# Patient Record
Sex: Male | Born: 1970 | Race: White | Hispanic: No | Smoking: Former smoker
Health system: Southern US, Community
[De-identification: ages and names within clinical notes are randomized; demographics above are authoritative.]

## PROBLEM LIST (undated history)

## (undated) DIAGNOSIS — Z8619 Personal history of other infectious and parasitic diseases: Secondary | ICD-10-CM

## (undated) DIAGNOSIS — B001 Herpesviral vesicular dermatitis: Secondary | ICD-10-CM

## (undated) DIAGNOSIS — E785 Hyperlipidemia, unspecified: Secondary | ICD-10-CM

## (undated) DIAGNOSIS — I719 Aortic aneurysm of unspecified site, without rupture: Secondary | ICD-10-CM

## (undated) DIAGNOSIS — K219 Gastro-esophageal reflux disease without esophagitis: Secondary | ICD-10-CM

## (undated) DIAGNOSIS — F419 Anxiety disorder, unspecified: Secondary | ICD-10-CM

## (undated) DIAGNOSIS — Z7289 Other problems related to lifestyle: Secondary | ICD-10-CM

## (undated) DIAGNOSIS — N200 Calculus of kidney: Secondary | ICD-10-CM

## (undated) DIAGNOSIS — E119 Type 2 diabetes mellitus without complications: Secondary | ICD-10-CM

## (undated) HISTORY — DX: Calculus of kidney: N20.0

## (undated) HISTORY — DX: Anxiety disorder, unspecified: F41.9

## (undated) HISTORY — DX: Aortic aneurysm of unspecified site, without rupture: I71.9

## (undated) HISTORY — DX: Herpesviral vesicular dermatitis: B00.1

## (undated) HISTORY — DX: Gastro-esophageal reflux disease without esophagitis: K21.9

## (undated) HISTORY — DX: Personal history of other infectious and parasitic diseases: Z86.19

## (undated) HISTORY — DX: Type 2 diabetes mellitus without complications: E11.9

## (undated) HISTORY — PX: TONSILLECTOMY: SUR1361

## (undated) HISTORY — DX: Other problems related to lifestyle: Z72.89

## (undated) HISTORY — PX: FINGER SURGERY: SHX640

## (undated) HISTORY — DX: Hyperlipidemia, unspecified: E78.5

## (undated) HISTORY — PX: APPENDECTOMY: SHX54

---

## 2001-07-15 ENCOUNTER — Encounter: Admission: RE | Admit: 2001-07-15 | Discharge: 2001-10-13 | Payer: Self-pay | Admitting: Family Medicine

## 2003-01-10 ENCOUNTER — Encounter: Admission: RE | Admit: 2003-01-10 | Discharge: 2003-04-10 | Payer: Self-pay | Admitting: Family Medicine

## 2003-04-11 ENCOUNTER — Encounter: Admission: RE | Admit: 2003-04-11 | Discharge: 2003-07-10 | Payer: Self-pay | Admitting: Family Medicine

## 2003-12-28 ENCOUNTER — Encounter: Admission: RE | Admit: 2003-12-28 | Discharge: 2003-12-28 | Payer: Self-pay | Admitting: Occupational Medicine

## 2004-05-05 DIAGNOSIS — E119 Type 2 diabetes mellitus without complications: Secondary | ICD-10-CM

## 2004-05-05 HISTORY — DX: Type 2 diabetes mellitus without complications: E11.9

## 2004-10-09 ENCOUNTER — Encounter: Admission: RE | Admit: 2004-10-09 | Discharge: 2004-10-09 | Payer: Self-pay | Admitting: Family Medicine

## 2004-10-17 ENCOUNTER — Encounter: Admission: RE | Admit: 2004-10-17 | Discharge: 2004-10-17 | Payer: Self-pay | Admitting: Family Medicine

## 2005-03-02 ENCOUNTER — Emergency Department (HOSPITAL_COMMUNITY): Admission: EM | Admit: 2005-03-02 | Discharge: 2005-03-02 | Payer: Self-pay | Admitting: Emergency Medicine

## 2005-03-31 ENCOUNTER — Encounter: Admission: RE | Admit: 2005-03-31 | Discharge: 2005-03-31 | Payer: Self-pay | Admitting: Family Medicine

## 2009-03-26 ENCOUNTER — Ambulatory Visit (HOSPITAL_COMMUNITY): Admission: RE | Admit: 2009-03-26 | Discharge: 2009-03-26 | Payer: Self-pay | Admitting: Orthopedic Surgery

## 2010-08-07 LAB — BASIC METABOLIC PANEL
BUN: 16 mg/dL (ref 6–23)
CO2: 31 mEq/L (ref 19–32)
Calcium: 9.1 mg/dL (ref 8.4–10.5)
Chloride: 104 mEq/L (ref 96–112)
Creatinine, Ser: 0.94 mg/dL (ref 0.4–1.5)
GFR calc Af Amer: >60 mL/min (ref >60)
GFR calc non Af Amer: >60 mL/min (ref >60)
Glucose, Bld: 162 mg/dL — ABNORMAL HIGH (ref 70–99)
Potassium: 4.2 mEq/L (ref 3.5–5.1)
Sodium: 140 mEq/L (ref 135–145)

## 2010-08-07 LAB — CBC
HCT: 48 % (ref 39.0–52.0)
Hemoglobin: 16.9 g/dL (ref 13.0–17.0)
MCHC: 35.1 g/dL (ref 30.0–36.0)
MCV: 98.7 fL (ref 78.0–100.0)
Platelets: 160 10*3/uL (ref 150–400)
RBC: 4.87 MIL/uL (ref 4.22–5.81)
RDW: 12.5 % (ref 11.5–15.5)
WBC: 4.6 10*3/uL (ref 4.0–10.5)

## 2010-08-07 LAB — GLUCOSE, CAPILLARY
Glucose-Capillary: 107 mg/dL — ABNORMAL HIGH (ref 70–99)
Glucose-Capillary: 108 mg/dL — ABNORMAL HIGH (ref 70–99)
Glucose-Capillary: 136 mg/dL — ABNORMAL HIGH (ref 70–99)

## 2012-10-09 DIAGNOSIS — F339 Major depressive disorder, recurrent, unspecified: Secondary | ICD-10-CM | POA: Insufficient documentation

## 2012-10-09 DIAGNOSIS — E7800 Pure hypercholesterolemia, unspecified: Secondary | ICD-10-CM | POA: Insufficient documentation

## 2012-10-09 DIAGNOSIS — E119 Type 2 diabetes mellitus without complications: Secondary | ICD-10-CM | POA: Insufficient documentation

## 2012-10-09 DIAGNOSIS — F411 Generalized anxiety disorder: Secondary | ICD-10-CM | POA: Insufficient documentation

## 2014-03-20 DIAGNOSIS — Z23 Encounter for immunization: Secondary | ICD-10-CM | POA: Insufficient documentation

## 2015-02-23 LAB — HM DIABETES EYE EXAM

## 2015-05-17 ENCOUNTER — Ambulatory Visit (INDEPENDENT_AMBULATORY_CARE_PROVIDER_SITE_OTHER): Payer: BLUE CROSS/BLUE SHIELD | Admitting: Endocrinology

## 2015-05-17 ENCOUNTER — Other Ambulatory Visit: Payer: Self-pay

## 2015-05-17 ENCOUNTER — Encounter: Payer: Self-pay | Admitting: Endocrinology

## 2015-05-17 VITALS — BP 110/74 | HR 53 | Temp 98.3°F | Ht 67.0 in | Wt 182.0 lb

## 2015-05-17 DIAGNOSIS — E785 Hyperlipidemia, unspecified: Secondary | ICD-10-CM | POA: Diagnosis not present

## 2015-05-17 DIAGNOSIS — E1165 Type 2 diabetes mellitus with hyperglycemia: Secondary | ICD-10-CM

## 2015-05-17 MED ORDER — ONETOUCH ULTRA 2 W/DEVICE KIT: PACK | Status: DC

## 2015-05-17 MED ORDER — GLIPIZIDE ER 5 MG PO TB24: 5.0000 mg | ORAL_TABLET | Freq: Every day | ORAL | Status: DC

## 2015-05-17 MED ORDER — GLUCOSE BLOOD VI STRP: ORAL_STRIP | Status: DC

## 2015-05-17 MED ORDER — SAXAGLIPTIN HCL 5 MG PO TABS: 5.0000 mg | ORAL_TABLET | Freq: Every day | ORAL | Status: DC

## 2015-05-17 MED ORDER — CANAGLIFLOZIN 100 MG PO TABS: 100.0000 mg | ORAL_TABLET | Freq: Every day | ORAL | Status: DC

## 2015-05-17 NOTE — Patient Instructions (Addendum)
Check blood sugars on waking up 3  times a week Also check blood sugars about 2 hours after a meal and do this after different meals by rotation  Recommended blood sugar levels on waking up is 90-130 and about 2 hours after meal is 130-160  Please bring your blood sugar monitor to each visit, thank you  When sugar < 100 drop Glipizide to 5mg   Walk daily

## 2015-05-17 NOTE — Progress Notes (Signed)
Patient ID: Andrew Hart, male   DOB: [birth year], 45 y.o.   MRN: [id number]           Reason for Appointment: Consultation for Type 2 Diabetes   History of Present Illness:          Date of diagnosis of type 2 diabetes mellitus: 2007 ?        Background history:  He was diagnosed with routine screening lab work He was initially tried on metformin but this caused diarrhea and he could not continue this Subsequently was treated with glipizide and apparently this was able to control his blood sugars fairly well for a few years Probably in early 2016 he was given Actos in addition to help his glucose control as A1c had gone up to 9.4 This helped transiently and A1c was 7.7 However his A1c has been otherwise over 8% since mid 2015  Recent history:   He is being seen here today in consultation because of persistently high blood sugars His Actos has been increased to 45 mg recently but his blood sugars are not improving and last A1c was 9.1  Current blood sugar patterns and problems identified:    he is checking his blood sugar very sporadically and mostly in the morning.  Also he thinks he has test strips from previous prescriptions and not clear if these are expired   His blood sugars are usually around 150 in the morning but as high as 226  He does not do any formal exercise  Has not been following any diet and tends to have relatively high fat meals especially in the evening  Non-insulin hypoglycemic drugs the patient is taking are: Glipizide ER 10 mg daily, Actos 45 mg daily      Side effects from medications have been: Diarrhea from metformin  Compliance with the medical regimen: Fair Hypoglycemia: None    Glucose monitoring:  done 0-1  times a day         Glucometer: Accu-Chek     Blood Glucose readings as above  Self-care: The diet that the patient has been following is: tries to limit sweets and drinks which sugar .     Typical meal intake: Breakfast is a breakfast bar  on weekdays, eggs, bacon and grits on weekends Lunch = sandwich, soup Dinner = pizza, chicken and pasta.  Snacks will be potato chips, nuts and cheese               Dietician visit, most recent: At diagnosis               Exercise:  none, somewhat active when he is working  Weight history: usual Previous weight 170   Wt Readings from Last 3 Encounters:  05/17/15 182 lb (82.555 kg)    Glycemic control:   No results found for: HGBA1C Lab Results  Component Value Date   CREATININE 0.94 03/22/2009         Medication List       This list is accurate as of: 05/17/15  4:21 PM.  Always use your most recent med list.               ACID REDUCER PO  Take by mouth.     aspirin 81 MG tablet  Take 81 mg by mouth daily.     canagliflozin 100 MG Tabs tablet  Commonly known as:  INVOKANA  Take 1 tablet (100 mg total) by mouth daily before breakfast.  glipiZIDE 5 MG 24 hr tablet  Commonly known as:  GLUCOTROL XL  Take 1 tablet (5 mg total) by mouth daily with breakfast.     glucose blood test strip  Commonly known as:  ONE TOUCH ULTRA TEST  Use to check blood sugar 1 time per day.     ONE TOUCH ULTRA 2 w/Device Kit  Use to check blood sugar 1 time per day.     pioglitazone 45 MG tablet  Commonly known as:  ACTOS  Take 45 mg by mouth daily.     saxagliptin HCl 5 MG Tabs tablet  Commonly known as:  ONGLYZA  Take 1 tablet (5 mg total) by mouth daily.     simvastatin 80 MG tablet  Commonly known as:  ZOCOR  Take 80 mg by mouth daily.     traZODone 100 MG tablet  Commonly known as:  DESYREL  Take 100 mg by mouth at bedtime.        Allergies: No Known Allergies  Past Medical History  Diagnosis Date  . Diabetes mellitus without complication ([place])   . Hyperlipidemia     No past surgical history on file.  Family History  Problem Relation Age of Onset  . Diabetes Mother   . Heart disease Father     MI at 22, CABG  . Diabetes Maternal Grandmother      Social History:  reports that he has quit smoking. He does not have any smokeless tobacco history on file. He reports that he drinks alcohol. His drug history is not on file.    Review of Systems    Lipid history: LDL 81, HDL 43.  Has been on simvastatin for treatment for some time   No results found for: CHOL, HDL, LDLCALC, LDLDIRECT, TRIG, CHOLHDL         Hypertension: Not present  Most recent eye exam was in 04/2015  Most recent foot exam: 1/17  Review of Systems  Constitutional: Positive for weight gain and malaise.  HENT: Negative for headaches.   Eyes: Negative for blurred vision.  Respiratory: Negative for shortness of breath.   Cardiovascular: Negative for leg swelling.  Gastrointestinal: Positive for constipation.       Has had some constipation in the last few weeks  Endocrine: Positive for abnormal weight gain. Negative for light-headedness.  Musculoskeletal: Negative for joint pain, muscle aches and muscle cramps.  Skin: Negative for rash.  Neurological: Negative for numbness and tingling.  Psychiatric/Behavioral: Negative for insomnia.    Has had weight gain after quitting smoking October 2016  LABS:    Physical Examination:  BP 110/74 mmHg  Pulse 53  Temp(Src) 98.3 F (36.8 C) (Oral)  Ht '5\' 7"'  (1.702 m)  Wt 182 lb (82.555 kg)  BMI 28.50 kg/m2  SpO2 97%  GENERAL:         Patient has minimal obesity.   HEENT:         Eye exam shows normal external appearance. Fundus exam shows no retinopathy.  Oral exam shows normal mucosa .  NECK:   There is no lymphadenopathy Thyroid is not enlarged and no nodules felt.  Carotids are normal to palpation and no bruit heard LUNGS:         Chest is symmetrical. Lungs are clear to auscultation.Marland Kitchen   HEART:         Heart sounds:  S1 and S2 are normal. No murmur or click heard., no S3 or S4.   ABDOMEN:   There  is no distention present. Liver and spleen are not palpable. No other mass or tenderness present.    NEUROLOGICAL:   Ankle jerks are absent bilaterally.    Diabetic Foot Exam - Simple   Simple Foot Form  Diabetic Foot exam was performed with the following findings:  Yes   Visual Inspection  No deformities, no ulcerations, no other skin breakdown bilaterally:  Yes  Sensation Testing  Intact to touch and monofilament testing bilaterally:  Yes  Pulse Check  Posterior Tibialis and Dorsalis pulse intact bilaterally:  Yes  See comments:  Yes  Comments  Absent left posterior tibialis.  Left anterior tibialis difficult to palpate             Vibration sense is moderately  reduced in distal first toes. MUSCULOSKELETAL:  There is no swelling or deformity of the peripheral joints. Spine is normal to inspection.   EXTREMITIES:     There is no edema. No skin lesions present.Marland Kitchen SKIN:       No rash or lesions of concern.        ASSESSMENT:  Diabetes type 2, uncontrolled, BMI 28   He has had persistently poor controlled blood sugars for at least 1-1/2 years with using glipizide and Actos May have to some degree of insulin deficiency However he has also gained weight more recently He has not had any consultation with  dietitian since diagnosis and needs improvement in his meal plan as well as more general diabetes education Needs to start structured exercise program with walking at least  Complications: None evident  History of hyperlipidemia, well controlled  PLAN:    Trial of Invokana 100 mg daily. Discussed action of SGLT 2 drugs on lowering glucose by decreasing kidney absorption of glucose, benefits of weight loss and lower blood pressure, possible side effects including UTI, candidiasis and taking the medication in the morning   Onglyza 5 mg daily.  Discussed mechanism of action and benefits  Consultation with dietitian  Start walking program  Start using One Touch ultra 2 monitor, prescription for test strips and meter given, he may have been using expired test strips at  home  Check blood sugars periodically after meals and every other day or so in the morning.  New One Touch 2 monitor prescribed.  Follow-up in 3 weeks  May need to reduce glipizide to 5 mg when blood sugars are below 100, discussed possibly tapering this off long-term  Patient Instructions  Check blood sugars on waking up 3  times a week Also check blood sugars about 2 hours after a meal and do this after different meals by rotation  Recommended blood sugar levels on waking up is 90-130 and about 2 hours after meal is 130-160  Please bring your blood sugar monitor to each visit, thank you  When sugar < 100 drop Glipizide to 66m  Walk daily    Jayro Mcmath 05/17/2015, 4:21 PM   Note: This office note was prepared with Dragon voice recognition system technology. Any transcriptional errors that result from this process are unintentional.

## 2015-05-18 DIAGNOSIS — E1169 Type 2 diabetes mellitus with other specified complication: Secondary | ICD-10-CM | POA: Insufficient documentation

## 2015-05-18 DIAGNOSIS — E785 Hyperlipidemia, unspecified: Secondary | ICD-10-CM | POA: Insufficient documentation

## 2015-05-18 DIAGNOSIS — E1165 Type 2 diabetes mellitus with hyperglycemia: Secondary | ICD-10-CM | POA: Insufficient documentation

## 2015-06-04 ENCOUNTER — Other Ambulatory Visit (INDEPENDENT_AMBULATORY_CARE_PROVIDER_SITE_OTHER): Payer: BLUE CROSS/BLUE SHIELD

## 2015-06-04 DIAGNOSIS — E1165 Type 2 diabetes mellitus with hyperglycemia: Secondary | ICD-10-CM

## 2015-06-04 LAB — COMPREHENSIVE METABOLIC PANEL
ALT: 22 U/L (ref 0–53)
AST: 19 U/L (ref 0–37)
Albumin: 4.3 g/dL (ref 3.5–5.2)
Alkaline Phosphatase: 78 U/L (ref 39–117)
BILIRUBIN TOTAL: 0.5 mg/dL (ref 0.2–1.2)
BUN: 17 mg/dL (ref 6–23)
CALCIUM: 9.1 mg/dL (ref 8.4–10.5)
CO2: 25 meq/L (ref 19–32)
Chloride: 105 mEq/L (ref 96–112)
Creatinine, Ser: 0.99 mg/dL (ref 0.40–1.50)
GFR: 86.76 mL/min (ref >60.00)
Glucose, Bld: 149 mg/dL — ABNORMAL HIGH (ref 70–99)
Potassium: 4.8 mEq/L (ref 3.5–5.1)
Sodium: 138 mEq/L (ref 135–145)
Total Protein: 6.8 g/dL (ref 6.0–8.3)

## 2015-06-05 LAB — FRUCTOSAMINE: Fructosamine: 310 umol/L — ABNORMAL HIGH (ref 0–285)

## 2015-06-07 ENCOUNTER — Encounter: Payer: BLUE CROSS/BLUE SHIELD | Attending: Endocrinology | Admitting: Dietician

## 2015-06-07 ENCOUNTER — Telehealth: Payer: Self-pay | Admitting: Endocrinology

## 2015-06-07 ENCOUNTER — Encounter: Payer: Self-pay | Admitting: Dietician

## 2015-06-07 ENCOUNTER — Encounter: Payer: Self-pay | Admitting: Endocrinology

## 2015-06-07 ENCOUNTER — Ambulatory Visit (INDEPENDENT_AMBULATORY_CARE_PROVIDER_SITE_OTHER): Payer: BLUE CROSS/BLUE SHIELD | Admitting: Endocrinology

## 2015-06-07 ENCOUNTER — Other Ambulatory Visit: Payer: Self-pay | Admitting: *Deleted

## 2015-06-07 VITALS — BP 110/72 | HR 64 | Temp 98.0°F | Resp 16 | Ht 67.0 in | Wt 180.6 lb

## 2015-06-07 VITALS — Ht 67.0 in | Wt 180.0 lb

## 2015-06-07 DIAGNOSIS — E1165 Type 2 diabetes mellitus with hyperglycemia: Secondary | ICD-10-CM | POA: Diagnosis not present

## 2015-06-07 DIAGNOSIS — E118 Type 2 diabetes mellitus with unspecified complications: Secondary | ICD-10-CM

## 2015-06-07 DIAGNOSIS — IMO0002 Reserved for concepts with insufficient information to code with codable children: Secondary | ICD-10-CM

## 2015-06-07 DIAGNOSIS — Z794 Long term (current) use of insulin: Secondary | ICD-10-CM

## 2015-06-07 MED ORDER — BAYER MICROLET LANCETS MISC: Status: DC

## 2015-06-07 MED ORDER — GLUCOSE BLOOD VI STRP: ORAL_STRIP | Status: DC

## 2015-06-07 MED ORDER — BAYER CONTOUR NEXT MONITOR W/DEVICE KIT: PACK | Status: DC

## 2015-06-07 NOTE — Telephone Encounter (Signed)
Pt needs a rx for the test meter and strips the contour next

## 2015-06-07 NOTE — Patient Instructions (Signed)
Plan:  Aim for 60 Carb Choices per meal (60 grams) +/- 1 either way  Aim for 0-2 Carbs per snack if hungry  Include protein in moderation with your meals and snacks Consider reading food labels for Total Carbohydrate and Fat Grams of foods Consider  increasing your activity level by walking for 30 minutes daily as tolerated Consider checking BG at alternate times per day as directed by MD  Consider taking medication as directed by MD

## 2015-06-07 NOTE — Progress Notes (Signed)
Patient ID: Andrew Hart, male   DOB: 14-Mar-1971, 45 y.o.   MRN: [id number]           Reason for Appointment: Consultation for Type 2 Diabetes   History of Present Illness:          Date of diagnosis of type 2 diabetes mellitus: 2007 ?        Background history:  He was diagnosed with routine screening lab work He was initially tried on metformin but this caused diarrhea and he could not continue this Subsequently was treated with glipizide and apparently this was able to control his blood sugars fairly well for a few years Probably in early 2016 he was given Actos in addition to help his glucose control as A1c had gone up to 9.4 This helped transiently and A1c was 7.7 However his A1c has been otherwise over 8% since mid 2015  Recent history:    Because of persistently high blood sugars on his initial consultation (A1c was 9.1) he was started on Invokana and Onglyza in addition to his Actos and glipizide in 1/16  He has not brought his monitor for download today  Current blood sugar patterns and problems identified:   He is checking his blood sugars mostly in the morning again  He thinks his morning sugars are progressively better and down to 95 today  Has not checked his nonfasting readings recently but a few days ago it was over 200 in the afternoon  Postprandial glucose after breakfast was 149  He has seen the dietitian, previously had been eating relatively high fat meals  He has not had any side effects from [place] except more frequent urination and thirst during the day  Has lost 2 pounds   He does not do any formal exercise   He was told to reduce his glipizide ER down to mg once blood sugars are significantly better and he has done that a few days ago   Non-insulin hypoglycemic drugs the patient is taking are: Glipizide ER 5 mg daily, Actos 45 mg daily      Side effects from medications have been: Diarrhea from metformin  Compliance with the medical  regimen: Fair Hypoglycemia: None but right at suppertime he may occasionally feels shaky     Glucose monitoring:  done 0-1  times a day         Glucometer:  Generic    Blood Glucose readings  by recall  Mean values apply above for all meters except median for One Touch  PRE-MEAL Fasting Lunch Dinner Bedtime Overall  Glucose range: 95, 140  206    Mean/median:        Self-care: The diet that the patient has been following is: tries to limit sweets and drinks which sugar .     Typical meal intake: Breakfast is a breakfast bar on weekdays, eggs, bacon and grits on weekends Lunch = sandwich, soup Dinner = pizza, chicken and pasta.  Snacks will be potato chips, nuts and cheese               Dietician visit, most recent: At diagnosis               Exercise:  none, somewhat active when he is working  Weight history: usual Previous weight 170   Wt Readings from Last 3 Encounters:  06/07/15 180 lb 9.6 oz (81.92 kg)  06/07/15 180 lb (81.647 kg)  05/17/15 182 lb (82.555 kg)    Glycemic control:  No results found for: HGBA1C Lab Results  Component Value Date   CREATININE 0.99 06/04/2015    Lab on 06/04/2015  Component Date Value Ref Range Status  . Sodium 06/04/2015 138  135 - 145 mEq/L Final  . Potassium 06/04/2015 4.8  3.5 - 5.1 mEq/L Final  . Chloride 06/04/2015 105  96 - 112 mEq/L Final  . CO2 06/04/2015 25  19 - 32 mEq/L Final  . Glucose, Bld 06/04/2015 149* 70 - 99 mg/dL Final  . BUN 06/04/2015 17  6 - 23 mg/dL Final  . Creatinine, Ser 06/04/2015 0.99  0.40 - 1.50 mg/dL Final  . Total Bilirubin 06/04/2015 0.5  0.2 - 1.2 mg/dL Final  . Alkaline Phosphatase 06/04/2015 78  39 - 117 U/L Final  . AST 06/04/2015 19  0 - 37 U/L Final  . ALT 06/04/2015 22  0 - 53 U/L Final  . Total Protein 06/04/2015 6.8  6.0 - 8.3 g/dL Final  . Albumin 06/04/2015 4.3  3.5 - 5.2 g/dL Final  . Calcium 06/04/2015 9.1  8.4 - 10.5 mg/dL Final  . GFR 06/04/2015 86.76  >60.00 mL/min Final  .  Fructosamine 06/04/2015 310* 0 - 285 umol/L Final   Comment: Published reference interval for apparently healthy subjects between age 89 and 31 is 10 - 285 umol/L and in a poorly controlled diabetic population is 228 - 563 umol/L with a mean of 396 umol/L.         Medication List       This list is accurate as of: 06/07/15  3:26 PM.  Always use your most recent med list.               ACID REDUCER PO  Take by mouth.     acyclovir 200 MG capsule  Commonly known as:  ZOVIRAX  Take 200 mg by mouth as needed.     aspirin 81 MG tablet  Take 81 mg by mouth daily.     BAYER CONTOUR NEXT MONITOR w/Device Kit  Use to check blood sugar once a day     BAYER MICROLET LANCETS lancets  Use as instructed to check blood sugar once a day dx E11.65     canagliflozin 100 MG Tabs tablet  Commonly known as:  INVOKANA  Take 1 tablet (100 mg total) by mouth daily before breakfast.     glipiZIDE 5 MG 24 hr tablet  Commonly known as:  GLUCOTROL XL  Take 1 tablet (5 mg total) by mouth daily with breakfast.     glucose blood test strip  Commonly known as:  BAYER CONTOUR NEXT TEST  Use as instructed to check blood sugar once a day dx code E11.65     pioglitazone 45 MG tablet  Commonly known as:  ACTOS  Take 45 mg by mouth daily. Reported on 06/07/2015     saxagliptin HCl 5 MG Tabs tablet  Commonly known as:  ONGLYZA  Take 1 tablet (5 mg total) by mouth daily.     simvastatin 80 MG tablet  Commonly known as:  ZOCOR  Take 80 mg by mouth daily.     traZODone 150 MG tablet  Commonly known as:  DESYREL  Take by mouth at bedtime. Takes 1/2 tablet at night        Allergies: No Known Allergies  Past Medical History  Diagnosis Date  . Diabetes mellitus without complication ([place])   . Hyperlipidemia     No past surgical history on file.  Family History  Problem Relation Age of Onset  . Diabetes Mother   . Heart disease Father     MI at 36, CABG  . Diabetes Maternal Grandmother      Social History:  reports that he has quit smoking. He does not have any smokeless tobacco history on file. He reports that he drinks alcohol. His drug history is not on file.    Review of Systems    Lipid history: LDL 81, HDL 43.  Has been on simvastatin for treatment for some time   No results found for: CHOL, HDL, LDLCALC, LDLDIRECT, TRIG, CHOLHDL         Hypertension: Not present  Most recent eye exam was in 04/2015  Most recent foot exam: 1/17  Review of Systems  Has had weight gain after quitting smoking October 2016     Physical Examination:  BP 110/72 mmHg  Pulse 64  Temp(Src) 98 F (36.7 C)  Resp 16  Ht _0  (1.702 m)  Wt 180 lb 9.6 oz (81.92 kg)  BMI 28.28 kg/m2  SpO2 96%  ASSESSMENT:  Diabetes type 2, uncontrolled, BMI 28   He has had persistently poor controlled blood sugars for at least 1-1/2 years with using glipizide and Actos With starting Onglyza and Invokana his blood sugars appear to be improving progressively He has now started taking 5 mg glipizide also instead of 10 Fructosamine is still mildly increased   PLAN:    continue same regimen  He will check with his insurance company which meter they prefer  Discussed timing of glucose monitoring and frequency   Check A1c on the next visit  Patient Instructions  Check blood sugars on waking up  2-3 times a week Also check blood sugars about 2 hours after a meal and do this after different meals by rotation  Recommended blood sugar levels on waking up is 90-130 and about 2 hours after meal is 130-160  Please bring your blood sugar monitor to each visit, thank you  Call if sugars get <80   Brands: Contour, Accucheck, Freestyle    Rilea Arutyunyan 06/07/2015, 3:26 PM   Note: This office note was prepared with Dragon voice recognition system technology. Any transcriptional errors that result from this process are unintentional.

## 2015-06-07 NOTE — Patient Instructions (Addendum)
Check blood sugars on waking up  2-3 times a week Also check blood sugars about 2 hours after a meal and do this after different meals by rotation  Recommended blood sugar levels on waking up is 90-130 and about 2 hours after meal is 130-160  Please bring your blood sugar monitor to each visit, thank you  Call if sugars get <80   Brands: Contour, Accucheck, Freestyle

## 2015-06-07 NOTE — Telephone Encounter (Signed)
rx sent

## 2015-06-07 NOTE — Telephone Encounter (Signed)
Please call to [facility]

## 2015-06-08 NOTE — Progress Notes (Signed)
Diabetes Self-Management Education  Visit Type: First/Initial  Appt. Start Time: 1100 Appt. End Time: 1215  06/08/2015  [name], identified by name and date of birth, is a 45 y.o. male with a diagnosis of Diabetes: Type 2 (for 10 years). He quit smoking in August 2016.  He has gained 10 lbs since that time.  He does not want to go on insulin.  His fructosamine was 310 (06/04/15).  Patient lives with his wife and 56 and 61 yo daughters.  He does the shopping and cooking.  He reports just receiving a "Slow Cooker Recipes with Diabetes" cookbook.  They eat out twice per week.  He modifies recipes for fat at times.  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  He does not get any regular exercise outside of the physical activity of work.  ASSESSMENT  Height  (1.702 m), weight 180 lb (81.647 kg). Body mass index is 28.19 kg/(m^2).       Diabetes Self-Management Education - 06/07/15 1104    Visit Information   Visit Type First/Initial   Initial Visit   Diabetes Type Type 2  for 10 years   Are you currently following a meal plan? Yes   Are you taking your medications as prescribed? Yes   Date Diagnosed 2006   Health Coping   How would you rate your overall health? Good   Psychosocial Assessment   Patient Belief/Attitude about Diabetes Motivated to manage diabetes   Self-care barriers None   Self-management support Doctor's office;Family   Other persons present Patient   Patient Concerns Nutrition/Meal planning;Glycemic Control   Special Needs None   Preferred Learning Style No preference indicated   Learning Readiness Ready   How often do you need to have someone help you when you read instructions, pamphlets, or other written materials from your doctor or pharmacy? 1 - Never   What is the last grade level you completed in school? 2 college   Complications   Last HgB A1C per patient/outside source 9.4 %  12/16   How often do  you check your blood sugar? 1-2 times/day   Fasting Blood glucose range (mg/dL) 95-621;308-657   Postprandial Blood glucose range (mg/dL) >846   Number of hypoglycemic episodes per month 0   Number of hyperglycemic episodes per week 14   Can you tell when your blood sugar is high? Yes   What do you do if your blood sugar is high? nothing   Have you had a dilated eye exam in the past 12 months? Yes   Have you had a dental exam in the past 12 months? Yes   Are you checking your feet? Yes   How many days per week are you checking your feet? 7   Dietary Intake   Breakfast Breakfast bar or biscuit or fruit OR bacon, eggs, biscuit or sausage and gravy biscuit  8:15   Snack (morning) occasional trail mix   Lunch leftovers or cold cut sandwich or chicken salad sandwich or grilled cheese on White wheat bread with chips or cheeseits OR cheeseburger and fries  11:30-3:30   Snack (afternoon) snacks while cooking (fruit, chips), occasional candy bar if feels that blood sugar is low.   Dinner Chartered certified accountant, corn or peas, grean beans, salad or Pizza once per week  6:30-7   Snack (evening) none   Beverage(s) water, diet soda, regular soda or juice to treat blood sugar only, beer (6  on occasion)   Exercise   Exercise Type Light (walking / raking leaves)  on job   Patient Education   Previous Diabetes Education Yes (please comment)  when first diagnosed   Disease state  Definition of diabetes, type 1 and 2, and the diagnosis of diabetes;Factors that contribute to the development of diabetes   Nutrition management  Role of diet in the treatment of diabetes and the relationship between the three main macronutrients and blood glucose level;Food label reading, portion sizes and measuring food.;Meal options for control of blood glucose level and chronic complications.   Physical activity and exercise  Role of exercise on diabetes management, blood pressure control and cardiac health.;Helped patient  identify appropriate exercises in relation to his/her diabetes, diabetes complications and other health issue.   Monitoring Identified appropriate SMBG and/or A1C goals.;Yearly dilated eye exam;Daily foot exams   Acute complications Taught treatment of hypoglycemia - the 15 rule.   Chronic complications Relationship between chronic complications and blood glucose control;Retinopathy and reason for yearly dilated eye exams   Psychosocial adjustment Role of stress on diabetes;Worked with patient to identify barriers to care and solutions   Individualized Goals (developed by patient)   Nutrition General guidelines for healthy choices and portions discussed;Follow meal plan discussed   Physical Activity Exercise 5-7 days per week;30 minutes per day   Medications take my medication as prescribed   Monitoring  test my blood glucose as discussed   Reducing Risk examine blood glucose patterns   Outcomes   Expected Outcomes Demonstrated interest in learning. Expect positive outcomes   Future DMSE PRN   Program Status Not Completed      Individualized Plan for Diabetes Self-Management Training:   Learning Objective:  Patient will have a greater understanding of diabetes self-management. Patient education plan is to attend individual and/or group sessions per assessed needs and concerns.   Plan:   Patient Instructions  Plan:  Aim for 60 Carb Choices per meal (60 grams) +/- 1 either way  Aim for 0-2 Carbs per snack if hungry  Include protein in moderation with your meals and snacks Consider reading food labels for Total Carbohydrate and Fat Grams of foods Consider  increasing your activity level by walking for 30 minutes daily as tolerated Consider checking BG at alternate times per day as directed by MD  Consider taking medication as directed by MD    Expected Outcomes:  Demonstrated interest in learning. Expect positive outcomes  Education material provided: Living Well with Diabetes,  Food label handouts, A1C conversion sheet, Meal plan card, My Plate and Snack sheet, breakfast  If problems or questions, patient to contact team via:  Phone and Email  Future DSME appointment: PRN

## 2015-07-17 ENCOUNTER — Other Ambulatory Visit (INDEPENDENT_AMBULATORY_CARE_PROVIDER_SITE_OTHER): Payer: BLUE CROSS/BLUE SHIELD

## 2015-07-17 DIAGNOSIS — E1165 Type 2 diabetes mellitus with hyperglycemia: Secondary | ICD-10-CM

## 2015-07-17 LAB — COMPREHENSIVE METABOLIC PANEL
ALT: 23 U/L (ref 0–53)
AST: 19 U/L (ref 0–37)
Albumin: 4.4 g/dL (ref 3.5–5.2)
Alkaline Phosphatase: 82 U/L (ref 39–117)
BUN: 24 mg/dL — ABNORMAL HIGH (ref 6–23)
CO2: 23 mEq/L (ref 19–32)
Calcium: 9.4 mg/dL (ref 8.4–10.5)
Chloride: 106 mEq/L (ref 96–112)
Creatinine, Ser: 0.86 mg/dL (ref 0.40–1.50)
GFR: 102.01 mL/min (ref >60.00)
Glucose, Bld: 162 mg/dL — ABNORMAL HIGH (ref 70–99)
Potassium: 4.3 mEq/L (ref 3.5–5.1)
Sodium: 139 mEq/L (ref 135–145)
TOTAL PROTEIN: 6.8 g/dL (ref 6.0–8.3)
Total Bilirubin: 0.7 mg/dL (ref 0.2–1.2)

## 2015-07-17 LAB — LIPID PANEL
CHOLESTEROL: 133 mg/dL (ref 0–200)
HDL: 33.4 mg/dL — ABNORMAL LOW (ref >39.00)
LDL CALC: 80 mg/dL (ref 0–99)
NonHDL: 100.07
TRIGLYCERIDES: 99 mg/dL (ref 0.0–149.0)
Total CHOL/HDL Ratio: 4
VLDL: 19.8 mg/dL (ref 0.0–40.0)

## 2015-07-17 LAB — MICROALBUMIN / CREATININE URINE RATIO
Creatinine,U: 66.5 mg/dL
MICROALB/CREAT RATIO: 1.2 mg/g (ref 0.0–30.0)
Microalb, Ur: 0.8 mg/dL (ref 0.0–1.9)

## 2015-07-17 LAB — HEMOGLOBIN A1C: Hgb A1c MFr Bld: 8.2 % — ABNORMAL HIGH (ref 4.6–6.5)

## 2015-07-20 ENCOUNTER — Encounter: Payer: Self-pay | Admitting: Endocrinology

## 2015-07-20 ENCOUNTER — Other Ambulatory Visit: Payer: Self-pay | Admitting: *Deleted

## 2015-07-20 ENCOUNTER — Ambulatory Visit (INDEPENDENT_AMBULATORY_CARE_PROVIDER_SITE_OTHER): Payer: BLUE CROSS/BLUE SHIELD | Admitting: Endocrinology

## 2015-07-20 VITALS — BP 110/78 | HR 59 | Temp 98.2°F | Resp 14 | Ht 67.0 in | Wt 176.2 lb

## 2015-07-20 DIAGNOSIS — E785 Hyperlipidemia, unspecified: Secondary | ICD-10-CM | POA: Diagnosis not present

## 2015-07-20 DIAGNOSIS — E1169 Type 2 diabetes mellitus with other specified complication: Secondary | ICD-10-CM

## 2015-07-20 DIAGNOSIS — E1165 Type 2 diabetes mellitus with hyperglycemia: Secondary | ICD-10-CM | POA: Diagnosis not present

## 2015-07-20 MED ORDER — CANAGLIFLOZIN 300 MG PO TABS: 300.0000 mg | ORAL_TABLET | Freq: Every day | ORAL | Status: DC

## 2015-07-20 MED ORDER — PIOGLITAZONE HCL 30 MG PO TABS: 30.0000 mg | ORAL_TABLET | Freq: Every day | ORAL | Status: DC

## 2015-07-20 MED ORDER — BAYER MICROLET LANCETS MISC: Status: DC

## 2015-07-20 MED ORDER — GLUCOSE BLOOD VI STRP: ORAL_STRIP | Status: DC

## 2015-07-20 NOTE — Patient Instructions (Addendum)
Check blood sugars on waking up 2-3  times a week  Also check blood sugars about 2 hours after a meal and do this after different meals by rotation  Recommended blood sugar levels on waking up is 90-130 and about 2 hours after meal is 130-160  Please bring your blood sugar monitor to each visit, thank you  Invokana 200mg  and then 300mg   Actos 30 mg daily  Exercise daily

## 2015-07-20 NOTE — Progress Notes (Signed)
Patient ID: Andrew Hart, male   DOB: 10-23-1970, 45 y.o.   MRN: [id number]           Reason for Appointment: Follow-up for Type 2 Diabetes   History of Present Illness:          Date of diagnosis of type 2 diabetes mellitus: 2007 ?        Background history:  He was diagnosed with routine screening lab work He was initially tried on metformin but this caused diarrhea and he could not continue this Subsequently was treated with glipizide and apparently this was able to control his blood sugars fairly well for a few years Probably in early 2016 he was given Actos in addition to help his glucose control as A1c had gone up to 9.4 This helped transiently and A1c was 7.7 However his A1c has been otherwise over 8% since mid 2015  Recent history:   Because of persistently high blood sugars on his initial consultation (A1c was 9.1) he was started on Invokana and Onglyza in addition to his Actos and glipizide in 1/16 However since he was reluctant to take Actos this was stopped Also glipizide ER was reduced down to 5 mg His A1c is still high at 8.2%  Current blood sugar patterns and problems identified:   He is checking his blood sugars infrequently  Fasting readings are mildly increased but not consistently high  Blood sugars are mostly high after supper mostly in the morning again  He has been seen with her dietitian and is slowly making some changes, previously had high fat meals  He does not do any formal exercise and has not been motivated to do much, he thinks he will be active when he gets back into  a busy year work schedule  He has not had any hypoglycemic symptoms with reduced glipizide  Nonfasting lab glucose was 162  Non-insulin hypoglycemic drugs the patient is taking are: Glipizide ER 5 mg daily, Actos 45 mg daily      Side effects from medications have been: Diarrhea from metformin  Compliance with the medical regimen: Fair Hypoglycemia: None   Glucose  monitoring:  done 0-1  times a day         Glucometer:  Contour    Blood Glucose readings  by download  Mean values apply above for all meters except median for One Touch  PRE-MEAL Fasting Lunch Dinner Bedtime Overall  Glucose range: 122-179  145   171-309    Mean/median: 140    219   169      Self-care: The diet that the patient has been following is: tries to limit sweets and drinks which sugar .     Typical meal intake: Breakfast is a breakfast bar on weekdays, eggs, bacon and grits on weekends Lunch = sandwich, soup Dinner = pizza, chicken and pasta.  Snacks will be potato chips, nuts and cheese               Dietician visit, most recent: At diagnosis               Exercise:  none, somewhat active when he is working  Weight history: usual Previous weight 170   Wt Readings from Last 3 Encounters:  07/20/15 176 lb 3.2 oz (79.924 kg)  06/07/15 180 lb 9.6 oz (81.92 kg)  06/07/15 180 lb (81.647 kg)    Glycemic control:    Lab Results  Component Value Date   HGBA1C 8.2* 07/17/2015  Lab Results  Component Value Date   MICROALBUR 0.8 07/17/2015   LDLCALC 80 07/17/2015   CREATININE 0.86 07/17/2015    Lab on 07/17/2015  Component Date Value Ref Range Status  . Hgb A1c MFr Bld 07/17/2015 8.2* 4.6 - 6.5 % Final   Glycemic Control Guidelines for People with Diabetes:Non Diabetic:  <6%Goal of Therapy: <7%Additional Action Suggested:  >8%   . Sodium 07/17/2015 139  135 - 145 mEq/L Final  . Potassium 07/17/2015 4.3  3.5 - 5.1 mEq/L Final  . Chloride 07/17/2015 106  96 - 112 mEq/L Final  . CO2 07/17/2015 23  19 - 32 mEq/L Final  . Glucose, Bld 07/17/2015 162* 70 - 99 mg/dL Final  . BUN 07/17/2015 24* 6 - 23 mg/dL Final  . Creatinine, Ser 07/17/2015 0.86  0.40 - 1.50 mg/dL Final  . Total Bilirubin 07/17/2015 0.7  0.2 - 1.2 mg/dL Final  . Alkaline Phosphatase 07/17/2015 82  39 - 117 U/L Final  . AST 07/17/2015 19  0 - 37 U/L Final  . ALT 07/17/2015 23  0 - 53 U/L Final  .  Total Protein 07/17/2015 6.8  6.0 - 8.3 g/dL Final  . Albumin 07/17/2015 4.4  3.5 - 5.2 g/dL Final  . Calcium 07/17/2015 9.4  8.4 - 10.5 mg/dL Final  . GFR 07/17/2015 102.01  >60.00 mL/min Final  . Microalb, Ur 07/17/2015 0.8  0.0 - 1.9 mg/dL Final  . Creatinine,U 07/17/2015 66.5   Final  . Microalb Creat Ratio 07/17/2015 1.2  0.0 - 30.0 mg/g Final  . Cholesterol 07/17/2015 133  0 - 200 mg/dL Final   ATP III Classification       Desirable:  < 200 mg/dL               Borderline High:  200 - 239 mg/dL          High:  > = 240 mg/dL  . Triglycerides 07/17/2015 99.0  0.0 - 149.0 mg/dL Final   Normal:  <150 mg/dLBorderline High:  150 - 199 mg/dL  . HDL 07/17/2015 33.40* >39.00 mg/dL Final  . VLDL 07/17/2015 19.8  0.0 - 40.0 mg/dL Final  . LDL Cholesterol 07/17/2015 80  0 - 99 mg/dL Final  . Total CHOL/HDL Ratio 07/17/2015 4   Final                  Men          Women1/2 Average Risk     3.4          3.3Average Risk          5.0          4.42X Average Risk          9.6          7.13X Average Risk          15.0          11.0                      . NonHDL 07/17/2015 100.07   Final   NOTE:  Non-HDL goal should be 30 mg/dL higher than patient's LDL goal (i.e. LDL goal of < 70 mg/dL, would have non-HDL goal of < 100 mg/dL)        Medication List       This list is accurate as of: 07/20/15 11:59 PM.  Always use your most recent med list.  ACID REDUCER PO  Take by mouth.     acyclovir 200 MG capsule  Commonly known as:  ZOVIRAX  Take 200 mg by mouth as needed.     aspirin 81 MG tablet  Take 81 mg by mouth daily.     BAYER CONTOUR NEXT MONITOR w/Device Kit  Use to check blood sugar once a day     BAYER MICROLET LANCETS lancets  Use as instructed to check blood sugar twice a day dx E11.65     canagliflozin 300 MG Tabs tablet  Commonly known as:  INVOKANA  Take 1 tablet (300 mg total) by mouth daily before breakfast.     glipiZIDE 5 MG 24 hr tablet  Commonly known as:   GLUCOTROL XL  Take 1 tablet (5 mg total) by mouth daily with breakfast.     glucose blood test strip  Commonly known as:  BAYER CONTOUR NEXT TEST  Use as instructed to check blood sugar twice a day dx code E11.65     pioglitazone 30 MG tablet  Commonly known as:  ACTOS  Take 1 tablet (30 mg total) by mouth daily.     saxagliptin HCl 5 MG Tabs tablet  Commonly known as:  ONGLYZA  Take 1 tablet (5 mg total) by mouth daily.     simvastatin 80 MG tablet  Commonly known as:  ZOCOR  Take 80 mg by mouth daily.     traZODone 150 MG tablet  Commonly known as:  DESYREL  Take by mouth at bedtime. Takes 1/2 tablet at night        Allergies: No Known Allergies  Past Medical History  Diagnosis Date  . Diabetes mellitus without complication ([place])   . Hyperlipidemia     No past surgical history on file.  Family History  Problem Relation Age of Onset  . Diabetes Mother   . Heart disease Father     MI at 60, CABG  . Diabetes Maternal Grandmother     Social History:  reports that he has quit smoking. He does not have any smokeless tobacco history on file. He reports that he drinks alcohol. His drug history is not on file.    Review of Systems    Lipid history: LDL 81, HDL 43.  Has been on simvastatin for treatment for some time Recent lipids look good except for low HDL    Lab Results  Component Value Date   CHOL 133 07/17/2015   HDL 33.40* 07/17/2015   LDLCALC 80 07/17/2015   TRIG 99.0 07/17/2015   CHOLHDL 4 07/17/2015            Most recent eye exam was in 04/2015  Most recent foot exam: 1/17  Review of Systems      Physical Examination:  BP 110/78 mmHg  Pulse 59  Temp(Src) 98.2 F (36.8 C)  Resp 14  Ht 5' 7" (1.702 m)  Wt 176 lb 3.2 oz (79.924 kg)  BMI 27.59 kg/m2  SpO2 95%  ASSESSMENT:  Diabetes type 2, uncontrolled, BMI 28   He has had Only some improvement in his blood sugar control with starting low-dose Invokana and Onglyza A1c is still  high at 8.2 He has mostly postprandial hyperglycemia He does need to continue improving diet and start an exercise program Discussed that he may be getting some insulin deficiency and will need to increase his therapeutic regimen Discussed safety of Actos and long-term benefits on cardiovascular risk and that it may help his sugar  again along with improvement in HDL  DYSLIPIDEMIA: LDL is controlled but HDL is relatively low  PLAN:    Restart Actos 30 mg daily  Increase Invokana to 200 mg with current supply and then 300 mg daily in the morning  Follow instructions consistently given by dietitian  Regular exercise at least 5 days a week  No change in glipizide as yet  He can continue Onglyza, his insurance does not cover any combination drugs; also consider Victoza but he is refusing to consider any injectable drugs which would be more effective  Discussed blood sugar targets at various times   Patient Instructions  Check blood sugars on waking up 2-3  times a week  Also check blood sugars about 2 hours after a meal and do this after different meals by rotation  Recommended blood sugar levels on waking up is 90-130 and about 2 hours after meal is 130-160  Please bring your blood sugar monitor to each visit, thank you  Invokana 279m and then 3065m Actos 30 mg daily  Exercise daily    Counseling time on subjects discussed above is over 50% of today's 25 minute visit  Lilli Dewald 07/22/2015, 3:32 PM   Note: This office note was prepared with Dragon voice recognition system technology. Any transcriptional errors that result from this process are unintentional.

## 2015-07-22 NOTE — Addendum Note (Signed)
Addended by: Reather LittlerKUMAR, Yezenia Fredrick on: 07/22/2015 03:41 PM   Modules accepted: Level of Service

## 2015-07-24 ENCOUNTER — Telehealth: Payer: Self-pay | Admitting: Endocrinology

## 2015-07-24 NOTE — Telephone Encounter (Signed)
[facility] 55 pages to Dr. Lucianne MussKumar

## 2015-08-06 ENCOUNTER — Ambulatory Visit (INDEPENDENT_AMBULATORY_CARE_PROVIDER_SITE_OTHER): Payer: BLUE CROSS/BLUE SHIELD | Admitting: Family Medicine

## 2015-08-06 ENCOUNTER — Ambulatory Visit: Payer: BLUE CROSS/BLUE SHIELD | Admitting: Family Medicine

## 2015-08-06 ENCOUNTER — Encounter: Payer: Self-pay | Admitting: Family Medicine

## 2015-08-06 VITALS — BP 142/80 | HR 64 | Temp 98.2°F | Ht 67.0 in | Wt 174.5 lb

## 2015-08-06 DIAGNOSIS — F4322 Adjustment disorder with anxiety: Secondary | ICD-10-CM | POA: Diagnosis not present

## 2015-08-06 DIAGNOSIS — E785 Hyperlipidemia, unspecified: Secondary | ICD-10-CM

## 2015-08-06 DIAGNOSIS — K219 Gastro-esophageal reflux disease without esophagitis: Secondary | ICD-10-CM

## 2015-08-06 DIAGNOSIS — E1165 Type 2 diabetes mellitus with hyperglycemia: Secondary | ICD-10-CM

## 2015-08-06 DIAGNOSIS — G4700 Insomnia, unspecified: Secondary | ICD-10-CM | POA: Diagnosis not present

## 2015-08-06 MED ORDER — BUSPIRONE HCL 5 MG PO TABS: 5.0000 mg | ORAL_TABLET | Freq: Two times a day (BID) | ORAL | Status: DC

## 2015-08-06 NOTE — Assessment & Plan Note (Signed)
Chronic, continue simvastatin.

## 2015-08-06 NOTE — Assessment & Plan Note (Signed)
Sleep maintenance insomnia well controlled on trazodone 75mg  nightly.

## 2015-08-06 NOTE — Patient Instructions (Addendum)
Start buspar 5mg  daily for anxiety, if tolerated well and needed we can increase to twice daily.  Continue trazodone for sleep and other diabetes medicines.  Return in 3 months for follow up visit.

## 2015-08-06 NOTE — Assessment & Plan Note (Addendum)
Adjustment disorder vs GAD. Start buspar 5mg  QD -> BID. Discussed side effects to monitor. Update with effect. RTC 3 mo f/u visit.  Avoid controlled substances for now given MJ use. PHQ9 = 2/27, somewhat difficult to function GAD7 = 8/21

## 2015-08-06 NOTE — Progress Notes (Signed)
Pre visit review using our clinic review tool, if applicable. No additional management support is needed unless otherwise documented below in the visit note. 

## 2015-08-06 NOTE — Progress Notes (Addendum)
BP 142/80 mmHg  Pulse 64  Temp(Src) 98.2 F (36.8 C) (Oral)  Ht '5\' 7"'  (1.702 m)  Wt 174 lb 8 oz (79.153 kg)  BMI 27.32 kg/m2   CC: new pt to establish Subjective:    Patient ID: Andrew Hart, male    DOB: 10-13-1970, 45 y.o.   MRN: [id number]  HPI: Andrew Hart is a 45 y.o. male presenting on 08/06/2015 for Establish Care   Prior PCP was Dr Scarlette Ar [facility].   No h/o HTN.   H/o MJ and alcohol use. Self medicated after mother's death 07/13/09). Denies depression. Stays anxious. Feels wife and children ground him. Main concern is anxiety. Excessive worrying, stays impatient, on edge and anxious. Trouble prioritizing tasks.   Has established with Dr Dwyane Dee for his T2DM.  Lab Results  Component Value Date   HGBA1C 8.2* 07/17/2015    Insomnia - takes trazodone 52m nightly. This helps his sleep maintenance insomnia.   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 Interim medical history since our last visit reviewed. Allergies and medications reviewed and updated. Current Outpatient Prescriptions on File Prior to Visit  Medication Sig  . acyclovir (ZOVIRAX) 200 MG capsule Take 200 mg by mouth as needed.   .Marland Kitchenaspirin 81 MG tablet Take 81 mg by mouth daily.  .Marland KitchenBAYER MICROLET LANCETS lancets Use as instructed to check blood sugar twice a day dx E11.65  . Blood Glucose Monitoring Suppl (BAYER CONTOUR NEXT MONITOR) w/Device KIT Use to check blood sugar once a day  . canagliflozin (INVOKANA) 300 MG TABS tablet Take 1 tablet (300 mg total) by mouth daily before breakfast.  . glipiZIDE (GLUCOTROL XL) 5 MG 24 hr tablet Take 1 tablet (5 mg total) by mouth daily with breakfast.  . glucose blood (BAYER CONTOUR NEXT  TEST) test strip Use as instructed to check blood sugar twice a day dx code E11.65  . pioglitazone (ACTOS) 30 MG tablet Take 1 tablet (30 mg total) by mouth daily.  . RaNITidine HCl (ACID REDUCER PO) Take by mouth.  . saxagliptin HCl (ONGLYZA) 5 MG TABS tablet Take 1 tablet (5 mg total) by mouth daily.  . simvastatin (ZOCOR) 80 MG tablet Take 80 mg by mouth daily.  . traZODone (DESYREL) 150 MG tablet Take by mouth at bedtime. Takes 1/2 tablet at night   No current facility-administered medications on file prior to visit.    Review of Systems Per HPI unless specifically indicated in ROS section     Objective:    BP 142/80 mmHg  Pulse 64  Temp(Src) 98.2 F (36.8 C) (Oral)  Ht '5\' 7"'  (1.702 m)  Wt 174 lb 8 oz (79.153 kg)  BMI 27.32 kg/m2  Wt Readings from Last 3 Encounters:  08/06/15 174 lb 8 oz (79.153 kg)  07/20/15 176 lb 3.2 oz (79.924 kg)  06/07/15 180 lb 9.6 oz (81.92 kg)    Physical Exam  Constitutional: He appears well-developed and well-nourished. No distress.  Cardiovascular: Normal rate, regular rhythm, normal heart sounds and intact distal pulses.   No murmur heard. Pulmonary/Chest: Effort normal and breath sounds normal. No respiratory distress. He has no wheezes. He has no rales.  Musculoskeletal: He exhibits no edema.  Skin: Skin is warm and dry. No rash noted.  Psychiatric: He has a normal mood and affect. His behavior is normal. Judgment and thought content normal.  Nursing note and vitals reviewed.  Results for orders placed or performed in visit on 07/17/15  Hemoglobin A1c  Result Value Ref Range   Hgb A1c MFr Bld 8.2 (H) 4.6 - 6.5 %  Comprehensive metabolic panel  Result Value Ref Range   Sodium 139 135 - 145 mEq/L   Potassium 4.3 3.5 - 5.1 mEq/L   Chloride 106 96 - 112 mEq/L   CO2 23 19 - 32 mEq/L   Glucose, Bld 162 (H) 70 - 99 mg/dL   BUN 24 (H) 6 - 23 mg/dL   Creatinine, Ser 0.86 0.40 - 1.50 mg/dL   Total Bilirubin 0.7 0.2 - 1.2 mg/dL   Alkaline  Phosphatase 82 39 - 117 U/L   AST 19 0 - 37 U/L   ALT 23 0 - 53 U/L   Total Protein 6.8 6.0 - 8.3 g/dL   Albumin 4.4 3.5 - 5.2 g/dL   Calcium 9.4 8.4 - 10.5 mg/dL   GFR 102.01 >60.00 mL/min  Microalbumin / creatinine urine ratio  Result Value Ref Range   Microalb, Ur 0.8 0.0 - 1.9 mg/dL   Creatinine,U 66.5 mg/dL   Microalb Creat Ratio 1.2 0.0 - 30.0 mg/g  Lipid panel  Result Value Ref Range   Cholesterol 133 0 - 200 mg/dL   Triglycerides 99.0 0.0 - 149.0 mg/dL   HDL 33.40 (L) >39.00 mg/dL   VLDL 19.8 0.0 - 40.0 mg/dL   LDL Cholesterol 80 0 - 99 mg/dL   Total CHOL/HDL Ratio 4    NonHDL 100.07       Assessment & Plan:   Problem List Items Addressed This Visit    Uncontrolled type 2 diabetes mellitus with hyperglycemia, without long-term current use of insulin (HCC) - Primary    Appreciate endo care of patient. Continue current regimen. Has f/u with Dr Dwyane Dee scheduled.      Hyperlipidemia    Chronic, continue simvastatin.       Insomnia    Sleep maintenance insomnia well controlled on trazodone 15m nightly.      Adjustment disorder with anxiety    Adjustment disorder vs GAD. Start buspar 588mQD -> BID. Discussed side effects to monitor. Update with effect. RTC 3 mo f/u visit.  Avoid controlled substances for now given MJ use. PHQ9 = 2/27, somewhat difficult to function GAD7 = 8/21          Follow up plan: Return in about 3 months (around 11/05/2015), or as needed, for follow up visit.  JaRia BushMD

## 2015-08-06 NOTE — Assessment & Plan Note (Signed)
Appreciate endo care of patient. Continue current regimen. Has f/u with Dr Lucianne MussKumar scheduled.

## 2015-08-13 ENCOUNTER — Encounter: Payer: Self-pay | Admitting: Family Medicine

## 2015-08-13 DIAGNOSIS — K219 Gastro-esophageal reflux disease without esophagitis: Secondary | ICD-10-CM | POA: Insufficient documentation

## 2015-08-21 ENCOUNTER — Other Ambulatory Visit: Payer: Self-pay | Admitting: Endocrinology

## 2015-08-31 ENCOUNTER — Other Ambulatory Visit: Payer: Self-pay | Admitting: *Deleted

## 2015-08-31 MED ORDER — TRAZODONE HCL 150 MG PO TABS: 75.0000 mg | ORAL_TABLET | Freq: Every day | ORAL | Status: DC

## 2015-08-31 NOTE — Telephone Encounter (Signed)
Ok to refill 

## 2015-09-02 ENCOUNTER — Encounter: Payer: Self-pay | Admitting: Family Medicine

## 2015-09-02 DIAGNOSIS — N529 Male erectile dysfunction, unspecified: Secondary | ICD-10-CM | POA: Insufficient documentation

## 2015-09-18 ENCOUNTER — Other Ambulatory Visit (INDEPENDENT_AMBULATORY_CARE_PROVIDER_SITE_OTHER): Payer: BLUE CROSS/BLUE SHIELD

## 2015-09-18 DIAGNOSIS — E1165 Type 2 diabetes mellitus with hyperglycemia: Secondary | ICD-10-CM

## 2015-09-18 LAB — BASIC METABOLIC PANEL
BUN: 19 mg/dL (ref 6–23)
CHLORIDE: 108 meq/L (ref 96–112)
CO2: 26 meq/L (ref 19–32)
CREATININE: 0.96 mg/dL (ref 0.40–1.50)
Calcium: 9.2 mg/dL (ref 8.4–10.5)
GFR: 89.78 mL/min (ref >60.00)
Glucose, Bld: 152 mg/dL — ABNORMAL HIGH (ref 70–99)
POTASSIUM: 4.1 meq/L (ref 3.5–5.1)
Sodium: 142 mEq/L (ref 135–145)

## 2015-09-18 LAB — HEMOGLOBIN A1C: HEMOGLOBIN A1C: 7.7 % — AB (ref 4.6–6.5)

## 2015-09-21 ENCOUNTER — Ambulatory Visit (INDEPENDENT_AMBULATORY_CARE_PROVIDER_SITE_OTHER): Payer: BLUE CROSS/BLUE SHIELD | Admitting: Endocrinology

## 2015-09-21 ENCOUNTER — Encounter: Payer: Self-pay | Admitting: Endocrinology

## 2015-09-21 VITALS — BP 134/82 | HR 66 | Ht 67.0 in | Wt 175.0 lb

## 2015-09-21 DIAGNOSIS — E1165 Type 2 diabetes mellitus with hyperglycemia: Secondary | ICD-10-CM

## 2015-09-21 NOTE — Progress Notes (Signed)
Patient ID: Andrew Hart, male   DOB: Nov 05, 1970, 45 y.o.   MRN: [id number]           Reason for Appointment: Follow-up for Type 2 Diabetes   History of Present Illness:          Date of diagnosis of type 2 diabetes mellitus: 2007 ?        Background history:  He was diagnosed with routine screening lab work He was initially tried on metformin but this caused diarrhea and he could not continue this Subsequently was treated with glipizide and apparently this was able to control his blood sugars fairly well for a few years Probably in early 2016 he was given Actos in addition to help his glucose control as A1c had gone up to 9.4 This helped transiently and A1c was 7.7 However his A1c has been otherwise over 8% since mid 2015  Recent history:   Because of persistently high blood sugars on his initial consultation (A1c was 9.1) he was started on Invokana and Onglyza in addition to his Actos and glipizide in 1/16 Also glipizide ER was reduced down to 5 mg Invokana was increased to 300 mg on his last visit His A1c is improved but only down to 7.7, previously 8.2  Current blood sugar patterns and problems identified:   He is checking his blood sugars infrequently and mostly in the morning, has only 5 readings in the last 2 weeks  Fasting readings are mildly increased but today down to 114, late morning lab glucose was 154  Blood sugars after meals have been checked very sporadically, has one reading of 176 after lunch  Also he has a couple of readings at bedtime last month which were not significantly high; previously was having readings as high as 309  No hypoglycemia with glipizide  He thinks he is very active at work   Despite restarting Actos his weight is stable, no edema  He has some more frequency of urination with Invokana but not at night  Non-insulin hypoglycemic drugs the patient is taking are: Glipizide ER 5 mg daily, Actos 30 mg daily, Invokana 300 mg daily        Side effects from medications have been: Diarrhea from metformin  Compliance with the medical regimen: Fair Hypoglycemia: None   Glucose monitoring:  done 0-1  times a day         Glucometer:  Contour    Blood Glucose readings  by download as above   Self-care: The diet that the patient has been following is: tries to limit sweets and drinks which sugar .     Typical meal intake: Breakfast is a breakfast bar on weekdays, eggs, bacon and grits on weekends Lunch = sandwich, soup Dinner =  chicken and pasta.  Snacks will be potato chips, nuts and cheese               Dietician visit, most recent: 06/2015               Exercise:  somewhat active when he is working  Weight history: usual Previous weight 170   Wt Readings from Last 3 Encounters:  09/21/15 175 lb (79.379 kg)  08/06/15 174 lb 8 oz (79.153 kg)  07/20/15 176 lb 3.2 oz (79.924 kg)    Glycemic control:    Lab Results  Component Value Date   HGBA1C 7.7* 09/18/2015   HGBA1C 8.2* 07/17/2015   Lab Results  Component Value Date   MICROALBUR  0.8 07/17/2015   LDLCALC 80 07/17/2015   CREATININE 0.96 09/18/2015    Lab on 09/18/2015  Component Date Value Ref Range Status  . Hgb A1c MFr Bld 09/18/2015 7.7* 4.6 - 6.5 % Final   Glycemic Control Guidelines for People with Diabetes:Non Diabetic:  <6%Goal of Therapy: <7%Additional Action Suggested:  >8%   . Sodium 09/18/2015 142  135 - 145 mEq/L Final  . Potassium 09/18/2015 4.1  3.5 - 5.1 mEq/L Final  . Chloride 09/18/2015 108  96 - 112 mEq/L Final  . CO2 09/18/2015 26  19 - 32 mEq/L Final  . Glucose, Bld 09/18/2015 152* 70 - 99 mg/dL Final  . BUN 09/18/2015 19  6 - 23 mg/dL Final  . Creatinine, Ser 09/18/2015 0.96  0.40 - 1.50 mg/dL Final  . Calcium 09/18/2015 9.2  8.4 - 10.5 mg/dL Final  . GFR 09/18/2015 89.78  >60.00 mL/min Final        Medication List       This list is accurate as of: 09/21/15 12:42 PM.  Always use your most recent med list.                ACID REDUCER PO  Take by mouth.     acyclovir 200 MG capsule  Commonly known as:  ZOVIRAX  Take 200 mg by mouth as needed.     aspirin 81 MG tablet  Take 81 mg by mouth daily.     BAYER CONTOUR NEXT MONITOR w/Device Kit  Use to check blood sugar once a day     BAYER MICROLET LANCETS lancets  Use as instructed to check blood sugar twice a day dx E11.65     busPIRone 5 MG tablet  Commonly known as:  BUSPAR  Take 1 tablet (5 mg total) by mouth 2 (two) times daily.     canagliflozin 300 MG Tabs tablet  Commonly known as:  INVOKANA  Take 1 tablet (300 mg total) by mouth daily before breakfast.     glipiZIDE 5 MG 24 hr tablet  Commonly known as:  GLUCOTROL XL  Take 1 tablet (5 mg total) by mouth daily with breakfast.     glucose blood test strip  Commonly known as:  BAYER CONTOUR NEXT TEST  Use as instructed to check blood sugar twice a day dx code E11.65     ONGLYZA 5 MG Tabs tablet  Generic drug:  saxagliptin HCl  TAKE 1 TABLET BY MOUTH DAILY.     pioglitazone 30 MG tablet  Commonly known as:  ACTOS  Take 1 tablet (30 mg total) by mouth daily.     simvastatin 80 MG tablet  Commonly known as:  ZOCOR  Take 80 mg by mouth daily.     traZODone 150 MG tablet  Commonly known as:  DESYREL  Take 0.5 tablets (75 mg total) by mouth at bedtime.        Allergies: No Known Allergies  Past Medical History  Diagnosis Date  . Diabetes mellitus without complication ([place]) 7741    Andrew Hart  . Hyperlipidemia   . Herpes labialis   . History of chicken pox   . GERD (gastroesophageal reflux disease)     Past Surgical History  Procedure Laterality Date  . Appendectomy    . Finger surgery Right     2 screws in ring finger  . Tonsillectomy      Family History  Problem Relation Age of Onset  . Diabetes Mother   . CAD Father  53    MI at 30, CABG at 79  . Diabetes Maternal Grandmother   . Parkinsonism Mother 3    progressive supranuclear palsy, deceased  . Stroke Neg Hx    . Cancer Neg Hx     Social History:  reports that he quit smoking about 9 months ago. His smoking use included Cigarettes. He has a 7.5 pack-year smoking history. He has never used smokeless tobacco. He reports that he drinks alcohol. He reports that he uses illicit drugs.    Review of Systems    Lipid history:  Has been on simvastatin for treatment for some time Recent lipids look good except for low HDL    Lab Results  Component Value Date   CHOL 133 07/17/2015   HDL 33.40* 07/17/2015   LDLCALC 80 07/17/2015   TRIG 99.0 07/17/2015   CHOLHDL 4 07/17/2015            Most recent eye exam was in 04/2015  Most recent foot exam: 1/17  Review of Systems    Physical Examination:  BP 134/82 mmHg  Pulse 66  Ht '5\' 7"'  (1.702 m)  Wt 175 lb (79.379 kg)  BMI 27.40 kg/m2  SpO2 97%  ASSESSMENT:  Diabetes type 2, BMI 28   See history of present illness for detailed discussion of current diabetes management, blood sugar patterns and problems identified  He is currently on a multidrug regimen of Invokana, Actos, Onglyza and glipizide low-dose  A1c is 7.7 but he has been on a higher dose of Invokana and starting Actos only about 8 weeks ago He is not checking his blood sugars adequately and difficult to know if he has post prandial hyperglycemia Most of his readings at home have been fairly good without any postprandial readings over 180 Weight is stable  PLAN:    No change in medications  Consistent diet  Discussed blood sugar targets at various times, need to check readings after meals more consistently done in the morning   Patient Instructions  Check blood sugars on waking up 2-3  times a week Also check blood sugars about 2 hours after a meal and do this after different meals by rotation  Recommended blood sugar levels on waking up is 90-130 and about 2 hours after meal is 130-160  Please bring your blood sugar monitor to each visit, thank you Walk  daily  Low fat meals      Andrew Hart 09/21/2015, 12:42 PM   Note: This office note was prepared with Estate agent. Any transcriptional errors that result from this process are unintentional.

## 2015-09-21 NOTE — Patient Instructions (Signed)
Check blood sugars on waking up 2-3  times a week Also check blood sugars about 2 hours after a meal and do this after different meals by rotation  Recommended blood sugar levels on waking up is 90-130 and about 2 hours after meal is 130-160  Please bring your blood sugar monitor to each visit, thank you Walk daily  Low fat meals

## 2015-10-02 ENCOUNTER — Other Ambulatory Visit: Payer: Self-pay | Admitting: Endocrinology

## 2015-11-08 ENCOUNTER — Ambulatory Visit (INDEPENDENT_AMBULATORY_CARE_PROVIDER_SITE_OTHER): Payer: BLUE CROSS/BLUE SHIELD | Admitting: Family Medicine

## 2015-11-08 ENCOUNTER — Encounter: Payer: Self-pay | Admitting: Family Medicine

## 2015-11-08 VITALS — BP 110/70 | HR 68 | Temp 98.1°F | Wt 171.5 lb

## 2015-11-08 DIAGNOSIS — Z789 Other specified health status: Secondary | ICD-10-CM | POA: Diagnosis not present

## 2015-11-08 DIAGNOSIS — G4700 Insomnia, unspecified: Secondary | ICD-10-CM

## 2015-11-08 DIAGNOSIS — Z7289 Other problems related to lifestyle: Secondary | ICD-10-CM

## 2015-11-08 DIAGNOSIS — Z87898 Personal history of other specified conditions: Secondary | ICD-10-CM | POA: Insufficient documentation

## 2015-11-08 DIAGNOSIS — F4322 Adjustment disorder with anxiety: Secondary | ICD-10-CM

## 2015-11-08 DIAGNOSIS — F1090 Alcohol use, unspecified, uncomplicated: Secondary | ICD-10-CM

## 2015-11-08 HISTORY — DX: Other specified health status: Z78.9

## 2015-11-08 HISTORY — DX: Other problems related to lifestyle: Z72.89

## 2015-11-08 HISTORY — DX: Alcohol use, unspecified, uncomplicated: F10.90

## 2015-11-08 MED ORDER — BUSPIRONE HCL 10 MG PO TABS: 10.0000 mg | ORAL_TABLET | Freq: Two times a day (BID) | ORAL | Status: DC

## 2015-11-08 NOTE — Patient Instructions (Addendum)
I'm glad you're doing well. Continue buspar, increase dose to 10mg  twice daily. Update me with effect after 1-2 months. Return in 4-5 months for physical, no labs prior.

## 2015-11-08 NOTE — Assessment & Plan Note (Signed)
Doing better on buspar 5mg  bid, will increase dose to 10mg  bid. Discussed monitoring for dizziness. Update with effect in 1-2 months. Pt agrees with plan.

## 2015-11-08 NOTE — Progress Notes (Signed)
Pre visit review using our clinic review tool, if applicable. No additional management support is needed unless otherwise documented below in the visit note. 

## 2015-11-08 NOTE — Progress Notes (Signed)
BP 110/70 mmHg  Pulse 68  Temp(Src) 98.1 F (36.7 C) (Oral)  Wt 171 lb 8 oz (77.792 kg)   CC: 25mof/u visit  Subjective:    Patient ID: [name] male    DOB: 104-07-1970 45y.o.   MRN: [id number] HPI: Andrew DOSSis a 45y.o. male presenting on 11/08/2015 for Follow-up   Established care 3 mo ago. Last visit diagnosed with adjustment disorder with anxiety - buspar 581mbid started. Trazodone continued prn insomnia. Feels this is helpful and would like to increase dose.   Lab Results  Component Value Date   HGBA1C 7.7* 09/18/2015    Alcohol - decreasing some. 5 beers per sitting x10 over last 1-2 months. No hangover.   Relevant past medical, surgical, family and social history reviewed and updated as indicated. Interim medical history since our last visit reviewed. Allergies and medications reviewed and updated. Current Outpatient Prescriptions on File Prior to Visit  Medication Sig  . acyclovir (ZOVIRAX) 200 MG capsule Take 200 mg by mouth as needed.   . Marland Kitchenspirin 81 MG tablet Take 81 mg by mouth daily.  . Marland KitchenAYER MICROLET LANCETS lancets Use as instructed to check blood sugar twice a day dx E11.65  . Blood Glucose Monitoring Suppl (BAYER CONTOUR NEXT MONITOR) w/Device KIT Use to check blood sugar once a day  . canagliflozin (INVOKANA) 300 MG TABS tablet Take 1 tablet (300 mg total) by mouth daily before breakfast.  . GLIPIZIDE XL 5 MG 24 hr tablet TAKE 1 TABLET BY MOUTH DAILY WITH BREAKFAST.  . Marland Kitchenlucose blood (BAYER CONTOUR NEXT TEST) test strip Use as instructed to check blood sugar twice a day dx code E11.65  . ONGLYZA 5 MG TABS tablet TAKE 1 TABLET BY MOUTH DAILY.  . Marland Kitchenioglitazone (ACTOS) 30 MG tablet Take 1 tablet (30 mg total) by mouth daily.  . RaNITidine HCl (ACID REDUCER PO) Take by mouth.  . simvastatin (ZOCOR) 80 MG tablet Take 80 mg by mouth daily.  . traZODone (DESYREL) 150 MG tablet Take 0.5 tablets (75 mg total) by mouth at bedtime.   No current  facility-administered medications on file prior to visit.    Review of Systems Per HPI unless specifically indicated in ROS section     Objective:    BP 110/70 mmHg  Pulse 68  Temp(Src) 98.1 F (36.7 C) (Oral)  Wt 171 lb 8 oz (77.792 kg)  Wt Readings from Last 3 Encounters:  11/08/15 171 lb 8 oz (77.792 kg)  09/21/15 175 lb (79.379 kg)  08/06/15 174 lb 8 oz (79.153 kg)    Physical Exam  Constitutional: He appears well-developed and well-nourished. No distress.  Eyes: Conjunctivae and EOM are normal. Pupils are equal, round, and reactive to light.  Neck: No thyromegaly present.  Cardiovascular: Normal rate, regular rhythm, normal heart sounds and intact distal pulses.   No murmur heard. Pulmonary/Chest: Effort normal and breath sounds normal. No respiratory distress. He has no wheezes. He has no rales.  Musculoskeletal: He exhibits no edema.  Psychiatric: He has a normal mood and affect. His behavior is normal. Thought content normal.  Nursing note and vitals reviewed.  Results for orders placed or performed in visit on 09/18/15  Hemoglobin A1c  Result Value Ref Range   Hgb A1c MFr Bld 7.7 (H) 4.6 - 6.5 %  Basic metabolic panel  Result Value Ref Range   Sodium 142 135 - 145 mEq/L   Potassium 4.1 3.5 - 5.1  mEq/L   Chloride 108 96 - 112 mEq/L   CO2 26 19 - 32 mEq/L   Glucose, Bld 152 (H) 70 - 99 mg/dL   BUN 19 6 - 23 mg/dL   Creatinine, Ser 0.96 0.40 - 1.50 mg/dL   Calcium 9.2 8.4 - 10.5 mg/dL   GFR 89.78 >60.00 mL/min      Assessment & Plan:  RTC 4-5 mo for CPE Problem List Items Addressed This Visit    Insomnia    Sleep maintenance insomnia. Continue trazodone 45m nightly.       Adjustment disorder with anxiety - Primary    Doing better on buspar 554mbid, will increase dose to 105mid. Discussed monitoring for dizziness. Update with effect in 1-2 months. Pt agrees with plan.      Alcohol use ([facility]  Reviewed alcohol use with patient. Consider audit  questionairre next visit.           Follow up plan: Return in about 4 months (around 03/10/2016), or as needed, for annual exam, prior fasting for blood work.  Andrew Hart

## 2015-11-08 NOTE — Assessment & Plan Note (Signed)
Sleep maintenance insomnia. Continue trazodone 75mg  nightly.

## 2015-11-08 NOTE — Assessment & Plan Note (Addendum)
Reviewed alcohol use with patient. Consider audit questionairre next visit.

## 2015-11-28 ENCOUNTER — Other Ambulatory Visit: Payer: Self-pay | Admitting: Endocrinology

## 2015-12-05 ENCOUNTER — Other Ambulatory Visit: Payer: Self-pay | Admitting: Endocrinology

## 2015-12-24 ENCOUNTER — Other Ambulatory Visit: Payer: BLUE CROSS/BLUE SHIELD

## 2015-12-25 ENCOUNTER — Other Ambulatory Visit (INDEPENDENT_AMBULATORY_CARE_PROVIDER_SITE_OTHER): Payer: BLUE CROSS/BLUE SHIELD

## 2015-12-25 DIAGNOSIS — E1165 Type 2 diabetes mellitus with hyperglycemia: Secondary | ICD-10-CM

## 2015-12-25 LAB — BASIC METABOLIC PANEL
BUN: 18 mg/dL (ref 6–23)
CHLORIDE: 106 meq/L (ref 96–112)
CO2: 28 mEq/L (ref 19–32)
Calcium: 8.8 mg/dL (ref 8.4–10.5)
Creatinine, Ser: 1.06 mg/dL (ref 0.40–1.50)
GFR: 79.98 mL/min (ref >60.00)
GLUCOSE: 196 mg/dL — AB (ref 70–99)
POTASSIUM: 4.2 meq/L (ref 3.5–5.1)
SODIUM: 141 meq/L (ref 135–145)

## 2015-12-25 LAB — HEMOGLOBIN A1C: Hgb A1c MFr Bld: 7.5 % — ABNORMAL HIGH (ref 4.6–6.5)

## 2015-12-27 ENCOUNTER — Encounter: Payer: Self-pay | Admitting: Endocrinology

## 2015-12-27 ENCOUNTER — Ambulatory Visit (INDEPENDENT_AMBULATORY_CARE_PROVIDER_SITE_OTHER): Payer: BLUE CROSS/BLUE SHIELD | Admitting: Endocrinology

## 2015-12-27 VITALS — BP 110/80 | HR 60 | Ht 67.0 in | Wt 173.0 lb

## 2015-12-27 DIAGNOSIS — E1165 Type 2 diabetes mellitus with hyperglycemia: Secondary | ICD-10-CM

## 2015-12-27 NOTE — Patient Instructions (Signed)
Check blood sugars on waking up    Also check blood sugars about 2 hours after a meal and do this after different meals by rotation  Recommended blood sugar levels on waking up is 90-130 and about 2 hours after meal is 130-160  Please bring your blood sugar monitor to each visit, thank you  No sugar in drinks

## 2015-12-27 NOTE — Progress Notes (Signed)
Patient ID: Andrew Hart, male   DOB: [birth year], 45 y.o.   MRN: [id number]           Reason for Appointment: Follow-up for Type 2 Diabetes   History of Present Illness:          Date of diagnosis of type 2 diabetes mellitus: 2007 ?        Background history:  He was diagnosed with routine screening lab work He was initially tried on metformin but this caused diarrhea and he could not continue this Subsequently was treated with glipizide and apparently this was able to control his blood sugars fairly well for a few years Probably in early 2016 he was given Actos in addition to help his glucose control as A1c had gone up to 9.4 This helped transiently and A1c was 7.7 However his A1c has been otherwise over 8% since mid 2015  Recent history:  Non-insulin hypoglycemic drugs the patient is taking are: Glipizide ER 5 mg daily, Actos 30 mg daily, Invokana 300 mg daily, Onglyza 5 mg daily  Because of persistently high blood sugars on his initial consultation in 1/16 (A1c was 9.1) he was started on Invokana and Onglyza in addition to his Actos and glipizide  His A1c is improved but still over 7%, now 7.5  Current blood sugar patterns and problems identified:   He is checking his blood sugars infrequently as he does not like to break his finger and has only one reading from yesterday afternoon which was 175 postprandial  However lab glucose was 196 fasting  He thinks he can do better and his diet  especially with cutting back on regular soft drinks or Gatorade which she has more regularly now       Side effects from medications have been: Diarrhea from metformin  Compliance with the medical regimen: Fair Hypoglycemia: None   Glucose monitoring:  done 0-1  times a day         Glucometer:  Contour    Blood Glucose readings  by download as above  Self-care: The diet that the patient has been following is: tries to limit sweets      Typical meal intake: Breakfast is a breakfast bar  on weekdays, eggs, bacon and grits on weekends Lunch = sandwich, soup Dinner =  chicken and pasta.  Snacks will be potato chips, nuts and cheese Now having more regular soft drinks                Dietician visit, most recent: 06/2015               Exercise:   active when he is working  Weight history: usual Previous weight 170   Wt Readings from Last 3 Encounters:  12/27/15 173 lb (78.5 kg)  11/08/15 171 lb 8 oz (77.8 kg)  09/21/15 175 lb (79.4 kg)    Glycemic control:    Lab Results  Component Value Date   HGBA1C 7.5 (H) 12/25/2015   HGBA1C 7.7 (H) 09/18/2015   HGBA1C 8.2 (H) 07/17/2015   Lab Results  Component Value Date   MICROALBUR 0.8 07/17/2015   LDLCALC 80 07/17/2015   CREATININE 1.06 12/25/2015    Lab on 12/25/2015  Component Date Value Ref Range Status  . Hgb A1c MFr Bld 12/25/2015 7.5* 4.6 - 6.5 % Final  . Sodium 12/25/2015 141  135 - 145 mEq/L Final  . Potassium 12/25/2015 4.2  3.5 - 5.1 mEq/L Final  . Chloride 12/25/2015 106  96 - 112 mEq/L Final  . CO2 12/25/2015 28  19 - 32 mEq/L Final  . Glucose, Bld 12/25/2015 196* 70 - 99 mg/dL Final  . BUN 12/25/2015 18  6 - 23 mg/dL Final  . Creatinine, Ser 12/25/2015 1.06  0.40 - 1.50 mg/dL Final  . Calcium 12/25/2015 8.8  8.4 - 10.5 mg/dL Final  . GFR 12/25/2015 79.98  >60.00 mL/min Final        Medication List       Accurate as of 12/27/15  9:55 AM. Always use your most recent med list.          ACID REDUCER PO Take by mouth.   acyclovir 200 MG capsule Commonly known as:  ZOVIRAX Take 200 mg by mouth as needed.   aspirin 81 MG tablet Take 81 mg by mouth daily.   BAYER CONTOUR NEXT MONITOR w/Device Kit Use to check blood sugar once a day   BAYER MICROLET LANCETS lancets Use as instructed to check blood sugar twice a day dx E11.65   busPIRone 10 MG tablet Commonly known as:  BUSPAR Take 1 tablet (10 mg total) by mouth 2 (two) times daily.   GLIPIZIDE XL 5 MG 24 hr tablet Generic drug:   glipiZIDE TAKE 1 TABLET BY MOUTH DAILY WITH BREAKFAST.   glucose blood test strip Commonly known as:  BAYER CONTOUR NEXT TEST Use as instructed to check blood sugar twice a day dx code E11.65   INVOKANA 300 MG Tabs tablet Generic drug:  canagliflozin TAKE 1 TABLET (300 MG TOTAL) BY MOUTH DAILY BEFORE BREAKFAST.   ONGLYZA 5 MG Tabs tablet Generic drug:  saxagliptin HCl TAKE 1 TABLET BY MOUTH DAILY.   pioglitazone 30 MG tablet Commonly known as:  ACTOS TAKE 1 TABLET (30 MG TOTAL) BY MOUTH DAILY.   simvastatin 80 MG tablet Commonly known as:  ZOCOR Take 80 mg by mouth daily.   traZODone 150 MG tablet Commonly known as:  DESYREL Take 0.5 tablets (75 mg total) by mouth at bedtime.       Allergies: No Known Allergies  Past Medical History:  Diagnosis Date  . Alcohol use ([place]) 11/08/2015  . Diabetes mellitus without complication ([place]) 6812   Naphtali Zywicki  . GERD (gastroesophageal reflux disease)   . Herpes labialis   . History of chicken pox   . Hyperlipidemia     Past Surgical History:  Procedure Laterality Date  . APPENDECTOMY    . FINGER SURGERY Right    2 screws in ring finger  . TONSILLECTOMY      Family History  Problem Relation Age of Onset  . Diabetes Mother   . CAD Father 87    MI at 16, CABG at 110  . Diabetes Maternal Grandmother   . Parkinsonism Mother 62    progressive supranuclear palsy, deceased  . Stroke Neg Hx   . Cancer Neg Hx     Social History:  reports that he quit smoking about 12 months ago. His smoking use included Cigarettes. He has a 7.50 pack-year smoking history. He has never used smokeless tobacco. He reports that he drinks alcohol. He reports that he uses drugs.    Review of Systems    Lipid history:  Has been on simvastatin for treatment for some time His lipids look good except for low HDL    Lab Results  Component Value Date   CHOL 133 07/17/2015   HDL 33.40 (L) 07/17/2015   LDLCALC 80 07/17/2015   TRIG  99.0 07/17/2015    CHOLHDL 4 07/17/2015           Most recent eye exam was in 04/2015  Most recent foot exam: 1/17  Review of Systems    Physical Examination:  BP 110/80   Pulse 60   Ht '5\' 7"'  (1.702 m)   Wt 173 lb (78.5 kg)   BMI 27.10 kg/m   ASSESSMENT:  Diabetes type 2, BMI 28   See history of present illness for detailed discussion of current diabetes management, blood sugar patterns and problems identified  He is currently on a multidrug regimen of Invokana300 mg, Actos, Onglyza and glipizide 5 mg  A1c is 7.5 and considering his young age needs to be below 7% He is refusing to consider injectable drugs Also very reluctant to monitor blood sugars more often as he does not like to break his finger He can do better with his diet especially drinking less soft drinks that are not diet   PLAN:     Discussed blood sugar targets at various times, need to check readings at least once a day at various times  Cut back on regular soft drinks  Follow instructions from dietitian  If  A1c is still high on the next visit may consider Victoza   Patient Instructions  Check blood sugars on waking up    Also check blood sugars about 2 hours after a meal and do this after different meals by rotation  Recommended blood sugar levels on waking up is 90-130 and about 2 hours after meal is 130-160  Please bring your blood sugar monitor to each visit, thank you  No sugar in drinks      Karmello Abercrombie 12/27/2015, 9:55 AM   Note: This office note was prepared with Dragon voice recognition system technology. Any transcriptional errors that result from this process are unintentional.

## 2016-01-04 ENCOUNTER — Other Ambulatory Visit: Payer: Self-pay | Admitting: Endocrinology

## 2016-03-07 ENCOUNTER — Other Ambulatory Visit: Payer: Self-pay | Admitting: Endocrinology

## 2016-03-25 ENCOUNTER — Other Ambulatory Visit (INDEPENDENT_AMBULATORY_CARE_PROVIDER_SITE_OTHER): Payer: BLUE CROSS/BLUE SHIELD

## 2016-03-25 DIAGNOSIS — E1165 Type 2 diabetes mellitus with hyperglycemia: Secondary | ICD-10-CM

## 2016-03-25 LAB — BASIC METABOLIC PANEL
BUN: 16 mg/dL (ref 6–23)
CALCIUM: 9.7 mg/dL (ref 8.4–10.5)
CO2: 29 mEq/L (ref 19–32)
Chloride: 103 mEq/L (ref 96–112)
Creatinine, Ser: 1 mg/dL (ref 0.40–1.50)
GFR: 85.45 mL/min (ref >60.00)
Glucose, Bld: 159 mg/dL — ABNORMAL HIGH (ref 70–99)
POTASSIUM: 4.4 meq/L (ref 3.5–5.1)
SODIUM: 139 meq/L (ref 135–145)

## 2016-03-25 LAB — HEMOGLOBIN A1C: Hgb A1c MFr Bld: 7.4 % — ABNORMAL HIGH (ref 4.6–6.5)

## 2016-04-01 ENCOUNTER — Ambulatory Visit (INDEPENDENT_AMBULATORY_CARE_PROVIDER_SITE_OTHER): Payer: BLUE CROSS/BLUE SHIELD | Admitting: Endocrinology

## 2016-04-01 ENCOUNTER — Encounter: Payer: Self-pay | Admitting: Endocrinology

## 2016-04-01 VITALS — BP 126/76 | HR 65 | Ht 67.0 in | Wt 168.0 lb

## 2016-04-01 DIAGNOSIS — E1165 Type 2 diabetes mellitus with hyperglycemia: Secondary | ICD-10-CM

## 2016-04-01 DIAGNOSIS — Z23 Encounter for immunization: Secondary | ICD-10-CM

## 2016-04-01 LAB — MICROALBUMIN / CREATININE URINE RATIO
CREATININE, U: 122.5 mg/dL
MICROALB/CREAT RATIO: 0.6 mg/g (ref 0.0–30.0)
Microalb, Ur: <0.7 mg/dL (ref 0.0–1.9)

## 2016-04-01 NOTE — Progress Notes (Signed)
Patient ID: Andrew Hart, male   DOB: 02-Jan-1971, 45 y.o.   MRN: [id number]           Reason for Appointment: Follow-up for Type 2 Diabetes   History of Present Illness:          Date of diagnosis of type 2 diabetes mellitus: 2007 ?        Background history:  He was diagnosed with routine screening lab work He was initially tried on metformin but this caused diarrhea and he could not continue this Subsequently was treated with glipizide and apparently this was able to control his blood sugars fairly well for a few years Probably in early 2016 he was given Actos in addition to help his glucose control as A1c had gone up to 9.4 This helped transiently and A1c was 7.7 However his A1c has been otherwise over 8% since mid 2015  Recent history:  Non-insulin hypoglycemic drugs the patient is taking are: Glipizide ER 5 mg daily, Actos 30 mg daily, Invokana 300 mg daily, Onglyza 5 mg daily  Because of persistently high blood sugars on his initial consultation in 1/16 (A1c was 9.1) he was started on Invokana and Onglyza in addition to his Actos and glipizide  His A1c is improved but still over 7%, now 7.4  Current blood sugar patterns and problems identified:   He is checking his blood sugars infrequently again  No fasting readings available, late morning reading was 104  Bedtime readings: 200, 157-108  He only rarely will feel hypoglycemic when he is more active and not able to eat on time; also occasionally will just drink a regular soft drinks if he is not able to eat her lunch meal  Overall he thinks he is doing better with his avoiding regular soft drinks and has lost weight  Probably has been more active also since his last visit        Side effects from medications have been: Diarrhea from metformin  Compliance with the medical regimen: Fair Hypoglycemia: None   Glucose monitoring:  done 0-1  times a day         Glucometer:  Contour    Blood Glucose readings  by  download as above  Self-care: The diet that the patient has been following is: tries to limit sweets      Typical meal intake: Breakfast is a breakfast bar on weekdays, eggs, bacon and grits on weekends Lunch = sandwich, soup Dinner =  chicken and pasta.  Snacks will be nuts and cheese Now having less regular soft drinks                Dietician visit, most recent: 06/2015               Exercise:   active when he is working  Weight history: usual Previous weight 170   Wt Readings from Last 3 Encounters:  04/01/16 168 lb (76.2 kg)  12/27/15 173 lb (78.5 kg)  11/08/15 171 lb 8 oz (77.8 kg)    Glycemic control:    Lab Results  Component Value Date   HGBA1C 7.4 (H) 03/25/2016   HGBA1C 7.5 (H) 12/25/2015   HGBA1C 7.7 (H) 09/18/2015   Lab Results  Component Value Date   MICROALBUR 0.8 07/17/2015   LDLCALC 80 07/17/2015   CREATININE 1.00 03/25/2016    No visits with results within 1 Week(s) from this visit.  Latest known visit with results is:  Lab on 03/25/2016  Component  Date Value Ref Range Status  . Hgb A1c MFr Bld 03/25/2016 7.4* 4.6 - 6.5 % Final  . Sodium 03/25/2016 139  135 - 145 mEq/L Final  . Potassium 03/25/2016 4.4  3.5 - 5.1 mEq/L Final  . Chloride 03/25/2016 103  96 - 112 mEq/L Final  . CO2 03/25/2016 29  19 - 32 mEq/L Final  . Glucose, Bld 03/25/2016 159* 70 - 99 mg/dL Final  . BUN 03/25/2016 16  6 - 23 mg/dL Final  . Creatinine, Ser 03/25/2016 1.00  0.40 - 1.50 mg/dL Final  . Calcium 03/25/2016 9.7  8.4 - 10.5 mg/dL Final  . GFR 03/25/2016 85.45  >60.00 mL/min Final        Medication List       Accurate as of 04/01/16 10:50 AM. Always use your most recent med list.          ACID REDUCER PO Take by mouth.   acyclovir 200 MG capsule Commonly known as:  ZOVIRAX Take 200 mg by mouth as needed.   aspirin 81 MG tablet Take 81 mg by mouth daily.   BAYER CONTOUR NEXT MONITOR w/Device Kit Use to check blood sugar once a day   BAYER MICROLET  LANCETS lancets Use as instructed to check blood sugar twice a day dx E11.65   busPIRone 10 MG tablet Commonly known as:  BUSPAR Take 1 tablet (10 mg total) by mouth 2 (two) times daily.   GLIPIZIDE XL 5 MG 24 hr tablet Generic drug:  glipiZIDE TAKE 1 TABLET BY MOUTH DAILY WITH BREAKFAST.   glucose blood test strip Commonly known as:  BAYER CONTOUR NEXT TEST Use as instructed to check blood sugar twice a day dx code E11.65   INVOKANA 300 MG Tabs tablet Generic drug:  canagliflozin TAKE 1 TABLET (300 MG TOTAL) BY MOUTH DAILY BEFORE BREAKFAST.   ONGLYZA 5 MG Tabs tablet Generic drug:  saxagliptin HCl TAKE 1 TABLET BY MOUTH DAILY.   pioglitazone 30 MG tablet Commonly known as:  ACTOS TAKE 1 TABLET BY MOUTH DAILY.   simvastatin 80 MG tablet Commonly known as:  ZOCOR Take 80 mg by mouth daily.   traZODone 150 MG tablet Commonly known as:  DESYREL Take 0.5 tablets (75 mg total) by mouth at bedtime.       Allergies: No Known Allergies  Past Medical History:  Diagnosis Date  . Alcohol use 11/08/2015  . Diabetes mellitus without complication ([place]) 7062   Rayola Everhart  . GERD (gastroesophageal reflux disease)   . Herpes labialis   . History of chicken pox   . Hyperlipidemia     Past Surgical History:  Procedure Laterality Date  . APPENDECTOMY    . FINGER SURGERY Right    2 screws in ring finger  . TONSILLECTOMY      Family History  Problem Relation Age of Onset  . Diabetes Mother   . CAD Father 70    MI at 3, CABG at 81  . Diabetes Maternal Grandmother   . Parkinsonism Mother 74    progressive supranuclear palsy, deceased  . Stroke Neg Hx   . Cancer Neg Hx     Social History:  reports that he quit smoking about 15 months ago. His smoking use included Cigarettes. He has a 7.50 pack-year smoking history. He has never used smokeless tobacco. He reports that he drinks alcohol. He reports that he uses drugs.    Review of Systems    Lipid history:  Has been on  simvastatin for treatment for some time His lipids look good except for low HDL    Lab Results  Component Value Date   CHOL 133 07/17/2015   HDL 33.40 (L) 07/17/2015   LDLCALC 80 07/17/2015   TRIG 99.0 07/17/2015   CHOLHDL 4 07/17/2015           Most recent eye exam was in 04/2015  Most recent foot exam: 1/17  Review of Systems    Physical Examination:  BP 126/76   Pulse 65   Ht '5\' 7"'  (1.702 m)   Wt 168 lb (76.2 kg)   BMI 26.31 kg/m   ASSESSMENT:  Diabetes type 2, BMI 28   See history of present illness for detailed discussion of current diabetes management, blood sugar patterns and problems identified  He is currently on a multidrug regimen of Invokana 300 mg, Actos 30 mg, Onglyza and glipizide 5 mg  A1c is 7.4 He is reluctant to check his blood sugars, has some needle phobia and difficult to know what his blood sugar patterns are Fasting glucose has not been checked at home but was 159 in the lab and discussed that we need to make sure this is not consistently high Also he is concerned about cost of medications.  Because of high deductible  Has lost weight and is more active since last visit Has only one high reading after evening meal   PLAN:     Discussed blood sugar targets at various times, need to check readings at least once every other day at various times.  No change in medication regimen, will tend to have hypoglycemia if he increases glipizide  Again he is reluctant to consider injectable medications  Follow instructions previously given by  Dietitian  Follow-up urine microalbumin today  Given co-pay card for Januvia in case this is less expensive than Onglyza next year   Patient Instructions  Check sugars at least every 2 days  Check blood sugars on waking up  2x per wek  Also check blood sugars about 2 hours after a meal and do this after different meals by rotation  Recommended blood sugar levels on waking up is 90-130 and about 2  hours after meal is 130-160  Please bring your blood sugar monitor to each visit, thank you       [facility] 04/01/2016, 10:50 AM   Note: This office note was prepared with Dragon voice recognition system technology. Any transcriptional errors that result from this process are unintentional.

## 2016-04-01 NOTE — Patient Instructions (Addendum)
Check sugars at least every 2 days  Check blood sugars on waking up  2x per wek  Also check blood sugars about 2 hours after a meal and do this after different meals by rotation  Recommended blood sugar levels on waking up is 90-130 and about 2 hours after meal is 130-160  Please bring your blood sugar monitor to each visit, thank you

## 2016-05-14 ENCOUNTER — Other Ambulatory Visit: Payer: Self-pay | Admitting: *Deleted

## 2016-05-14 ENCOUNTER — Other Ambulatory Visit: Payer: Self-pay | Admitting: Endocrinology

## 2016-05-14 MED ORDER — SIMVASTATIN 80 MG PO TABS: 80.0000 mg | ORAL_TABLET | Freq: Every day | ORAL | 3 refills | Status: DC

## 2016-05-19 ENCOUNTER — Telehealth: Payer: Self-pay | Admitting: Endocrinology

## 2016-05-19 NOTE — Telephone Encounter (Signed)
Pharmacy is on wating on PA INVOKANA 300 MG TABS tablet ONGLYZA 5 MG TABS tablet  [address] [place] [address] [phone] (Phone) [phone] (Fax)

## 2016-05-26 NOTE — Telephone Encounter (Signed)
PA is needed for the invokana and the onglyza, please call with the status

## 2016-05-27 ENCOUNTER — Other Ambulatory Visit: Payer: Self-pay

## 2016-05-27 MED ORDER — SITAGLIPTIN PHOSPHATE 100 MG PO TABS: 100.0000 mg | ORAL_TABLET | Freq: Every day | ORAL | 3 refills | Status: DC

## 2016-05-27 NOTE — Telephone Encounter (Signed)
Spoke with patient and let him know that PA for Onglyza was denied and the one for Invokana is still under review- will call him back when I have more information for him

## 2016-05-28 ENCOUNTER — Telehealth: Payer: Self-pay | Admitting: Endocrinology

## 2016-05-28 NOTE — Telephone Encounter (Signed)
Pt called in and said that the Januvia is also requiring a PA.  Also[facility] and [facility] call back regarding the same issue.

## 2016-05-29 ENCOUNTER — Other Ambulatory Visit: Payer: Self-pay

## 2016-05-29 MED ORDER — SITAGLIPTIN PHOS-METFORMIN HCL 50-1000 MG PO TABS: 1.0000 | ORAL_TABLET | Freq: Two times a day (BID) | ORAL | 2 refills | Status: DC

## 2016-05-29 MED ORDER — EMPAGLIFLOZIN 25 MG PO TABS: 25.0000 mg | ORAL_TABLET | Freq: Every day | ORAL | 3 refills | Status: DC

## 2016-05-29 NOTE — Telephone Encounter (Signed)
Changed to jardiance and prescription ordered

## 2016-06-02 ENCOUNTER — Other Ambulatory Visit: Payer: Self-pay

## 2016-06-02 MED ORDER — SITAGLIP PHOS-METFORMIN HCL ER 100-1000 MG PO TB24: ORAL_TABLET | ORAL | 3 refills | Status: DC

## 2016-06-02 NOTE — Telephone Encounter (Signed)
Spoke with patient and made him aware that janumet was covered and I sent in a prescription- let him know that we are still waiting in invokana approval

## 2016-06-02 NOTE — Telephone Encounter (Signed)
Pt has not heard what he is to do about his meds, his sugars are high

## 2016-06-17 ENCOUNTER — Other Ambulatory Visit: Payer: Self-pay

## 2016-06-17 MED ORDER — EMPAGLIFLOZIN 25 MG PO TABS: 25.0000 mg | ORAL_TABLET | Freq: Every day | ORAL | 3 refills | Status: DC

## 2016-06-17 MED ORDER — SITAGLIPTIN PHOSPHATE 100 MG PO TABS: 100.0000 mg | ORAL_TABLET | Freq: Every day | ORAL | 3 refills | Status: DC

## 2016-06-26 ENCOUNTER — Telehealth: Payer: Self-pay | Admitting: Endocrinology

## 2016-06-26 ENCOUNTER — Other Ambulatory Visit: Payer: BLUE CROSS/BLUE SHIELD

## 2016-06-26 ENCOUNTER — Encounter: Payer: Self-pay | Admitting: Family Medicine

## 2016-06-26 ENCOUNTER — Other Ambulatory Visit (INDEPENDENT_AMBULATORY_CARE_PROVIDER_SITE_OTHER): Payer: Medicaid Other

## 2016-06-26 ENCOUNTER — Ambulatory Visit (INDEPENDENT_AMBULATORY_CARE_PROVIDER_SITE_OTHER): Payer: Medicaid Other | Admitting: Family Medicine

## 2016-06-26 VITALS — BP 114/70 | HR 64 | Temp 98.4°F | Ht 67.0 in | Wt 172.2 lb

## 2016-06-26 DIAGNOSIS — Z87891 Personal history of nicotine dependence: Secondary | ICD-10-CM | POA: Insufficient documentation

## 2016-06-26 DIAGNOSIS — E785 Hyperlipidemia, unspecified: Secondary | ICD-10-CM

## 2016-06-26 DIAGNOSIS — Z789 Other specified health status: Secondary | ICD-10-CM

## 2016-06-26 DIAGNOSIS — Z0000 Encounter for general adult medical examination without abnormal findings: Secondary | ICD-10-CM | POA: Diagnosis not present

## 2016-06-26 DIAGNOSIS — Z8249 Family history of ischemic heart disease and other diseases of the circulatory system: Secondary | ICD-10-CM | POA: Diagnosis not present

## 2016-06-26 DIAGNOSIS — F17200 Nicotine dependence, unspecified, uncomplicated: Secondary | ICD-10-CM | POA: Diagnosis not present

## 2016-06-26 DIAGNOSIS — K219 Gastro-esophageal reflux disease without esophagitis: Secondary | ICD-10-CM | POA: Diagnosis not present

## 2016-06-26 DIAGNOSIS — Z0001 Encounter for general adult medical examination with abnormal findings: Secondary | ICD-10-CM | POA: Insufficient documentation

## 2016-06-26 DIAGNOSIS — E1165 Type 2 diabetes mellitus with hyperglycemia: Secondary | ICD-10-CM | POA: Diagnosis not present

## 2016-06-26 DIAGNOSIS — Z7289 Other problems related to lifestyle: Secondary | ICD-10-CM

## 2016-06-26 LAB — COMPREHENSIVE METABOLIC PANEL
ALBUMIN: 4.6 g/dL (ref 3.5–5.2)
ALT: 21 U/L (ref 0–53)
AST: 19 U/L (ref 0–37)
Alkaline Phosphatase: 84 U/L (ref 39–117)
BUN: 21 mg/dL (ref 6–23)
CHLORIDE: 105 meq/L (ref 96–112)
CO2: 28 mEq/L (ref 19–32)
Calcium: 9.3 mg/dL (ref 8.4–10.5)
Creatinine, Ser: 0.98 mg/dL (ref 0.40–1.50)
GFR: 87.37 mL/min (ref >60.00)
Glucose, Bld: 185 mg/dL — ABNORMAL HIGH (ref 70–99)
POTASSIUM: 4.5 meq/L (ref 3.5–5.1)
SODIUM: 136 meq/L (ref 135–145)
Total Bilirubin: 0.7 mg/dL (ref 0.2–1.2)
Total Protein: 6.8 g/dL (ref 6.0–8.3)

## 2016-06-26 LAB — LIPID PANEL
CHOLESTEROL: 158 mg/dL (ref 0–200)
HDL: 43.5 mg/dL (ref >39.00)
LDL CALC: 92 mg/dL (ref 0–99)
NonHDL: 114.98
Total CHOL/HDL Ratio: 4
Triglycerides: 115 mg/dL (ref 0.0–149.0)
VLDL: 23 mg/dL (ref 0.0–40.0)

## 2016-06-26 LAB — HEMOGLOBIN A1C: HEMOGLOBIN A1C: 8.3 % — AB (ref 4.6–6.5)

## 2016-06-26 MED ORDER — ACYCLOVIR 200 MG PO CAPS: 200.0000 mg | ORAL_CAPSULE | Freq: Three times a day (TID) | ORAL | 3 refills | Status: DC

## 2016-06-26 NOTE — Assessment & Plan Note (Signed)
Chronic, stable. Continue simvastatin. FLP pending.

## 2016-06-26 NOTE — Assessment & Plan Note (Addendum)
Father age 47 MI.  Continue aspirin, statin. Encouraged smoking cessation.  Consider baseline EKG next visit.

## 2016-06-26 NOTE — Assessment & Plan Note (Signed)
Updated med list with current regimen. Not on SGLT2 co-transporter due to insurance coverage. Despite compliance, pt endorses cbg's uncontrolled. Has f/u with endo next week. Due for eye exam.

## 2016-06-26 NOTE — Assessment & Plan Note (Signed)
Stable on daily nexium 20mg  and zantac

## 2016-06-26 NOTE — Patient Instructions (Addendum)
Cherry angiomas and right hydrocele seem stable. You are doing well today Work on quitting smoking.  Good to see you today, call us with questions.  Health Maintenance, Male A healthy lifestyle and preventative care can promote health and wellness.  Maintain regular health, dental, and eye exams.  Eat a healthy diet. Foods like vegetables, fruits, whole grains, low-fat dairy products, and lean protein foods contain the nutrients you need and are low in calories. Decrease your intake of foods high in solid fats, added sugars, and salt. Get information about a proper diet from your health care provider, if necessary.  Regular physical exercise is one of the most important things you can do for your health. Most adults should get at least 150 minutes of moderate-intensity exercise (any activity that increases your heart rate and causes you to sweat) each week. In addition, most adults need muscle-strengthening exercises on 2 or more days a week.   Maintain a healthy weight. The body mass index (BMI) is a screening tool to identify possible weight problems. It provides an estimate of body fat based on height and weight. Your health care provider can find your BMI and can help you achieve or maintain a healthy weight. For males 20 years and older:  A BMI below 18.5 is considered underweight.  A BMI of 18.5 to 24.9 is normal.  A BMI of 25 to 29.9 is considered overweight.  A BMI of 30 and above is considered obese.  Maintain normal blood lipids and cholesterol by exercising and minimizing your intake of saturated fat. Eat a balanced diet with plenty of fruits and vegetables. Blood tests for lipids and cholesterol should begin at age 47 and be repeated every 5 years. If your lipid or cholesterol levels are high, you are over age 47, or you are at high risk for heart disease, you may need your cholesterol levels checked more frequently.Ongoing high lipid and cholesterol levels should be treated  with medicines if diet and exercise are not working.  If you smoke, find out from your health care provider how to quit. If you do not use tobacco, do not start.  Lung cancer screening is recommended for adults aged 55-80 years who are at high risk for developing lung cancer because of a history of smoking. A yearly low-dose CT scan of the lungs is recommended for people who have at least a 30-pack-year history of smoking and are current smokers or have quit within the past 15 years. A pack year of smoking is smoking an average of 1 pack of cigarettes a day for 1 year (for example, a 30-pack-year history of smoking could mean smoking 1 pack a day for 30 years or 2 packs a day for 15 years). Yearly screening should continue until the smoker has stopped smoking for at least 15 years. Yearly screening should be stopped for people who develop a health problem that would prevent them from having lung cancer treatment.  If you choose to drink alcohol, do not have more than 2 drinks per day. One drink is considered to be 12 oz (360 mL) of beer, 5 oz (150 mL) of wine, or 1.5 oz (45 mL) of liquor.  Avoid the use of street drugs. Do not share needles with anyone. Ask for help if you need support or instructions about stopping the use of drugs.  High blood pressure causes heart disease and increases the risk of stroke. High blood pressure is more likely to develop in:  People who  have blood pressure in the end of the normal range (100-139/85-89 mm Hg).  People who are overweight or obese.  People who are African American.  If you are 68-15 years of age, have your blood pressure checked every 3-5 years. If you are 64 years of age or older, have your blood pressure checked every year. You should have your blood pressure measured twice-once when you are at a hospital or clinic, and once when you are not at a hospital or clinic. Record the average of the two measurements. To check your blood pressure when you are  not at a hospital or clinic, you can use:  An automated blood pressure machine at a pharmacy.  A home blood pressure monitor.  If you are 59-30 years old, ask your health care provider if you should take aspirin to prevent heart disease.  Diabetes screening involves taking a blood sample to check your fasting blood sugar level. This should be done once every 3 years after age 50 if you are at a normal weight and without risk factors for diabetes. Testing should be considered at a younger age or be carried out more frequently if you are overweight and have at least 1 risk factor for diabetes.  Colorectal cancer can be detected and often prevented. Most routine colorectal cancer screening begins at the age of 84 and continues through age 21. However, your health care provider may recommend screening at an earlier age if you have risk factors for colon cancer. On a yearly basis, your health care provider may provide home test kits to check for hidden blood in the stool. A small camera at the end of a tube may be used to directly examine the colon (sigmoidoscopy or colonoscopy) to detect the earliest forms of colorectal cancer. Talk to your health care provider about this at age 77 when routine screening begins. A direct exam of the colon should be repeated every 5-10 years through age 45, unless early forms of precancerous polyps or small growths are found.  People who are at an increased risk for hepatitis B should be screened for this virus. You are considered at high risk for hepatitis B if:  You were born in a country where hepatitis B occurs often. Talk with your health care provider about which countries are considered high risk.  Your parents were born in a high-risk country and you have not received a shot to protect against hepatitis B (hepatitis B vaccine).  You have HIV or AIDS.  You use needles to inject street drugs.  You live with, or have sex with, someone who has hepatitis  B.  You are a man who has sex with other men (MSM).  You get hemodialysis treatment.  You take certain medicines for conditions like cancer, organ transplantation, and autoimmune conditions.  Hepatitis C blood testing is recommended for all people born from [birth year] through 1966 and any individual with known risk factors for hepatitis C.  Healthy men should no longer receive prostate-specific antigen (PSA) blood tests as part of routine cancer screening. Talk to your health care provider about prostate cancer screening.  Testicular cancer screening is not recommended for adolescents or adult males who have no symptoms. Screening includes self-exam, a health care provider exam, and other screening tests. Consult with your health care provider about any symptoms you have or any concerns you have about testicular cancer.  Practice safe sex. Use condoms and avoid high-risk sexual practices to reduce the spread of sexually transmitted  infections (STIs).  You should be screened for STIs, including gonorrhea and chlamydia if:  You are sexually active and are younger than 24 years.  You are older than 24 years, and your health care provider tells you that you are at risk for this type of infection.  Your sexual activity has changed since you were last screened, and you are at an increased risk for chlamydia or gonorrhea. Ask your health care provider if you are at risk.  If you are at risk of being infected with HIV, it is recommended that you take a prescription medicine daily to prevent HIV infection. This is called pre-exposure prophylaxis (PrEP). You are considered at risk if:  You are a man who has sex with other men (MSM).  You are a heterosexual man who is sexually active with multiple partners.  You take drugs by injection.  You are sexually active with a partner who has HIV.  Talk with your health care provider about whether you are at high risk of being infected with HIV. If you  choose to begin PrEP, you should first be tested for HIV. You should then be tested every 3 months for as long as you are taking PrEP.  Use sunscreen. Apply sunscreen liberally and repeatedly throughout the day. You should seek shade when your shadow is shorter than you. Protect yourself by wearing long sleeves, pants, a wide-brimmed hat, and sunglasses year round whenever you are outdoors.  Tell your health care provider of new moles or changes in moles, especially if there is a change in shape or color. Also, tell your health care provider if a mole is larger than the size of a pencil eraser.  A one-time screening for abdominal aortic aneurysm (AAA) and surgical repair of large AAAs by ultrasound is recommended for men aged 34-75 years who are current or former smokers.  Stay current with your vaccines (immunizations). This information is not intended to replace advice given to you by your health care provider. Make sure you discuss any questions you have with your health care provider. Document Released: 10/18/2007 Document Revised: 05/12/2014 Document Reviewed: 01/23/2015 Elsevier Interactive Patient Education  2017 [employer].

## 2016-06-26 NOTE — Assessment & Plan Note (Signed)
Reviewed alcohol use , encouraged cessation. Discussed health risks of continued drinking.

## 2016-06-26 NOTE — Progress Notes (Signed)
BP 114/70   Pulse 64   Temp 98.4 F (36.9 C) (Oral)   Ht '5\' 7"'  (1.702 m)   Wt 172 lb 4 oz (78.1 kg)   BMI 26.98 kg/m    CC: CPE Subjective:    Patient ID: Andrew Hart, male    DOB: 06-22-1970, 46 y.o.   MRN: [id number]  HPI: SIAOSI ALTER is a 46 y.o. male presenting on 06/26/2016 for Annual Exam   Changed insurance in the 04/2016 - to [place] [employer].  DM followed by Dr Dwyane Dee endo - has f/u next week. Had labs this morning. Compliant with meds. Insurance did not cover jardiance or invokana. Sugars running high - 170-180s. Having hyperglycemia sxs.  Doing well on statin.  More active at work, increasing exercise.   Preventative: Flu shot yearly pneumovax 2016 Tdap 2014 Seat belt use discussed Sunscreen use discussed. No changing moles on skin. 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 Interim medical history since our last visit reviewed. Allergies and medications reviewed and updated. Outpatient Medications Prior to Visit  Medication Sig Dispense Refill  . aspirin 81 MG tablet Take 81 mg by mouth daily.    Marland Kitchen BAYER MICROLET LANCETS lancets Use as instructed to check blood sugar twice a day dx E11.65 100 each 3  . Blood Glucose Monitoring Suppl (BAYER CONTOUR NEXT MONITOR) w/Device KIT Use to check blood sugar once a day 1 kit 0  . busPIRone (BUSPAR) 10 MG tablet Take 1 tablet (10 mg total) by mouth 2 (two) times daily. 60 tablet 6  . GLIPIZIDE XL 5 MG 24 hr tablet TAKE 1 TABLET BY MOUTH DAILY WITH BREAKFAST. 30 tablet 4  . glucose blood (BAYER CONTOUR NEXT TEST) test strip Use as instructed to check blood sugar twice a day dx code E11.65 100 each 3  . pioglitazone  (ACTOS) 30 MG tablet TAKE 1 TABLET BY MOUTH DAILY. 30 tablet 2  . simvastatin (ZOCOR) 80 MG tablet Take 1 tablet (80 mg total) by mouth daily. 30 tablet 3  . SitaGLIPtin-MetFORMIN HCl (JANUMET XR) 863 600 1284 MG TB24 Take 1 tablet daily 30 tablet 3  . traZODone (DESYREL) 150 MG tablet Take 0.5 tablets (75 mg total) by mouth at bedtime. 45 tablet 3  . RaNITidine HCl (ACID REDUCER PO) Take by mouth.    Marland Kitchen acyclovir (ZOVIRAX) 200 MG capsule Take 200 mg by mouth as needed.     . empagliflozin (JARDIANCE) 25 MG TABS tablet Take 25 mg by mouth daily. (Patient not taking: Reported on 06/26/2016) 30 tablet 3  . INVOKANA 300 MG TABS tablet TAKE 1 TABLET BY MOUTH DAILY BEFORE BREAKFAST. (Patient not taking: Reported on 06/26/2016) 30 tablet 4  . ONGLYZA 5 MG TABS tablet TAKE 1 TABLET BY MOUTH DAILY. (Patient not taking: Reported on 06/26/2016) 30 tablet 3  . sitaGLIPtin (JANUVIA) 100 MG tablet Take 1 tablet (100 mg total) by mouth daily. (Patient not taking: Reported on 06/26/2016) 30 tablet 3  . sitaGLIPtin-metformin (JANUMET) 50-1000 MG tablet Take 1 tablet by mouth 2 (two) times daily with a meal. (Patient not taking: Reported on 06/26/2016) 60 tablet 2   No facility-administered medications prior to visit.  Per HPI unless specifically indicated in ROS section below Review of Systems  Constitutional: Negative for activity change, appetite change, chills, fatigue, fever and unexpected weight change.  HENT: Negative for hearing loss.   Eyes: Negative for visual disturbance.  Respiratory: Positive for cough (?smoker's). Negative for chest tightness, shortness of breath and wheezing.   Cardiovascular: Negative for chest pain, palpitations and leg swelling.  Gastrointestinal: Negative for abdominal distention, abdominal pain, blood in stool, constipation, diarrhea, nausea and vomiting.  Genitourinary: Negative for difficulty urinating and hematuria.  Musculoskeletal: Negative for arthralgias, myalgias and  neck pain.  Skin: Negative for rash.  Neurological: Negative for dizziness, seizures, syncope and headaches.  Hematological: Negative for adenopathy. Does not bruise/bleed easily.  Psychiatric/Behavioral: Negative for dysphoric mood. The patient is not nervous/anxious.        Objective:    BP 114/70   Pulse 64   Temp 98.4 F (36.9 C) (Oral)   Ht '5\' 7"'  (1.702 m)   Wt 172 lb 4 oz (78.1 kg)   BMI 26.98 kg/m   Wt Readings from Last 3 Encounters:  06/26/16 172 lb 4 oz (78.1 kg)  04/01/16 168 lb (76.2 kg)  12/27/15 173 lb (78.5 kg)    Physical Exam  Constitutional: He is oriented to person, place, and time. He appears well-developed and well-nourished. No distress.  HENT:  Head: Normocephalic and atraumatic.  Right Ear: Hearing, tympanic membrane, external ear and ear canal normal.  Left Ear: Hearing, tympanic membrane, external ear and ear canal normal.  Nose: Nose normal.  Mouth/Throat: Uvula is midline, oropharynx is clear and moist and mucous membranes are normal. No oropharyngeal exudate, posterior oropharyngeal edema or posterior oropharyngeal erythema.  Eyes: Conjunctivae and EOM are normal. Pupils are equal, round, and reactive to light. No scleral icterus.  Neck: Normal range of motion. Neck supple. Carotid bruit is not present. No thyromegaly present.  Cardiovascular: Normal rate, regular rhythm, normal heart sounds and intact distal pulses.   No murmur heard. Pulses:      Radial pulses are 2+ on the right side, and 2+ on the left side.  Pulmonary/Chest: Effort normal and breath sounds normal. No respiratory distress. He has no wheezes. He has no rales.  Abdominal: Soft. Bowel sounds are normal. He exhibits no distension and no mass. There is no tenderness. There is no rebound and no guarding.  Musculoskeletal: Normal range of motion. He exhibits no edema.  Lymphadenopathy:    He has no cervical adenopathy.  Neurological: He is alert and oriented to person, place, and  time.  CN grossly intact, station and gait intact  Skin: Skin is warm and dry. No rash noted.  Psychiatric: He has a normal mood and affect. His behavior is normal. Judgment and thought content normal.  Nursing note and vitals reviewed.  Results for orders placed or performed in visit on 04/01/16  Urine Microalbumin w/creat. ratio  Result Value Ref Range   Microalb, Ur <0.7 0.0 - 1.9 mg/dL   Creatinine,U 122.5 mg/dL   Microalb Creat Ratio 0.6 0.0 - 30.0 mg/g      Assessment & Plan:  Discussed insurance status with patient - we do not contract with [place] access. Pt will look into switching to plain medicaid or changing insurance if feasible as he would like to continue coming to our office. [place] access provider list provided as well.  Problem List Items Addressed This Visit    Alcohol use    Reviewed alcohol use , encouraged cessation. Discussed  health risks of continued drinking.      Family history of abdominal aortic aneurysm (AAA)    Father, unsure age. Discussed screening.       Family history of premature CAD    Father age 32 MI.  Continue aspirin, statin. Encouraged smoking cessation.  Consider baseline EKG next visit.       GERD (gastroesophageal reflux disease)    Stable on daily nexium 66m and zantac      Relevant Medications   esomeprazole (NEXIUM) 20 MG capsule   Health maintenance examination - Primary    Preventative protocols reviewed and updated unless pt declined. Discussed healthy diet and lifestyle.       Hyperlipidemia    Chronic, stable. Continue simvastatin. FLP pending.      Smoker    Encouraged cessation in setting of fmhx CAD      Uncontrolled type 2 diabetes mellitus with hyperglycemia, without long-term current use of insulin ([place]    Updated med list with current regimen. Not on SGLT2 co-transporter due to insurance coverage. Despite compliance, pt endorses cbg's uncontrolled. Has f/u with endo next week. Due for eye exam.             Follow up plan: Return if symptoms worsen or fail to improve.  JRia Bush MD

## 2016-06-26 NOTE — Progress Notes (Signed)
Pre visit review using our clinic review tool, if applicable. No additional management support is needed unless otherwise documented below in the visit note. 

## 2016-06-26 NOTE — Assessment & Plan Note (Signed)
Preventative protocols reviewed and updated unless pt declined. Discussed healthy diet and lifestyle.  

## 2016-06-26 NOTE — Assessment & Plan Note (Signed)
Encouraged cessation in setting of fmhx CAD

## 2016-06-26 NOTE — Assessment & Plan Note (Signed)
Father, unsure age. Discussed screening.

## 2016-06-26 NOTE — Telephone Encounter (Signed)
Hailey from [employer] calling, patient need a [facility]   Phone # [phone]   REF # [id number]

## 2016-06-30 NOTE — Telephone Encounter (Signed)
[employer] calling on the status of PA for medication Jardiance

## 2016-07-02 ENCOUNTER — Ambulatory Visit (INDEPENDENT_AMBULATORY_CARE_PROVIDER_SITE_OTHER): Payer: Medicaid Other | Admitting: Endocrinology

## 2016-07-02 ENCOUNTER — Encounter: Payer: Self-pay | Admitting: Endocrinology

## 2016-07-02 VITALS — BP 122/76 | HR 64 | Ht 67.0 in | Wt 175.0 lb

## 2016-07-02 DIAGNOSIS — E1165 Type 2 diabetes mellitus with hyperglycemia: Secondary | ICD-10-CM

## 2016-07-02 NOTE — Progress Notes (Signed)
Patient ID: Andrew Hart, male   DOB: 1970/09/04, 46 y.o.   MRN: [id number]           Reason for Appointment: Follow-up for Type 2 Diabetes   History of Present Illness:          Date of diagnosis of type 2 diabetes mellitus: 2007 ?        Background history:  He was diagnosed with routine screening lab work He was initially tried on metformin but this caused diarrhea and he could not continue this Subsequently was treated with glipizide and apparently this was able to control his blood sugars fairly well for a few years Probably in early 2016 he was given Actos in addition to help his glucose control as A1c had gone up to 9.4 This helped transiently and A1c was 7.7 However his A1c has been otherwise over 8% since mid 2015  Recent history:  Non-insulin hypoglycemic drugs the patient is taking are: Glipizide ER 5 mg daily, Actos 30 mg daily, Janumet XR 100/1000 daily  Because of  high blood sugars on his initial consultation in 1/16 (A1c was 9.1) he was started on Invokana and Onglyza in addition to his Actos and glipizide  His A1c is not controlled and still over 7%, now 8.3  Current blood sugar patterns and problems identified:   He is checking his blood sugars at different times but more in the morning  Highest blood sugars are at bedtime but morning sugars can be as high as 199 also  With not being able to get Invokana with Medicaid his blood sugars are higher  Also he has gained weight which he thinks is from not being active through the winter months  He thinks he is trying to watch his diet fairly well and has been instructed by dietitian about a year ago No symptoms of hypoglycemia with 5 mg of glipizide Also he is tolerating Janumet XR with no significant diarrhea using the 1000 mg metformin, previously had diarrhea with regular metformin       Side effects from medications have been: Diarrhea from regular metformin  Compliance with the medical regimen:  Fair Hypoglycemia: None   Glucose monitoring:  done 0-1  times a day         Glucometer:  Contour    Blood Glucose readings  by download  Mean values apply above for all meters except median for One Touch  PRE-MEAL Fasting Lunch Dinner Bedtime Overall  Glucose range: 161-199  174, 208  100  223, 248    Mean/median: 178     181     Self-care: The diet that the patient has been following is: tries to limit sweets      Typical meal intake: Breakfast is a breakfast bar on weekdays, eggs, bacon and grits on weekends Lunch = sandwich, soup Dinner =  chicken and pasta.  Snacks will be nuts and cheese Now having less regular soft drinks                 Dietician visit, most recent: 06/2015               Exercise: less Active   Weight history: usual Previous weight 170   Wt Readings from Last 3 Encounters:  07/02/16 175 lb (79.4 kg)  06/26/16 172 lb 4 oz (78.1 kg)  04/01/16 168 lb (76.2 kg)    Glycemic control:    Lab Results  Component Value Date   HGBA1C 8.3 (  H) 06/26/2016   HGBA1C 7.4 (H) 03/25/2016   HGBA1C 7.5 (H) 12/25/2015   Lab Results  Component Value Date   MICROALBUR <0.7 04/01/2016   LDLCALC 92 06/26/2016   CREATININE 0.98 06/26/2016    Lab on 06/26/2016  Component Date Value Ref Range Status  . Hgb A1c MFr Bld 06/26/2016 8.3* 4.6 - 6.5 % Final  . Sodium 06/26/2016 136  135 - 145 mEq/L Final  . Potassium 06/26/2016 4.5  3.5 - 5.1 mEq/L Final  . Chloride 06/26/2016 105  96 - 112 mEq/L Final  . CO2 06/26/2016 28  19 - 32 mEq/L Final  . Glucose, Bld 06/26/2016 185* 70 - 99 mg/dL Final  . BUN 06/26/2016 21  6 - 23 mg/dL Final  . Creatinine, Ser 06/26/2016 0.98  0.40 - 1.50 mg/dL Final  . Total Bilirubin 06/26/2016 0.7  0.2 - 1.2 mg/dL Final  . Alkaline Phosphatase 06/26/2016 84  39 - 117 U/L Final  . AST 06/26/2016 19  0 - 37 U/L Final  . ALT 06/26/2016 21  0 - 53 U/L Final  . Total Protein 06/26/2016 6.8  6.0 - 8.3 g/dL Final  . Albumin 06/26/2016 4.6   3.5 - 5.2 g/dL Final  . Calcium 06/26/2016 9.3  8.4 - 10.5 mg/dL Final  . GFR 06/26/2016 87.37  >60.00 mL/min Final  . Cholesterol 06/26/2016 158  0 - 200 mg/dL Final  . Triglycerides 06/26/2016 115.0  0.0 - 149.0 mg/dL Final  . HDL 06/26/2016 43.50  >39.00 mg/dL Final  . VLDL 06/26/2016 23.0  0.0 - 40.0 mg/dL Final  . LDL Cholesterol 06/26/2016 92  0 - 99 mg/dL Final  . Total CHOL/HDL Ratio 06/26/2016 4   Final  . NonHDL 06/26/2016 114.98   Final      Allergies as of 07/02/2016   No Known Allergies     Medication List       Accurate as of 07/02/16 10:16 AM. Always use your most recent med list.          ACID REDUCER PO Take by mouth.   acyclovir 200 MG capsule Commonly known as:  ZOVIRAX Take 1 capsule (200 mg total) by mouth 3 (three) times daily. For 3-5 days as needed   aspirin 81 MG tablet Take 81 mg by mouth daily.   BAYER CONTOUR NEXT MONITOR w/Device Kit Use to check blood sugar once a day   BAYER MICROLET LANCETS lancets Use as instructed to check blood sugar twice a day dx E11.65   busPIRone 10 MG tablet Commonly known as:  BUSPAR Take 1 tablet (10 mg total) by mouth 2 (two) times daily.   esomeprazole 20 MG capsule Commonly known as:  NEXIUM Take 20 mg by mouth daily at 12 noon.   GLIPIZIDE XL 5 MG 24 hr tablet Generic drug:  glipiZIDE TAKE 1 TABLET BY MOUTH DAILY WITH BREAKFAST.   glucose blood test strip Commonly known as:  BAYER CONTOUR NEXT TEST Use as instructed to check blood sugar twice a day dx code E11.65   pioglitazone 30 MG tablet Commonly known as:  ACTOS TAKE 1 TABLET BY MOUTH DAILY.   simvastatin 80 MG tablet Commonly known as:  ZOCOR Take 1 tablet (80 mg total) by mouth daily.   SitaGLIPtin-MetFORMIN HCl [phone] MG Tb24 Commonly known as:  JANUMET XR Take 1 tablet daily   traZODone 150 MG tablet Commonly known as:  DESYREL Take 0.5 tablets (75 mg total) by mouth at bedtime.  Allergies: No Known  Allergies  Past Medical History:  Diagnosis Date  . Alcohol use 11/08/2015  . Diabetes mellitus without complication ([place]) 2694   Kumar  . GERD (gastroesophageal reflux disease)   . Herpes labialis   . History of chicken pox   . Hyperlipidemia     Past Surgical History:  Procedure Laterality Date  . APPENDECTOMY    . FINGER SURGERY Right    2 screws in ring finger  . TONSILLECTOMY      Family History  Problem Relation Age of Onset  . Diabetes Mother   . CAD Father 68    MI at 47, CABG at 29  . Diabetes Maternal Grandmother   . Parkinsonism Mother 61    progressive supranuclear palsy, deceased  . Stroke Neg Hx   . Cancer Neg Hx     Social History:  reports that he has been smoking Cigarettes.  He has a 7.50 pack-year smoking history. He has never used smokeless tobacco. He reports that he drinks alcohol. He reports that he uses drugs.    Review of Systems    Lipid history:  Has been on simvastatin for treatment for some time His lipids look good except for low HDL    Lab Results  Component Value Date   CHOL 158 06/26/2016   HDL 43.50 06/26/2016   LDLCALC 92 06/26/2016   TRIG 115.0 06/26/2016   CHOLHDL 4 06/26/2016           Most recent eye exam was in 04/2015  Most recent foot exam: 1/17  Review of Systems    Physical Examination:  BP 122/76   Pulse 64   Ht 5' 7" (1.702 m)   Wt 175 lb (79.4 kg)   SpO2 96%   BMI 27.41 kg/m   ASSESSMENT:  Diabetes type 2, BMI 28   See history of present illness for detailed discussion of current diabetes management, blood sugar patterns and problems identified  He is currently on a multidrug regimen of Janumet XR, glipizide and Actos His sugars were much better with adding Invokana to his regimen and he cannot get this on his insurance now  A1c is 8.3, previously 7.4  He has however started checking his blood sugars more recently and these are mostly high except relatively better in the afternoon Has gained  some weight partly from decreased activity Blood sugars are high fasting and bedtime also  Hyperlipidemia: Well controlled  PLAN:     Will get prior authorization for Jardiance 25 mg  Continue current dose of Janumet XR, he likely will not tolerate a higher dose of metformin  Will not increase his glipizide because of potential for hypoglycemia especially when he starts getting more active  Follow-up in 3 months unless he has problems with inadequate control   Patient Instructions  Check blood sugars on waking up    Also check blood sugars about 2 hours after a meal and do this after different meals by rotation  Recommended blood sugar levels on waking up is 90-130 and about 2 hours after meal is 130-160  Please bring your blood sugar monitor to each visit, thank you     [facility] 07/02/2016, 10:16 AM   Note: This office note was prepared with Dragon voice recognition system technology. Any transcriptional errors that result from this process are unintentional.

## 2016-07-02 NOTE — Patient Instructions (Signed)
Check blood sugars on waking up    Also check blood sugars about 2 hours after a meal and do this after different meals by rotation  Recommended blood sugar levels on waking up is 90-130 and about 2 hours after meal is 130-160  Please bring your blood sugar monitor to each visit, thank you  

## 2016-07-03 NOTE — Telephone Encounter (Signed)
Patient is calling on the status of PA  Please advise

## 2016-07-04 NOTE — Telephone Encounter (Signed)
Approved.  

## 2016-07-17 ENCOUNTER — Other Ambulatory Visit: Payer: Self-pay | Admitting: Endocrinology

## 2016-09-02 ENCOUNTER — Other Ambulatory Visit: Payer: Self-pay | Admitting: Family Medicine

## 2016-09-02 NOTE — Telephone Encounter (Signed)
Received refill electronically Last refill 08/31/15 #45/3 Last office visit 07/02/16

## 2016-09-11 ENCOUNTER — Other Ambulatory Visit: Payer: Self-pay | Admitting: Family Medicine

## 2016-09-11 ENCOUNTER — Other Ambulatory Visit: Payer: Self-pay | Admitting: Endocrinology

## 2016-10-01 ENCOUNTER — Ambulatory Visit: Payer: Medicaid Other | Admitting: Endocrinology

## 2016-10-01 ENCOUNTER — Other Ambulatory Visit (INDEPENDENT_AMBULATORY_CARE_PROVIDER_SITE_OTHER): Payer: Medicaid Other

## 2016-10-01 DIAGNOSIS — E1165 Type 2 diabetes mellitus with hyperglycemia: Secondary | ICD-10-CM | POA: Diagnosis not present

## 2016-10-01 LAB — COMPREHENSIVE METABOLIC PANEL
ALBUMIN: 4.3 g/dL (ref 3.5–5.2)
ALT: 17 U/L (ref 0–53)
AST: 15 U/L (ref 0–37)
Alkaline Phosphatase: 75 U/L (ref 39–117)
BILIRUBIN TOTAL: 0.5 mg/dL (ref 0.2–1.2)
BUN: 15 mg/dL (ref 6–23)
CALCIUM: 9.4 mg/dL (ref 8.4–10.5)
CHLORIDE: 107 meq/L (ref 96–112)
CO2: 30 meq/L (ref 19–32)
CREATININE: 1 mg/dL (ref 0.40–1.50)
GFR: 85.25 mL/min (ref >60.00)
Glucose, Bld: 146 mg/dL — ABNORMAL HIGH (ref 70–99)
Potassium: 4.1 mEq/L (ref 3.5–5.1)
Sodium: 140 mEq/L (ref 135–145)
Total Protein: 6.6 g/dL (ref 6.0–8.3)

## 2016-10-01 LAB — HEMOGLOBIN A1C: Hgb A1c MFr Bld: 7 % — ABNORMAL HIGH (ref 4.6–6.5)

## 2016-10-03 ENCOUNTER — Ambulatory Visit (INDEPENDENT_AMBULATORY_CARE_PROVIDER_SITE_OTHER): Payer: Medicaid Other | Admitting: Endocrinology

## 2016-10-03 ENCOUNTER — Other Ambulatory Visit: Payer: Self-pay

## 2016-10-03 ENCOUNTER — Encounter: Payer: Self-pay | Admitting: Endocrinology

## 2016-10-03 VITALS — BP 122/82 | HR 65 | Ht 67.0 in | Wt 163.6 lb

## 2016-10-03 DIAGNOSIS — E1165 Type 2 diabetes mellitus with hyperglycemia: Secondary | ICD-10-CM | POA: Diagnosis not present

## 2016-10-03 MED ORDER — GLIPIZIDE ER 2.5 MG PO TB24: 2.5000 mg | ORAL_TABLET | Freq: Every day | ORAL | 3 refills | Status: DC

## 2016-10-03 MED ORDER — PIOGLITAZONE HCL 30 MG PO TABS: 30.0000 mg | ORAL_TABLET | Freq: Every day | ORAL | 3 refills | Status: DC

## 2016-10-03 NOTE — Progress Notes (Signed)
Patient ID: Andrew Hart, male   DOB: 03/18/1971, 46 y.o.   MRN: [id number]           Reason for Appointment: Follow-up for Type 2 Diabetes   History of Present Illness:          Date of diagnosis of type 2 diabetes mellitus: 2007 ?        Background history:  He was diagnosed with routine screening lab work He was initially tried on metformin but this caused diarrhea and he could not continue this Subsequently was treated with glipizide and apparently this was able to control his blood sugars fairly well for a few years Probably in early 2016 he was given Actos in addition to help his glucose control as A1c had gone up to 9.4 This helped transiently and A1c was 7.7 However his A1c has been otherwise over 8% since mid 2015  Recent history:  Non-insulin hypoglycemic drugs the patient is taking are: Glipizide ER 5 mg daily, Actos 30 mg daily, Janumet XR 100/1000 daily  Because of  high blood sugars on his initial consultation in 05/2014 (A1c was 9.1) he was started on Invokana and Onglyza in addition to his Actos and glipizide.   Subsequently he could not get Invokana and is now able to get Jardiance since his visit in 06/2016 Also he is tolerating Janumet XR with no significant diarrhea using the 1000 mg metformin, previously had diarrhea with regular metformin  His A1c is now controlled and back to 7, previously 8.3  Current blood sugar patterns and problems identified:   He is checking his blood sugars at different times but did not bring his monitor for download  He thinks his blood sugars are fairly good both fasting and after meals and his lab glucose was 146 after breakfast  He now says that since he has been more active at work he will feel hypoglycemic about twice a week needing a snack either before lunch or supper  Does not monitor his blood sugar when he is symptomatic and not clear how often he is checking after meals also  He has lost a significant amount of weight,  about 12 pounds since his last visit with starting Jardiance and being more active  He thinks he is usually trying to watch his diet with portions, trying to reduce regular drinks but not always beer         Side effects from medications have been: Diarrhea from regular metformin  Compliance with the medical regimen: Fair Hypoglycemia: None   Glucose monitoring:  done 0-1  times a day         Glucometer:  Contour    Blood Glucose readings  by recall have been in the 99-150 range at different times   Self-care: The diet that the patient has been following is: tries to limit sweets      Typical meal intake: Breakfast is a breakfast bar on weekdays, eggs, bacon and grits on weekends Lunch = sandwich, soup Dinner =  chicken and pasta.  Snacks will be nuts and cheese                Dietician visit, most recent: 06/2015               Exercise:   Weight history:    Wt Readings from Last 3 Encounters:  10/03/16 163 lb 9.6 oz (74.2 kg)  07/02/16 175 lb (79.4 kg)  06/26/16 172 lb 4 oz (78.1 kg)  Glycemic control:    Lab Results  Component Value Date   HGBA1C 7.0 (H) 10/01/2016   HGBA1C 8.3 (H) 06/26/2016   HGBA1C 7.4 (H) 03/25/2016   Lab Results  Component Value Date   MICROALBUR <0.7 04/01/2016   LDLCALC 92 06/26/2016   CREATININE 1.00 10/01/2016    Appointment on 10/01/2016  Component Date Value Ref Range Status  . Hgb A1c MFr Bld 10/01/2016 7.0* 4.6 - 6.5 % Final   Glycemic Control Guidelines for People with Diabetes:Non Diabetic:  <6%Goal of Therapy: <7%Additional Action Suggested:  >8%   . Sodium 10/01/2016 140  135 - 145 mEq/L Final  . Potassium 10/01/2016 4.1  3.5 - 5.1 mEq/L Final  . Chloride 10/01/2016 107  96 - 112 mEq/L Final  . CO2 10/01/2016 30  19 - 32 mEq/L Final  . Glucose, Bld 10/01/2016 146* 70 - 99 mg/dL Final  . BUN 10/01/2016 15  6 - 23 mg/dL Final  . Creatinine, Ser 10/01/2016 1.00  0.40 - 1.50 mg/dL Final  . Total Bilirubin 10/01/2016 0.5  0.2  - 1.2 mg/dL Final  . Alkaline Phosphatase 10/01/2016 75  39 - 117 U/L Final  . AST 10/01/2016 15  0 - 37 U/L Final  . ALT 10/01/2016 17  0 - 53 U/L Final  . Total Protein 10/01/2016 6.6  6.0 - 8.3 g/dL Final  . Albumin 10/01/2016 4.3  3.5 - 5.2 g/dL Final  . Calcium 10/01/2016 9.4  8.4 - 10.5 mg/dL Final  . GFR 10/01/2016 85.25  >60.00 mL/min Final      Allergies as of 10/03/2016   No Known Allergies     Medication List       Accurate as of 10/03/16 11:59 PM. Always use your most recent med list.          ACID REDUCER PO Take by mouth.   acyclovir 200 MG capsule Commonly known as:  ZOVIRAX Take 1 capsule (200 mg total) by mouth 3 (three) times daily. For 3-5 days as needed   aspirin 81 MG tablet Take 81 mg by mouth daily.   BAYER CONTOUR NEXT MONITOR w/Device Kit Use to check blood sugar once a day   BAYER MICROLET LANCETS lancets Use as instructed to check blood sugar twice a day dx E11.65   busPIRone 10 MG tablet Commonly known as:  BUSPAR Take 1 tablet (10 mg total) by mouth 2 (two) times daily.   esomeprazole 20 MG capsule Commonly known as:  NEXIUM Take 20 mg by mouth daily at 12 noon.   glipiZIDE 2.5 MG 24 hr tablet Commonly known as:  GLUCOTROL XL Take 1 tablet (2.5 mg total) by mouth daily with breakfast.   glucose blood test strip Commonly known as:  BAYER CONTOUR NEXT TEST Use as instructed to check blood sugar twice a day dx code E11.65   JARDIANCE 25 MG Tabs tablet Generic drug:  empagliflozin Take 25 mg by mouth daily.   pioglitazone 30 MG tablet Commonly known as:  ACTOS Take 1 tablet (30 mg total) by mouth daily.   simvastatin 80 MG tablet Commonly known as:  ZOCOR TAKE 1 TABLET BY MOUTH DAILY.   SitaGLIPtin-MetFORMIN HCl [phone] MG Tb24 Commonly known as:  JANUMET XR Take 1 tablet daily   traZODone 150 MG tablet Commonly known as:  DESYREL TAKE 1/2 TABLET (75 MG TOTAL) BY MOUTH AT BEDTIME.       Allergies: No Known  Allergies  Past Medical History:  Diagnosis Date  .  Alcohol use 11/08/2015  . Diabetes mellitus without complication ([place]) 2334   Gloris Shiroma  . GERD (gastroesophageal reflux disease)   . Herpes labialis   . History of chicken pox   . Hyperlipidemia     Past Surgical History:  Procedure Laterality Date  . APPENDECTOMY    . FINGER SURGERY Right    2 screws in ring finger  . TONSILLECTOMY      Family History  Problem Relation Age of Onset  . Diabetes Mother   . CAD Father 58       MI at 25, CABG at 76  . Diabetes Maternal Grandmother   . Parkinsonism Mother 74       progressive supranuclear palsy, deceased  . Stroke Neg Hx   . Cancer Neg Hx     Social History:  reports that he has been smoking Cigarettes.  He has a 7.50 pack-year smoking history. He has never used smokeless tobacco. He reports that he drinks alcohol. He reports that he uses drugs.    Review of Systems    Lipid history:  Has been on 80 mg simvastatin for treatment, prescribed by PCP  His lipids are well controlled    Lab Results  Component Value Date   CHOL 158 06/26/2016   HDL 43.50 06/26/2016   LDLCALC 92 06/26/2016   TRIG 115.0 06/26/2016   CHOLHDL 4 06/26/2016           Most recent eye exam was in 04/2015  Most recent foot exam: 5/18  Review of Systems    Physical Examination:  BP 122/82   Pulse 65   Ht _0  (1.702 m)   Wt 163 lb 9.6 oz (74.2 kg)   SpO2 97%   BMI 25.62 kg/m   Diabetic Foot Exam - Simple   Simple Foot Form Diabetic Foot exam was performed with the following findings:  Yes   Visual Inspection No deformities, no ulcerations, no other skin breakdown bilaterally:  Yes Sensation Testing Intact to touch and monofilament testing bilaterally:  Yes Pulse Check Posterior Tibialis and Dorsalis pulse intact bilaterally:  Yes Comments     ASSESSMENT:  Diabetes type 2, BMI 28   See history of present illness for detailed discussion of current diabetes management, blood  sugar patterns and problems identified  He is currently on a multidrug regimen of Jardiance in addition to Janumet XR, glipizide ER 5 mg and Actos His sugars are much better with adding Jardiance on his last visit and he is able to get this without insurance obstacles He has lost a significant amount of weight with this and being more active He reports fairly good blood sugars at home although may have some tendency to hypoglycemia now with losing weight and better blood sugars  A1c has come down to 7%, was 8.3   PLAN:     Trial of glipizide ER 2.5 mg instead of 5  Continue current dose of other medications  Monitor blood sugars at various times including after meals  He will call if there is any significant change in glucose  Consistent diet and moderation of high fat intake and alcoholic intake He will get his eye exam and have report forwarded   There are no Patient Instructions on file for this visit.   Harvey Lingo 10/05/2016, 6:05 PM   Note: This office note was prepared with Dragon voice recognition system technology. Any transcriptional errors that result from this process are unintentional.

## 2016-10-05 ENCOUNTER — Encounter: Payer: Self-pay | Admitting: Endocrinology

## 2016-10-06 ENCOUNTER — Other Ambulatory Visit: Payer: Self-pay | Admitting: Endocrinology

## 2016-11-07 ENCOUNTER — Telehealth: Payer: Self-pay | Admitting: Endocrinology

## 2016-11-07 NOTE — Telephone Encounter (Signed)
Called patient and asked him to call his insurance to see which of the medications provided is covered by his insurance and to call us back to let us know. He stated that about 2 weeks after he started the Jardiance that the rash started and has not stopped. He stated that he is no longer taking this medication.

## 2016-11-07 NOTE — Telephone Encounter (Signed)
Patient believes that jardiance is causing him to have a rash. Pt has a rash on his neck, check, back and collarbone area. He said that it started about 2 1/2 months ago right after starting medication. He also states that he has heartburn and wonders if that is also an effect of the medicine.

## 2016-11-07 NOTE — Telephone Encounter (Signed)
Please advise 

## 2016-11-07 NOTE — Telephone Encounter (Signed)
He will need to find out if Invokana or Andrew DeistFarxiga will be covered by his insurance and we can change

## 2016-11-10 ENCOUNTER — Other Ambulatory Visit: Payer: Self-pay

## 2016-11-10 MED ORDER — CANAGLIFLOZIN 300 MG PO TABS: 300.0000 mg | ORAL_TABLET | Freq: Every day | ORAL | 3 refills | Status: DC

## 2016-11-10 NOTE — Telephone Encounter (Signed)
Called patient and let him know that I have sent in his Invokana. Patient asked if he is supposed to keep taking the Janumet XR? He stated that when he was on Invokana before he was not taking the Janumet with it. Please advise.

## 2016-11-10 NOTE — Telephone Encounter (Signed)
 300mg  qd

## 2016-11-10 NOTE — Telephone Encounter (Signed)
Continue Janumet XR, this is not related

## 2016-11-10 NOTE — Telephone Encounter (Signed)
Patient called and stated that he called his insurance company and they will cover Invokana and [place]. Patient stated that he would like to go with the Invokana and he stated that his insurance will cover the 100mg  and the 300mg . Please advise dosage. He would like to be sent to [facility].

## 2016-11-10 NOTE — Telephone Encounter (Signed)
The patient has been informed of this

## 2016-11-11 ENCOUNTER — Telehealth: Payer: Self-pay

## 2016-11-11 NOTE — Telephone Encounter (Signed)
For clarification- per the patient he can not take jardiance because it causes a rash as explained in previous notes please advise on how to proceed

## 2016-11-11 NOTE — Telephone Encounter (Signed)
PA for Invokana

## 2016-11-11 NOTE — Telephone Encounter (Signed)
I don't think Invokana is covered since PA is required, will need to do this, I believe [place] PepperJardiance is preferred

## 2016-11-11 NOTE — Telephone Encounter (Signed)
Spoke with the patient this morning and he stated he has contacted the insurance company and was told that Andrew Hart was covered he does not want to take Jardiance due to the rash he got that was explained in the note on 11/07/16 please advise on how to proceed with this

## 2016-11-12 ENCOUNTER — Other Ambulatory Visit: Payer: Self-pay

## 2016-11-12 ENCOUNTER — Telehealth: Payer: Self-pay

## 2016-11-12 MED ORDER — DAPAGLIFLOZIN PROPANEDIOL 10 MG PO TABS: 10.0000 mg | ORAL_TABLET | Freq: Every day | ORAL | 3 refills | Status: DC

## 2016-11-12 MED ORDER — DAPAGLIFLOZIN PROPANEDIOL 10 MG PO TABS: 10.0000 mg | ORAL_TABLET | Freq: Every day | ORAL | 4 refills | Status: DC

## 2016-11-12 NOTE — Telephone Encounter (Signed)
erroe

## 2016-11-12 NOTE — Telephone Encounter (Signed)
farxiga has been ordered and patient informed

## 2016-11-12 NOTE — Telephone Encounter (Signed)
Dr. Lucianne MussKumar prescribed Marcelline DeistFarxiga for this patient, I sent in the prescription, and informed the patient- I requested the patient call us back if he has any adverse reactions or has any questions

## 2016-11-12 NOTE — Telephone Encounter (Signed)
I initiated a PA for Invokana through [employer]- Samantha stated that patient had to try and fail 2 meds and try and fail 1 metformin containing med- I made the patient aware of this and relayed that information on to the doctor as well- paper on Dr. Remus BlakeKumar's desk to advise me on what needs to be done from here. I should get response from [facility] in 24 hours

## 2016-11-12 NOTE — Telephone Encounter (Signed)
error 

## 2016-11-17 ENCOUNTER — Telehealth: Payer: Self-pay

## 2016-11-17 NOTE — Telephone Encounter (Signed)
Spoke with erica from [employer] to complete new PA since patient has tried and failed jardiance and invokana- patient was approved from 11/17/16 to 11/12/17 the PA number is [id number] I have informed the patient of this. Patient stated he will pick this medication up today and will call and inform us if he has any adverse reactions to farxiga

## 2016-12-02 ENCOUNTER — Other Ambulatory Visit: Payer: Self-pay | Admitting: Family Medicine

## 2016-12-18 ENCOUNTER — Encounter: Payer: Self-pay | Admitting: Family Medicine

## 2016-12-18 ENCOUNTER — Ambulatory Visit (INDEPENDENT_AMBULATORY_CARE_PROVIDER_SITE_OTHER): Payer: Self-pay | Admitting: Family Medicine

## 2016-12-18 VITALS — BP 114/74 | HR 61 | Temp 98.3°F | Wt 164.2 lb

## 2016-12-18 DIAGNOSIS — R21 Rash and other nonspecific skin eruption: Secondary | ICD-10-CM

## 2016-12-18 DIAGNOSIS — L281 Prurigo nodularis: Secondary | ICD-10-CM | POA: Insufficient documentation

## 2016-12-18 MED ORDER — DEXAMETHASONE SODIUM PHOSPHATE 10 MG/ML IJ SOLN
10.0000 mg | Freq: Once | INTRAMUSCULAR | Status: AC
  Administered 2016-12-18: 10 mg via INTRAMUSCULAR

## 2016-12-18 MED ORDER — CLOBETASOL PROPIONATE 0.05 % EX CREA: 1.0000 "application " | TOPICAL_CREAM | Freq: Two times a day (BID) | CUTANEOUS | 0 refills | Status: DC

## 2016-12-18 NOTE — Progress Notes (Signed)
BP 114/74   Pulse 61   Temp 98.3 F (36.8 C) (Oral)   Wt 164 lb 4 oz (74.5 kg)   SpO2 95%   BMI 25.73 kg/m    CC: f/u rash Subjective:    Patient ID: Andrew Hart, male    DOB: 02-20-1971, 46 y.o.   MRN: [id number]  HPI: Andrew Hart is a 46 y.o. male presenting on 12/18/2016 for Follow-up (urgent care for rash)   3 months ago got into poison ivy - developed rash on chest and back that has never fully resolved. Seen at [facility] 3 wks ago - treated with steroid cream with some improvement, but ongoing rash. Describes poorly healing pruritic papules.   Skin rash R lower leg that won't heal. Currently using new skin to area.  No other rashes at home. No new lotions, detergents, soaps or shampoos. No new foods.  Did recently start [place], [place].   Continues smoking 1/2 ppd.   DM - followed by endo. Fasting sugars 130-160, postprandial 150-175. Invokana was helping but no longer covered by insurance. Now on farxiga, actos, janumet, and glimpizide.   Relevant past medical, surgical, family and social history reviewed and updated as indicated. Interim medical history since our last visit reviewed. Allergies and medications reviewed and updated. Outpatient Medications Prior to Visit  Medication Sig Dispense Refill  . acyclovir (ZOVIRAX) 200 MG capsule Take 1 capsule (200 mg total) by mouth 3 (three) times daily. For 3-5 days as needed 30 capsule 3  . aspirin 81 MG tablet Take 81 mg by mouth daily.    Marland Kitchen BAYER MICROLET LANCETS lancets Use as instructed to check blood sugar twice a day dx E11.65 100 each 3  . Blood Glucose Monitoring Suppl (BAYER CONTOUR NEXT MONITOR) w/Device KIT Use to check blood sugar once a day 1 kit 0  . busPIRone (BUSPAR) 10 MG tablet TAKE 1 TABLET (10 MG TOTAL) BY MOUTH 2 TIMES DAILY. 180 tablet 1  . dapagliflozin propanediol (FARXIGA) 10 MG TABS tablet Take 10 mg by mouth daily. 30 tablet 3  . glipiZIDE (GLUCOTROL XL) 2.5 MG 24 hr tablet Take 1 tablet (2.5  mg total) by mouth daily with breakfast. 30 tablet 3  . glucose blood (BAYER CONTOUR NEXT TEST) test strip Use as instructed to check blood sugar twice a day dx code E11.65 100 each 3  . JANUMET XR [phone] MG TB24 TAKE 1 TABLET BY MOUTH DAILY 30 tablet 3  . pioglitazone (ACTOS) 30 MG tablet Take 1 tablet (30 mg total) by mouth daily. 30 tablet 3  . simvastatin (ZOCOR) 80 MG tablet TAKE 1 TABLET BY MOUTH DAILY. 30 tablet 3  . traZODone (DESYREL) 150 MG tablet TAKE 1/2 TABLET (75 MG TOTAL) BY MOUTH AT BEDTIME. 45 tablet 3  . canagliflozin (INVOKANA) 300 MG TABS tablet Take 1 tablet (300 mg total) by mouth daily before breakfast. 30 tablet 3  . esomeprazole (NEXIUM) 20 MG capsule Take 20 mg by mouth daily at 12 noon.    . RaNITidine HCl (ACID REDUCER PO) Take by mouth.    . dapagliflozin propanediol (FARXIGA) 10 MG TABS tablet Take 10 mg by mouth daily. 30 tablet 4   No facility-administered medications prior to visit.      Per HPI unless specifically indicated in ROS section below Review of Systems     Objective:    BP 114/74   Pulse 61   Temp 98.3 F (36.8 C) (Oral)   Wt 164  lb 4 oz (74.5 kg)   SpO2 95%   BMI 25.73 kg/m   Wt Readings from Last 3 Encounters:  12/18/16 164 lb 4 oz (74.5 kg)  10/03/16 163 lb 9.6 oz (74.2 kg)  07/02/16 175 lb (79.4 kg)    Physical Exam  Constitutional: He appears well-developed and well-nourished. No distress.  Skin: Skin is warm and dry. Rash noted. There is erythema.  Pruritic papular erythematous rash throughout trunk (chest, back) and extremities with excoriated erosions at different stages of healing  Nursing note and vitals reviewed.  Lab Results  Component Value Date   HGBA1C 7.0 (H) 10/01/2016       Assessment & Plan:   Problem List Items Addressed This Visit    Skin rash - Primary    Not consistent with poison ivy. Possible nummular eczema vs possible drug reaction - treat today with dexamethasone 74m IM (discussed hyperglycemia  precautions, advised minimizing carbs over next few days), topical clobetasol cream to affected areas, further precautions as per instructions. Update if no better, consider derm referral.           Follow up plan: Return if symptoms worsen or fail to improve.  JRia Bush MD

## 2016-12-18 NOTE — Patient Instructions (Signed)
Possible eczema, possible drug rash. Treat with steroid shot today, continue steroid cream as needed - refilled to pharmacy. Take benadryl 1/2 tablet as needed for itching. Look into oatmeal bath, use regular fragrance free moisturizer onto skin.  Let us know if not improving with treatment.   Eczema Eczema, also called atopic dermatitis, is a skin disorder that causes inflammation of the skin. It causes a red rash and dry, scaly skin. The skin becomes very itchy. Eczema is generally worse during the cooler winter months and often improves with the warmth of summer. Eczema usually starts showing signs in infancy. Some children outgrow eczema, but it may last through adulthood. What are the causes? The exact cause of eczema is not known, but it appears to run in families. People with eczema often have a family history of eczema, allergies, asthma, or hay fever. Eczema is not contagious. Flare-ups of the condition may be caused by:  Contact with something you are sensitive or allergic to.  Stress.  What are the signs or symptoms?  Dry, scaly skin.  Red, itchy rash.  Itchiness. This may occur before the skin rash and may be very intense. How is this diagnosed? The diagnosis of eczema is usually made based on symptoms and medical history. How is this treated? Eczema cannot be cured, but symptoms usually can be controlled with treatment and other strategies. A treatment plan might include:  Controlling the itching and scratching. ? Use over-the-counter antihistamines as directed for itching. This is especially useful at night when the itching tends to be worse. ? Use over-the-counter steroid creams as directed for itching. ? Avoid scratching. Scratching makes the rash and itching worse. It may also result in a skin infection (impetigo) due to a break in the skin caused by scratching.  Keeping the skin well moisturized with creams every day. This will seal in moisture and help prevent  dryness. Lotions that contain alcohol and water should be avoided because they can dry the skin.  Limiting exposure to things that you are sensitive or allergic to (allergens).  Recognizing situations that cause stress.  Developing a plan to manage stress.  Follow these instructions at home:  Only take over-the-counter or prescription medicines as directed by your health care provider.  Do not use anything on the skin without checking with your health care provider.  Keep baths or showers short (5 minutes) in warm (not hot) water. Use mild cleansers for bathing. These should be unscented. You may add nonperfumed bath oil to the bath water. It is best to avoid soap and bubble bath.  Immediately after a bath or shower, when the skin is still damp, apply a moisturizing ointment to the entire body. This ointment should be a petroleum ointment. This will seal in moisture and help prevent dryness. The thicker the ointment, the better. These should be unscented.  Keep fingernails cut short. Children with eczema may need to wear soft gloves or mittens at night after applying an ointment.  Dress in clothes made of cotton or cotton blends. Dress lightly, because heat increases itching.  A child with eczema should stay away from anyone with fever blisters or cold sores. The virus that causes fever blisters (herpes simplex) can cause a serious skin infection in children with eczema. Contact a health care provider if:  Your itching interferes with sleep.  Your rash gets worse or is not better within 1 week after starting treatment.  You see pus or soft yellow scabs in the rash  area.  You have a fever.  You have a rash flare-up after contact with someone who has fever blisters. This information is not intended to replace advice given to you by your health care provider. Make sure you discuss any questions you have with your health care provider. Document Released: 04/18/2000 Document Revised:  09/27/2015 Document Reviewed: 11/22/2012 Elsevier Interactive Patient Education  2017 [employer].

## 2016-12-18 NOTE — Assessment & Plan Note (Signed)
Not consistent with poison ivy. Possible nummular eczema vs possible drug reaction - treat today with dexamethasone 10mg  IM (discussed hyperglycemia precautions, advised minimizing carbs over next few days), topical clobetasol cream to affected areas, further precautions as per instructions. Update if no better, consider derm referral.

## 2016-12-18 NOTE — Addendum Note (Signed)
Addended by: Desmond DikeKNIGHT, Samya Siciliano H on: 12/18/2016 09:42 AM   Modules accepted: Orders

## 2016-12-23 ENCOUNTER — Other Ambulatory Visit: Payer: Self-pay | Admitting: Endocrinology

## 2016-12-24 ENCOUNTER — Telehealth: Payer: Self-pay | Admitting: Endocrinology

## 2016-12-24 ENCOUNTER — Other Ambulatory Visit: Payer: Self-pay

## 2016-12-24 MED ORDER — ACCU-CHEK GUIDE W/DEVICE KIT: 1.0000 | PACK | Freq: Two times a day (BID) | 2 refills | Status: DC

## 2016-12-24 MED ORDER — GLUCOSE BLOOD VI STRP: ORAL_STRIP | 3 refills | Status: DC

## 2016-12-24 NOTE — Telephone Encounter (Signed)
That is okay, to check blood sugars twice a day

## 2016-12-24 NOTE — Telephone Encounter (Signed)
Please advise if okay to send Accu-Chek Guide instead due to insurance.

## 2016-12-24 NOTE — Telephone Encounter (Signed)
Blood Glucose Monitoring Suppl (BAYER CONTOUR NEXT MONITOR) w/Device KIT is not covered by the insurance any more due to changing insurance. Patient's insurance only covers the [place]. Call pharmacy to advise.

## 2016-12-24 NOTE — Telephone Encounter (Signed)
[facility] calling in reference to get update on Accu-check guide. I informed them of note below.

## 2016-12-24 NOTE — Telephone Encounter (Signed)
Called pharmacy and talked to Rosey Bath and also a male pharmacist and let them know that I have sent in a new prescription for the Accu-Chek Guide blood glucose meter and test strips.

## 2016-12-29 ENCOUNTER — Other Ambulatory Visit: Payer: Self-pay

## 2016-12-29 ENCOUNTER — Telehealth: Payer: Self-pay | Admitting: Endocrinology

## 2016-12-29 MED ORDER — CANAGLIFLOZIN 300 MG PO TABS: 300.0000 mg | ORAL_TABLET | Freq: Every day | ORAL | 3 refills | Status: DC

## 2016-12-29 NOTE — Telephone Encounter (Signed)
Please send prescription for 300 mg Invokana daily and he will continue Janumet

## 2016-12-29 NOTE — Telephone Encounter (Signed)
We need to have him try get Jardiance authorized since Invokamet is not covered.  He can stop the [place]

## 2016-12-29 NOTE — Telephone Encounter (Signed)
Called patient and he stated that he has had the rash since he has been on the [place]. He has been to urgent care and his PCP. They say it is from medication. Patient stated the rash is all over his back, his chest, his legs and extremely itchy. He has been taking Benadryl at night to help him sleep. He stated that he did not have this when he was on the Invokamet. Patient states he uses [facility]. His labs are this week and his appt with [place] is on Wed. 01/07/2017. Please advise.

## 2016-12-29 NOTE — Telephone Encounter (Signed)
Patient would like to speak to nurse about rash that has not gone away since starting janumet. Patient would like to know what to do. Please advise.

## 2016-12-29 NOTE — Telephone Encounter (Signed)
Called patient and let him know that I am sending in the [place] Rx. He stated that he also had the severe rash when taking the Jardiance and the Janumet. Patient stated that he is currently taking the 2.5 glipizide and the Actos 30 mg. He asked if he is supposed to be taking the 5 mg glipizide? Please advise.

## 2016-12-29 NOTE — Telephone Encounter (Signed)
Patient stated the Andrew Hart is covered. Please advise.

## 2016-12-29 NOTE — Telephone Encounter (Signed)
Routing to you °

## 2016-12-29 NOTE — Telephone Encounter (Signed)
His glipizide was reduced to 2.5 mg but he was also taking Janumet on his last visit without any rash, if he is going done Stop Janumet he will need to take Januvia separately and changing the Invokana to Invokamet XR.  I prefer that he stay on Janumet XR

## 2016-12-30 NOTE — Telephone Encounter (Signed)
Please remind him that he has to take his blood sugars regularly and bring his monitor on his visit.  Since he is not taking Janumet he will have to take 2 tablets of the 2.5 mg glipizide ER or 5 mg

## 2016-12-30 NOTE — Telephone Encounter (Signed)
Called patient and let him know to take the 2.5 mg glipizide. He stated that he has had a rash since he started the Janumet for the last 3 months or so and he is not able to take this. The pharmacy called to let him know they are going to send in a PA for the Invokana.

## 2016-12-31 NOTE — Telephone Encounter (Signed)
Called patient and he stated that he has some of the 5 mg glipizide and will take this for now. He also stated that his insurance required him to get a new meter and he recently received a new meter and I let him know to bring both of his meters to his appointment so we can check his blood sugars for the past month.

## 2017-01-01 ENCOUNTER — Other Ambulatory Visit (INDEPENDENT_AMBULATORY_CARE_PROVIDER_SITE_OTHER): Payer: Medicaid Other

## 2017-01-01 DIAGNOSIS — E1165 Type 2 diabetes mellitus with hyperglycemia: Secondary | ICD-10-CM

## 2017-01-01 LAB — COMPREHENSIVE METABOLIC PANEL
ALT: 27 U/L (ref 0–53)
AST: 17 U/L (ref 0–37)
Albumin: 4 g/dL (ref 3.5–5.2)
Alkaline Phosphatase: 84 U/L (ref 39–117)
BILIRUBIN TOTAL: 0.5 mg/dL (ref 0.2–1.2)
BUN: 17 mg/dL (ref 6–23)
CO2: 26 mEq/L (ref 19–32)
Calcium: 8.8 mg/dL (ref 8.4–10.5)
Chloride: 107 mEq/L (ref 96–112)
Creatinine, Ser: 0.82 mg/dL (ref 0.40–1.50)
GFR: 107.08 mL/min (ref >60.00)
Glucose, Bld: 200 mg/dL — ABNORMAL HIGH (ref 70–99)
POTASSIUM: 3.9 meq/L (ref 3.5–5.1)
Sodium: 139 mEq/L (ref 135–145)
TOTAL PROTEIN: 6.3 g/dL (ref 6.0–8.3)

## 2017-01-01 LAB — HEMOGLOBIN A1C: Hgb A1c MFr Bld: 7.8 % — ABNORMAL HIGH (ref 4.6–6.5)

## 2017-01-07 ENCOUNTER — Ambulatory Visit (INDEPENDENT_AMBULATORY_CARE_PROVIDER_SITE_OTHER): Payer: Medicaid Other | Admitting: Endocrinology

## 2017-01-07 ENCOUNTER — Encounter: Payer: Self-pay | Admitting: Endocrinology

## 2017-01-07 VITALS — BP 124/84 | HR 62 | Ht 67.0 in | Wt 164.6 lb

## 2017-01-07 DIAGNOSIS — E1165 Type 2 diabetes mellitus with hyperglycemia: Secondary | ICD-10-CM

## 2017-01-07 MED ORDER — SITAGLIPTIN PHOSPHATE 100 MG PO TABS: 100.0000 mg | ORAL_TABLET | Freq: Every day | ORAL | 3 refills | Status: DC

## 2017-01-07 NOTE — Progress Notes (Signed)
Patient ID: Andrew Hart, male   DOB: August 21, 1970, 46 y.o.   MRN: [id number]           Reason for Appointment: Follow-up for Type 2 Diabetes   History of Present Illness:          Date of diagnosis of type 2 diabetes mellitus: 2007 ?        Background history:  He was diagnosed with routine screening lab work He was initially tried on metformin but this caused diarrhea and he could not continue this Subsequently was treated with glipizide and apparently this was able to control his blood sugars fairly well for a few years Probably in early 2016 he was given Actos in addition to help his glucose control as A1c had gone up to 9.4 This helped transiently and A1c was 7.7 However his A1c had been otherwise over 8% since mid 2015 Because of  high blood sugars on his initial consultation in 05/2014 (A1c was 9.1) he was started on Invokana and Onglyza in addition to his Actos and glipizide.   Recent history:  Non-insulin hypoglycemic drugs the patient is taking are: Glipizide ER 5 mg daily, Actos 30 mg daily  Previously on Janumet XR 100/1000 daily  Previously was able to get Jardiance since his visit in 06/2016 but with his insurance change he was not able to get this this summer  His A1c is now significantly higher at 7.8, previously was well controlled and back to 7  Current blood sugar patterns and problems identified:  Because of having a chronic rash he was recommended stopping his Janumet XR by PCP  This he did a couple of months ago  Also since he has not been able to take Jardiance and also recently not getting Invokana because of insurance difficulties his blood sugars have been progressively higher  Glucose readings in July ranged from 123-169 but more recently they're mostly around 200  Has not checked blood sugars very much, using Accu-Chek now  He is active at work but not in formal exercise  He thinks he is avoiding high-fat foods but does eat pizza regularly    Side effects from medications have been: Diarrhea from regular metformin  Compliance with the medical regimen: Fair Hypoglycemia: None   Glucose monitoring:  done 0-1  times a d ay         Glucometer:  Accu-Chek    Blood Glucose readings recent average 209 Recent Range 69-269 with only 6 readings  Self-care: The diet that the patient has been following is: tries to limit sweets      Typical meal intake: Breakfast is a breakfast bar on weekdays, eggs, bacon and grits on weekends Lunch = sandwich, soup Dinner =  chicken and pasta.  Snacks will be nuts and cheese                Dietician visit, most recent: 06/2015               Exercise: at work, lawnmowing   Weight history:    Wt Readings from Last 3 Encounters:  01/07/17 164 lb 9.6 oz (74.7 kg)  12/18/16 164 lb 4 oz (74.5 kg)  10/03/16 163 lb 9.6 oz (74.2 kg)    Glycemic control:    Lab Results  Component Value Date   HGBA1C 7.8 (H) 01/01/2017   HGBA1C 7.0 (H) 10/01/2016   HGBA1C 8.3 (H) 06/26/2016   Lab Results  Component Value Date   MICROALBUR <0.7 04/01/2016  [place] 92 06/26/2016   CREATININE 0.82 01/01/2017    Lab on 01/01/2017  Component Date Value Ref Range Status  . Hgb A1c MFr Bld 01/01/2017 7.8* 4.6 - 6.5 % Final   Glycemic Control Guidelines for People with Diabetes:Non Diabetic:  <6%Goal of Therapy: <7%Additional Action Suggested:  >8%   . Sodium 01/01/2017 139  135 - 145 mEq/L Final  . Potassium 01/01/2017 3.9  3.5 - 5.1 mEq/L Final  . Chloride 01/01/2017 107  96 - 112 mEq/L Final  . CO2 01/01/2017 26  19 - 32 mEq/L Final  . Glucose, Bld 01/01/2017 200* 70 - 99 mg/dL Final  . BUN 01/01/2017 17  6 - 23 mg/dL Final  . Creatinine, Ser 01/01/2017 0.82  0.40 - 1.50 mg/dL Final  . Total Bilirubin 01/01/2017 0.5  0.2 - 1.2 mg/dL Final  . Alkaline Phosphatase 01/01/2017 84  39 - 117 U/L Final  . AST 01/01/2017 17  0 - 37 U/L Final  . ALT 01/01/2017 27  0 - 53 U/L Final  . Total Protein 01/01/2017 6.3   6.0 - 8.3 g/dL Final  . Albumin 01/01/2017 4.0  3.5 - 5.2 g/dL Final  . Calcium 01/01/2017 8.8  8.4 - 10.5 mg/dL Final  . GFR 01/01/2017 107.08  >60.00 mL/min Final      Allergies as of 01/07/2017      Reactions   Janumet Xr [sitagliptin-metformin Hcl Er] Rash   Jardiance [empagliflozin] Rash      Medication List       Accurate as of 01/07/17 10:29 AM. Always use your most recent med list.          ACCU-CHEK GUIDE w/Device Kit 1 kit by Does not apply route 2 (two) times daily. Use to check blood sugar twice a day   acyclovir 200 MG capsule Commonly known as:  ZOVIRAX Take 1 capsule (200 mg total) by mouth 3 (three) times daily. For 3-5 days as needed   aspirin 81 MG tablet Take 81 mg by mouth daily.   BAYER MICROLET LANCETS lancets Use as instructed to check blood sugar twice a day dx E11.65   busPIRone 10 MG tablet Commonly known as:  BUSPAR TAKE 1 TABLET (10 MG TOTAL) BY MOUTH 2 TIMES DAILY.   canagliflozin 300 MG Tabs tablet Commonly known as:  INVOKANA Take 1 tablet (300 mg total) by mouth daily before breakfast.   clobetasol cream 0.05 % Commonly known as:  TEMOVATE Apply 1 application topically 2 (two) times daily. Apply to AA. No more than 2 weeks at a time   dapagliflozin propanediol 10 MG Tabs tablet Commonly known as:  FARXIGA Take 10 mg by mouth daily.   glipiZIDE 2.5 MG 24 hr tablet Commonly known as:  GLUCOTROL XL Take 1 tablet (2.5 mg total) by mouth daily with breakfast.   glucose blood test strip Commonly known as:  ACCU-CHEK GUIDE Use to check blood sugars twice daily   JANUMET XR [phone] MG Tb24 Generic drug:  SitaGLIPtin-MetFORMIN HCl TAKE 1 TABLET BY MOUTH DAILY   pioglitazone 30 MG tablet Commonly known as:  ACTOS Take 1 tablet (30 mg total) by mouth daily.   simvastatin 80 MG tablet Commonly known as:  ZOCOR TAKE 1 TABLET BY MOUTH DAILY.   sitaGLIPtin 100 MG tablet Commonly known as:  JANUVIA Take 1 tablet (100 mg total) by  mouth daily.   traZODone 150 MG tablet Commonly known as:  DESYREL TAKE 1/2 TABLET (75 MG TOTAL) BY MOUTH AT  BEDTIME.            Discharge Care Instructions        Start     Ordered   01/07/17 0000  sitaGLIPtin (JANUVIA) 100 MG tablet  Daily     01/07/17 1029      Allergies:  Allergies  Allergen Reactions  . Janumet Xr [Sitagliptin-Metformin Hcl Er] Rash  . Jardiance [Empagliflozin] Rash    Past Medical History:  Diagnosis Date  . Alcohol use 11/08/2015  . Diabetes mellitus without complication ([place]) 6144   Chioma Mukherjee  . GERD (gastroesophageal reflux disease)   . Herpes labialis   . History of chicken pox   . Hyperlipidemia     Past Surgical History:  Procedure Laterality Date  . APPENDECTOMY    . FINGER SURGERY Right    2 screws in ring finger  . TONSILLECTOMY      Family History  Problem Relation Age of Onset  . Diabetes Mother   . CAD Father 46       MI at 84, CABG at 69  . Diabetes Maternal Grandmother   . Parkinsonism Mother 74       progressive supranuclear palsy, deceased  . Stroke Neg Hx   . Cancer Neg Hx     Social History:  reports that he has been smoking Cigarettes.  He has a 7.50 pack-year smoking history. He has never used smokeless tobacco. He reports that he drinks alcohol. He reports that he uses drugs.    Review of Systems    Lipid history:  Has been on 80 mg simvastatin for treatment, prescribed by PCP  His lipids are well controlled    Lab Results  Component Value Date   CHOL 158 06/26/2016   HDL 43.50 06/26/2016   LDLCALC 92 06/26/2016   TRIG 115.0 06/26/2016   CHOLHDL 4 06/26/2016           Most recent eye exam was in 04/2015  Most recent foot exam: 5/18  Review of Systems  No history of hypertension  Physical Examination:  BP 124/84   Pulse 62   Ht '5\' 7"'$  (1.702 m)   Wt 164 lb 9.6 oz (74.7 kg)   SpO2 95%   BMI 25.78 kg/m    ASSESSMENT:  Diabetes type 2, BMI 28   See history of present illness for  detailed discussion of current diabetes management, blood sugar patterns and problems identified  He was previously well controlled on a multidrug regimen of Jardiance in addition to Janumet XR, glipizide ER 5 mg and Actos He has not been able to get Jardiance or Invokana because of insurance issues Currently off Janumet XR because of possible relationship to rash although he had been taking it since about February of this year without previous problems  A1c is higher at about 7.8 and his glucose readings are around 200 both at home and in the lab Although he has maintained his weight he can do a little better with diet   PLAN:     Start checking blood sugars more regularly  Cut back on high-fat foods  Start taking Januvia 100 mg daily  If his rash has resolved with possibly try him on Janumet again  Hopefully can start Invokana if approved  Once blood sugars are improving he can go back to 2.5 mg glipizide ER   There are no Patient Instructions on file for this visit.   Marques Ericson 01/07/2017, 10:29 AM   Note: This office note was  prepared with Estate agent. Any transcriptional errors that result from this process are unintentional.

## 2017-01-09 ENCOUNTER — Other Ambulatory Visit: Payer: Self-pay

## 2017-01-09 MED ORDER — SITAGLIPTIN PHOSPHATE 100 MG PO TABS: 100.0000 mg | ORAL_TABLET | Freq: Every day | ORAL | 3 refills | Status: DC

## 2017-01-13 ENCOUNTER — Telehealth: Payer: Self-pay | Admitting: Endocrinology

## 2017-01-13 NOTE — Telephone Encounter (Signed)
Spoke with patient about Januvia being approved through insurance. [facility]. Up to 72 hours for review.

## 2017-01-13 NOTE — Telephone Encounter (Signed)
PA for Invokana 300 mg and initiated. PA # for Theodis Satonvokana [id number] Waiting for Approval  PA for Januvia 100 mg was initiated and approved through [place] tracks

## 2017-01-13 NOTE — Telephone Encounter (Signed)
Calling to check on status of PA for Invokana and Januvia.  Can go to web or call for PA.  [email or web address]  [phone]  Please advise,  Ty,  -LL

## 2017-01-14 NOTE — Telephone Encounter (Signed)
Called  Tracks regarding PA for Invokanna and spoke to Annabell, she said it was denied due to pt has to try preferred medications first. Told her pt has tried other medications but  is highly allergic can only take Invokanna. Annabell verbalized understanding and said I can submit another request online or do now. Told her I would like to do it now. [facility], information given. Annabell verbalized understanding and said it will need to go through clinical pharmacy again. Told her okay. Prior Auth # [id number]

## 2017-01-15 ENCOUNTER — Other Ambulatory Visit: Payer: Self-pay | Admitting: Family Medicine

## 2017-01-15 ENCOUNTER — Other Ambulatory Visit: Payer: Self-pay | Admitting: Endocrinology

## 2017-01-20 ENCOUNTER — Telehealth: Payer: Self-pay

## 2017-01-20 DIAGNOSIS — R21 Rash and other nonspecific skin eruption: Secondary | ICD-10-CM

## 2017-01-20 NOTE — Telephone Encounter (Signed)
I'd like to refer him to dermatology for further evaluation of abnormal rash - referral placed.

## 2017-01-20 NOTE — Telephone Encounter (Signed)
Pt left v/m; pt seen 12/18/16 and rash is no better and probably a little worse. Pt hoped after shot rash would go away but not the case. [facility]. Per 12/18/16 office note would consider dermatology referral. Pt request cb.

## 2017-01-26 NOTE — Telephone Encounter (Signed)
Andrew Hart handled this ass the patient has [place] and must go to his PCP on the card to be referred to a Derm.

## 2017-02-09 NOTE — Telephone Encounter (Signed)
Spoke with Andrew Hart at [facility] tracks and she stated the patient has to try and fail varxiga, jardiance, and a metformin containing medication- please advise

## 2017-02-09 NOTE — Telephone Encounter (Signed)
Please send prescription for Jardiance 25 mg daily

## 2017-02-10 ENCOUNTER — Other Ambulatory Visit: Payer: Self-pay

## 2017-02-10 MED ORDER — EMPAGLIFLOZIN 25 MG PO TABS: 25.0000 mg | ORAL_TABLET | Freq: Every day | ORAL | 3 refills | Status: DC

## 2017-02-10 NOTE — Telephone Encounter (Signed)
He can get Invokana from [employer]

## 2017-02-10 NOTE — Telephone Encounter (Signed)
Sent the prescription and informed the patient- the patient stated that he has lost his insurance and can not afford his medication he would like to know what patient assistance he can apply for that will help him with this please advise

## 2017-02-10 NOTE — Telephone Encounter (Signed)
Left patient a vm making him aware of the patient assistance through [place] and requested he apply through their on-line application process and let us know if he gets approved

## 2017-02-19 ENCOUNTER — Telehealth: Payer: Self-pay | Admitting: Endocrinology

## 2017-02-19 NOTE — Telephone Encounter (Signed)
Couldn't get to web site fill out form to get the invokana, please call

## 2017-02-20 NOTE — Telephone Encounter (Signed)
Called patient and left a voice message to see what site patient is going to for his Invokana. Hopefully patient will call back with more details so I can assist more.

## 2017-02-20 NOTE — Telephone Encounter (Signed)
He did go to the [employer] website but could not find the form could we fax it to him [phone]

## 2017-02-20 NOTE — Telephone Encounter (Signed)
Called patient and let him know that I have faxed over the assistance form to the fax to the number he provided.

## 2017-02-24 ENCOUNTER — Telehealth: Payer: Self-pay | Admitting: Endocrinology

## 2017-02-24 NOTE — Telephone Encounter (Signed)
Please call patient in re: status of paperwork patient dropped off. Are you faxing paperwork to [facility] or is he supposed to pick them up? Please advise ph# [phone]

## 2017-02-24 NOTE — Telephone Encounter (Signed)
Called patient and he stated that he dropped of the [employer] Assistance form for Dr. Lucianne MussKumar to fill out. I will place on Dr. Emelda BrothersKumars desk for him to complete and fax this paperwork and call Arlys JohnBrian to let him know that this has been faxed.

## 2017-02-25 LAB — HM DIABETES EYE EXAM

## 2017-03-06 ENCOUNTER — Other Ambulatory Visit (INDEPENDENT_AMBULATORY_CARE_PROVIDER_SITE_OTHER): Payer: BLUE CROSS/BLUE SHIELD

## 2017-03-06 DIAGNOSIS — E1165 Type 2 diabetes mellitus with hyperglycemia: Secondary | ICD-10-CM

## 2017-03-06 LAB — HEMOGLOBIN A1C: HEMOGLOBIN A1C: 10.1 % — AB (ref 4.6–6.5)

## 2017-03-06 LAB — MICROALBUMIN / CREATININE URINE RATIO
CREATININE, U: 139.8 mg/dL
MICROALB/CREAT RATIO: 0.8 mg/g (ref 0.0–30.0)
Microalb, Ur: 1.1 mg/dL (ref 0.0–1.9)

## 2017-03-06 LAB — BASIC METABOLIC PANEL
BUN: 12 mg/dL (ref 6–23)
CALCIUM: 9 mg/dL (ref 8.4–10.5)
CO2: 28 meq/L (ref 19–32)
Chloride: 102 mEq/L (ref 96–112)
Creatinine, Ser: 0.86 mg/dL (ref 0.40–1.50)
GFR: 101.27 mL/min (ref >60.00)
Glucose, Bld: 318 mg/dL — ABNORMAL HIGH (ref 70–99)
POTASSIUM: 4.2 meq/L (ref 3.5–5.1)
SODIUM: 135 meq/L (ref 135–145)

## 2017-03-09 ENCOUNTER — Telehealth: Payer: Self-pay

## 2017-03-09 ENCOUNTER — Encounter: Payer: Self-pay | Admitting: Endocrinology

## 2017-03-09 ENCOUNTER — Ambulatory Visit (INDEPENDENT_AMBULATORY_CARE_PROVIDER_SITE_OTHER): Payer: BLUE CROSS/BLUE SHIELD | Admitting: Endocrinology

## 2017-03-09 VITALS — BP 126/80 | HR 73 | Ht 67.0 in | Wt 162.2 lb

## 2017-03-09 DIAGNOSIS — E1165 Type 2 diabetes mellitus with hyperglycemia: Secondary | ICD-10-CM

## 2017-03-09 MED ORDER — GLUCOSE BLOOD VI STRP: ORAL_STRIP | 3 refills | Status: DC

## 2017-03-09 MED ORDER — CANAGLIFLOZIN 300 MG PO TABS: 300.0000 mg | ORAL_TABLET | Freq: Every day | ORAL | 3 refills | Status: DC

## 2017-03-09 MED ORDER — GLIPIZIDE ER 10 MG PO TB24: 10.0000 mg | ORAL_TABLET | Freq: Every day | ORAL | 3 refills | Status: DC

## 2017-03-09 NOTE — Telephone Encounter (Signed)
Called patient and left a voice message to let him know the message and that he can check with his insurance to see what is covered and to call us back to let us know. We can also get him copay cards for [place] and Jardiance.

## 2017-03-09 NOTE — Patient Instructions (Addendum)
Reduce milk  Check blood sugars on waking up  3/7  Also check blood sugars about 2 hours after a meal and do this after different meals by rotation  Recommended blood sugar levels on waking up is 90-130 and about 2 hours after meal is 130-160  Please bring your blood sugar monitor to each visit, thank you

## 2017-03-09 NOTE — Telephone Encounter (Signed)
Patient stated that he need a prescription for farxiga and jardiance to see if insurance will cover it. Please advise

## 2017-03-09 NOTE — Telephone Encounter (Signed)
Please advise for [place] and/or Jardiance. Please see message.

## 2017-03-09 NOTE — Telephone Encounter (Signed)
Spoke with the patient today and he stated that he no longer needs the PA for Januvia and he no longer has medicade

## 2017-03-09 NOTE — Telephone Encounter (Signed)
Patient asked you to fax the invokana information to [name] to the assistance program, he stated you had all the all the fax information

## 2017-03-09 NOTE — Telephone Encounter (Signed)
He will need to check on Farxiga and Jardiance, the co-pay card for Andrew DeistFarxiga will be able to save him at least $300

## 2017-03-09 NOTE — Telephone Encounter (Signed)
Most likely he has a high deductible and not an issue with insurance coverage.  He will need to take a printed prescription for Farxiga 10 mg daily along with the co-pay card.  However he needs to talk to his insurance about what the problem is first (epic indicates Theodis Satonvokana is covered)

## 2017-03-09 NOTE — Telephone Encounter (Signed)
Called patient because he called us and was not able to be transferred to me and he stated that they are charging him $250 for the Invokana and this is with the coverage of the copay card. He is wanting to know if there is something else that would be more affordable. I advised for him to contact his insurance to see what is covered. Please advise.

## 2017-03-09 NOTE — Progress Notes (Signed)
Patient ID: Andrew Hart, male   DOB: 1970/12/07, 46 y.o.   MRN: [id number]           Reason for Appointment: Follow-up for Type 2 Diabetes   History of Present Illness:          Date of diagnosis of type 2 diabetes mellitus: 2007 ?        Background history:  He was diagnosed with routine screening lab work He was initially tried on metformin but this caused diarrhea and he could not continue this Subsequently was treated with glipizide and apparently this was able to control his blood sugars fairly well for a few years Probably in early 2016 he was given Actos in addition to help his glucose control as A1c had gone up to 9.4 This helped transiently and A1c was 7.7 However his A1c had been otherwise over 8% since mid 2015 Because of  high blood sugars on his initial consultation in 05/2014 (A1c was 9.1) he was started on Invokana and Onglyza in addition to his Actos and glipizide.   Recent history:  Non-insulin hypoglycemic drugs the patient is taking are: None, was on Glipizide ER 5 mg daily, Actos 30 mg daily  Previously on Januvia  His A1c is now significantly higher at 10.1, previously was well controlled and back to 7  Current blood sugar patterns and problems identified:  Because of having a chronic rash he has not been able to take his full medication regimen as she was uncertain about possible side effects from the medication  However several of his medications previously had not caused any rash and now with no medications except glipizide and Actos over the last 3-4 weeks his rashes are still present and probably worse  Blood sugars are markedly increased from used medication regimen and he has not taken any medications.  Last week  Glucose readings at home recent range 242-313 but checking mostly in the morning  Nonfasting 359   He has been trying to avoid drinks which sugar with his being more thirsty with high readings but drinking more milk  He has not been able  to be as active because of fatigue recently also        Side effects from medications have been: Diarrhea from regular metformin  Compliance with the medical regimen: Fair Hypoglycemia: None   Glucose monitoring:  done 0-1  times a d ay         Glucometer:  Accu-Chek    Blood Glucose readings recent average 271   Recent Range 69-269 with only 6 readings  Self-care: The diet that the patient has been following is: tries to limit sweets      Typical meal intake: Breakfast is a breakfast bar on weekdays, eggs, bacon and grits on weekends Lunch = sandwich, soup Dinner =  chicken and pasta.  Snacks will be nuts and cheese                Dietician visit, most recent: 06/2015               Exercise: at work  Weight history:    Wt Readings from Last 3 Encounters:  03/09/17 162 lb 3.2 oz (73.6 kg)  01/07/17 164 lb 9.6 oz (74.7 kg)  12/18/16 164 lb 4 oz (74.5 kg)    Glycemic control:    Lab Results  Component Value Date   HGBA1C 10.1 (H) 03/06/2017   HGBA1C 7.8 (H) 01/01/2017   HGBA1C 7.0 (H)  10/01/2016   Lab Results  Component Value Date   MICROALBUR 1.1 03/06/2017   LDLCALC 92 06/26/2016   CREATININE 0.86 03/06/2017    Lab on 03/06/2017  Component Date Value Ref Range Status  . Hgb A1c MFr Bld 03/06/2017 10.1* 4.6 - 6.5 % Final   Glycemic Control Guidelines for People with Diabetes:Non Diabetic:  <6%Goal of Therapy: <7%Additional Action Suggested:  >8%   . Sodium 03/06/2017 135  135 - 145 mEq/L Final  . Potassium 03/06/2017 4.2  3.5 - 5.1 mEq/L Final  . Chloride 03/06/2017 102  96 - 112 mEq/L Final  . CO2 03/06/2017 28  19 - 32 mEq/L Final  . Glucose, Bld 03/06/2017 318* 70 - 99 mg/dL Final  . BUN 03/06/2017 12  6 - 23 mg/dL Final  . Creatinine, Ser 03/06/2017 0.86  0.40 - 1.50 mg/dL Final  . Calcium 03/06/2017 9.0  8.4 - 10.5 mg/dL Final  . GFR 03/06/2017 101.27  >60.00 mL/min Final  . Microalb, Ur 03/06/2017 1.1  0.0 - 1.9 mg/dL Final  . Creatinine,U  03/06/2017 139.8  mg/dL Final  . Microalb Creat Ratio 03/06/2017 0.8  0.0 - 30.0 mg/g Final      Allergies as of 03/09/2017      Reactions   Janumet Xr [sitagliptin-metformin Hcl Er] Rash   Jardiance [empagliflozin] Rash   Metformin And Related Diarrhea      Medication List        Accurate as of 03/09/17 10:48 AM. Always use your most recent med list.          ACCU-CHEK GUIDE w/Device Kit 1 kit by Does not apply route 2 (two) times daily. Use to check blood sugar twice a day   acyclovir 200 MG capsule Commonly known as:  ZOVIRAX Take 1 capsule (200 mg total) by mouth 3 (three) times daily. For 3-5 days as needed   aspirin 81 MG tablet Take 81 mg by mouth daily.   BAYER MICROLET LANCETS lancets Use as instructed to check blood sugar twice a day dx E11.65   busPIRone 10 MG tablet Commonly known as:  BUSPAR TAKE 1 TABLET (10 MG TOTAL) BY MOUTH 2 TIMES DAILY.   canagliflozin 300 MG Tabs tablet Commonly known as:  INVOKANA Take 1 tablet (300 mg total) daily before breakfast by mouth.   clobetasol cream 0.05 % Commonly known as:  TEMOVATE Apply 1 application topically 2 (two) times daily. Apply to AA. No more than 2 weeks at a time   empagliflozin 25 MG Tabs tablet Commonly known as:  JARDIANCE Take 25 mg by mouth daily.   glipiZIDE 10 MG 24 hr tablet Commonly known as:  GLUCOTROL XL Take 1 tablet (10 mg total) daily with breakfast by mouth.   glucose blood test strip Commonly known as:  ACCU-CHEK GUIDE Use to check blood sugars twice daily   JANUMET XR [phone] MG Tb24 Generic drug:  SitaGLIPtin-MetFORMIN HCl TAKE 1 TABLET BY MOUTH DAILY   pioglitazone 30 MG tablet Commonly known as:  ACTOS Take 1 tablet (30 mg total) by mouth daily.   simvastatin 80 MG tablet Commonly known as:  ZOCOR TAKE 1 TABLET BY MOUTH DAILY.   sitaGLIPtin 100 MG tablet Commonly known as:  JANUVIA Take 1 tablet (100 mg total) by mouth daily.   traZODone 150 MG tablet Commonly  known as:  DESYREL TAKE 1/2 TABLET (75 MG TOTAL) BY MOUTH AT BEDTIME.       Allergies:  Allergies  Allergen Reactions  .  Janumet Xr [Sitagliptin-Metformin Hcl Er] Rash  . Jardiance [Empagliflozin] Rash  . Metformin And Related Diarrhea    Past Medical History:  Diagnosis Date  . Alcohol use 11/08/2015  . Diabetes mellitus without complication ([place]) 3734   Eldene Plocher  . GERD (gastroesophageal reflux disease)   . Herpes labialis   . History of chicken pox   . Hyperlipidemia     Past Surgical History:  Procedure Laterality Date  . APPENDECTOMY    . FINGER SURGERY Right    2 screws in ring finger  . TONSILLECTOMY      Family History  Problem Relation Age of Onset  . Diabetes Mother   . CAD Father 71       MI at 80, CABG at 39  . Diabetes Maternal Grandmother   . Parkinsonism Mother 21       progressive supranuclear palsy, deceased  . Stroke Neg Hx   . Cancer Neg Hx     Social History:  reports that he has been smoking cigarettes.  He has a 7.50 pack-year smoking history. he has never used smokeless tobacco. He reports that he drinks alcohol. He reports that he uses drugs.    Review of Systems    Lipid history:  Has been on 80 mg simvastatin for treatment, prescribed by PCP  His lipids are well controlled    Lab Results  Component Value Date   CHOL 158 06/26/2016   HDL 43.50 06/26/2016   LDLCALC 92 06/26/2016   TRIG 115.0 06/26/2016   CHOLHDL 4 06/26/2016            Most recent foot exam: 5/18  Review of Systems     Physical Examination:  BP 126/80   Pulse 73   Ht '5\' 7"'  (1.702 m)   Wt 162 lb 3.2 oz (73.6 kg)   SpO2 95%   BMI 25.40 kg/m    ASSESSMENT:  Diabetes type 2, BMI 28   See history of present illness for detailed discussion of current diabetes management, blood sugar patterns and problems identified  He was previously well controlled on a multidrug regimen of Jardiance in addition to Janumet XR, glipizide ER 5 mg and Actos He has not  been able to get medications from various insurance plans, now should be able to However his blood sugars are significantly high and not clear if he will be able to control his glucose with oral medication He did significantly benefit from [place] in the past and should be able to get this covered   PLAN:     Start checking blood sugars more regularly especially after meals  He will need to cut back on drinking milk  Start taking Invokana along with glipizide ER, will use a higher doses for now both of them  Also if his blood sugar do come down significantly may need to reduce glipizide dose, he is aware of this  Discussed that if he needs to be on steroids for his rash even need to be on insulin also and he will let us know after his visit with a dermatologist  Discussed A1c targets   Patient Instructions  Reduce milk  Check blood sugars on waking up  3/7  Also check blood sugars about 2 hours after a meal and do this after different meals by rotation  Recommended blood sugar levels on waking up is 90-130 and about 2 hours after meal is 130-160  Please bring your blood sugar monitor to each visit, thank you  Dniyah Grant 03/09/2017, 10:48 AM   Note: This office note was prepared with Dragon voice recognition system technology. Any transcriptional errors that result from this process are unintentional.

## 2017-03-10 ENCOUNTER — Other Ambulatory Visit: Payer: Self-pay

## 2017-03-10 MED ORDER — DAPAGLIFLOZIN PROPANEDIOL 10 MG PO TABS
10.0000 mg | ORAL_TABLET | Freq: Every day | ORAL | 3 refills | Status: DC
Start: 2017-03-10 — End: 2017-05-28

## 2017-03-10 MED ORDER — EMPAGLIFLOZIN 25 MG PO TABS: 25.0000 mg | ORAL_TABLET | Freq: Every day | ORAL | 3 refills | Status: DC

## 2017-03-10 NOTE — Telephone Encounter (Signed)
Called patient and he stated that he needs for me to resend the [facility] patient assistance and to also send a prescription for the Farxiga 10 mg and the Jardiance so his pharmacy can run them through and see which one will be more cost efficient.   Will it be the Jardiance 25 mg? Please advise.

## 2017-03-10 NOTE — Telephone Encounter (Signed)
I have refaxed the paperwork for the [facility] patient assistance and I have also sent both prescriptions for the Jardiance 25 mg and the Farxiga 10 mg to see if one of these will be a better cost.

## 2017-03-10 NOTE — Telephone Encounter (Signed)
Jardiance dose is 25 mg

## 2017-03-12 DIAGNOSIS — R21 Rash and other nonspecific skin eruption: Secondary | ICD-10-CM | POA: Diagnosis not present

## 2017-03-13 NOTE — Telephone Encounter (Signed)
Pt has received the invokana 300 The dermatologist put him on prednisone for the next 12 days he will be taking the 10 mg 3 tabs for the 1st 3 days 2 for 3 days then 1 for the remaining days.Marland Kitchen.its a step down for 12 days  Please advise on how the pt should adjust to compensate.  Please respond to [facility] please

## 2017-03-15 NOTE — Telephone Encounter (Signed)
If his blood sugar goes over 300 with starting prednisone he will need to start insulin and needs to see Bonita QuinLinda for instructions, to use NovoLog mix 70/30,  12 units in the morning and 6 units at supper

## 2017-03-16 ENCOUNTER — Telehealth: Payer: Self-pay

## 2017-03-16 NOTE — Telephone Encounter (Signed)
I called patient and he told me that he still has Invokana 100 and Glipizide 5 mg and 2 1/2 mg left and he stated that he is only having high blood sugars at night around 11:30pm and was in the 300's. His blood sugars have been in the 100's before he started the Prednisone. He is asking if he can take some of this to get him through until he finishes the Prednisone since he only has about 7 more days left.   Please advise.

## 2017-03-16 NOTE — Telephone Encounter (Signed)
Called patient and left a voice message about the Novolog Mix 70/30 and to have him call us back so I can go over instructions and to see Bonita QuinLinda to get him started on injectable insulin.

## 2017-03-16 NOTE — Telephone Encounter (Signed)
Patient would [facility] a call [facility] about [facility]- please call [phone]

## 2017-03-16 NOTE — Telephone Encounter (Signed)
Called patient and left a voice message to give him the dosage of the Glipizide while he is on the Prednisone.

## 2017-03-16 NOTE — Telephone Encounter (Signed)
He is supposed to be taking 10 mg of glipizide ER total, he may continue to do this while on prednisone

## 2017-03-17 DIAGNOSIS — L308 Other specified dermatitis: Secondary | ICD-10-CM | POA: Diagnosis not present

## 2017-03-18 DIAGNOSIS — Z4802 Encounter for removal of sutures: Secondary | ICD-10-CM | POA: Diagnosis not present

## 2017-03-18 DIAGNOSIS — L258 Unspecified contact dermatitis due to other agents: Secondary | ICD-10-CM | POA: Diagnosis not present

## 2017-03-30 ENCOUNTER — Other Ambulatory Visit: Payer: Self-pay | Admitting: Endocrinology

## 2017-04-08 DIAGNOSIS — L308 Other specified dermatitis: Secondary | ICD-10-CM | POA: Diagnosis not present

## 2017-04-08 DIAGNOSIS — L720 Epidermal cyst: Secondary | ICD-10-CM | POA: Diagnosis not present

## 2017-04-15 ENCOUNTER — Ambulatory Visit: Payer: BLUE CROSS/BLUE SHIELD | Admitting: Endocrinology

## 2017-04-18 DIAGNOSIS — L720 Epidermal cyst: Secondary | ICD-10-CM | POA: Diagnosis not present

## 2017-04-18 DIAGNOSIS — L258 Unspecified contact dermatitis due to other agents: Secondary | ICD-10-CM | POA: Diagnosis not present

## 2017-05-11 DIAGNOSIS — L259 Unspecified contact dermatitis, unspecified cause: Secondary | ICD-10-CM | POA: Insufficient documentation

## 2017-05-11 DIAGNOSIS — L249 Irritant contact dermatitis, unspecified cause: Secondary | ICD-10-CM | POA: Diagnosis not present

## 2017-05-28 ENCOUNTER — Ambulatory Visit: Payer: BLUE CROSS/BLUE SHIELD | Admitting: Family Medicine

## 2017-05-28 ENCOUNTER — Encounter: Payer: Self-pay | Admitting: Family Medicine

## 2017-05-28 ENCOUNTER — Telehealth: Payer: Self-pay

## 2017-05-28 VITALS — BP 120/78 | HR 68 | Temp 98.5°F | Wt 157.5 lb

## 2017-05-28 DIAGNOSIS — F1090 Alcohol use, unspecified, uncomplicated: Secondary | ICD-10-CM

## 2017-05-28 DIAGNOSIS — K219 Gastro-esophageal reflux disease without esophagitis: Secondary | ICD-10-CM

## 2017-05-28 DIAGNOSIS — G4700 Insomnia, unspecified: Secondary | ICD-10-CM | POA: Diagnosis not present

## 2017-05-28 DIAGNOSIS — F4322 Adjustment disorder with anxiety: Secondary | ICD-10-CM | POA: Diagnosis not present

## 2017-05-28 DIAGNOSIS — R21 Rash and other nonspecific skin eruption: Secondary | ICD-10-CM

## 2017-05-28 DIAGNOSIS — E1165 Type 2 diabetes mellitus with hyperglycemia: Secondary | ICD-10-CM | POA: Diagnosis not present

## 2017-05-28 DIAGNOSIS — Z789 Other specified health status: Secondary | ICD-10-CM

## 2017-05-28 DIAGNOSIS — Z7289 Other problems related to lifestyle: Secondary | ICD-10-CM

## 2017-05-28 MED ORDER — ACYCLOVIR 200 MG PO CAPS: 200.0000 mg | ORAL_CAPSULE | Freq: Three times a day (TID) | ORAL | 3 refills | Status: DC

## 2017-05-28 MED ORDER — CITALOPRAM HYDROBROMIDE 10 MG PO TABS: 10.0000 mg | ORAL_TABLET | Freq: Every day | ORAL | 3 refills | Status: DC

## 2017-05-28 MED ORDER — PANTOPRAZOLE SODIUM 20 MG PO TBEC: 20.0000 mg | DELAYED_RELEASE_TABLET | Freq: Every day | ORAL | 3 refills | Status: DC

## 2017-05-28 NOTE — Assessment & Plan Note (Addendum)
Acute worsening despite buspar. Reviewed family stressors contributing. Couples counseling planned. Support provided. Will add celexa 10mg  daily, if doing well consider taper off buspar. RTC 1 mo f/u visit PHQ9 = 14, somewhat difficult to function GAD7 = 13

## 2017-05-28 NOTE — Assessment & Plan Note (Signed)
Change PPI as we're starting celexa.

## 2017-05-28 NOTE — Assessment & Plan Note (Addendum)
Continue endo f/u he will reschedule appt

## 2017-05-28 NOTE — Progress Notes (Signed)
BP 120/78 (BP Location: Left Arm, Patient Position: Sitting, Cuff Size: Normal)   Pulse 68   Temp 98.5 F (36.9 C) (Oral)   Wt 157 lb 8 oz (71.4 kg)   SpO2 96%   BMI 24.67 kg/m    CC: f/u rash, discuss stressors Subjective:    Patient ID: Andrew Hart, male    DOB: 1971/01/25, 47 y.o.   MRN: [id number]  HPI: Andrew Hart is a 47 y.o. male presenting on 05/28/2017 for Rash (Here for follow-up)   See prior note for details. Seen here 12/2016, thought nummular eczema vs drug reaction treated in office with IM dexamethasone 52m and topical clobetasol cream. As no better, referred [facility] derm. [facility]Dr SLedell Peoples - treated with oral prednisone - did not tolerate this well. [facility] Note from 05/2017 reviewed - contact dermatitis with prurigo nodules, treated with clobetasol and TCI creams - this has significantly helped. This all may have started after poison ivy exposure.   Significant family stressors - teenage daughter, wife is not on same page. Going to couples counseling. Daughter has had counseling as well. Has started smoking MJ. He quit drinking 1 wk ago (previously 6-7 beers/day). buspar not as effective as prior.   DM - on farxiga, actos, janumet, and glipizide. Followed by endo. Trouble with insurance. Planned f/u with Dr KDwyane Dee   Relevant past medical, surgical, family and social history reviewed and updated as indicated. Interim medical history since our last visit reviewed. Allergies and medications reviewed and updated. Outpatient Medications Prior to Visit  Medication Sig Dispense Refill  . aspirin 81 MG tablet Take 81 mg by mouth daily.    .Marland KitchenBAYER MICROLET LANCETS lancets Use as instructed to check blood sugar twice a day dx E11.65 100 each 3  . Blood Glucose Monitoring Suppl (ACCU-CHEK GUIDE) w/Device KIT 1 kit by Does not apply route 2 (two) times daily. Use to check blood sugar twice a day 1 kit 2  .  busPIRone (BUSPAR) 10 MG tablet TAKE 1 TABLET (10 MG TOTAL) BY MOUTH 2 TIMES DAILY. 180 tablet 1  . canagliflozin (INVOKANA) 300 MG TABS tablet Take 1 tablet (300 mg total) daily before breakfast by mouth. 30 tablet 3  . clobetasol cream (TEMOVATE) 07.20% Apply 1 application topically 2 (two) times daily. Apply to AA. No more than 2 weeks at a time 60 g 0  . glipiZIDE (GLUCOTROL XL) 10 MG 24 hr tablet Take 1 tablet (10 mg total) daily with breakfast by mouth. 30 tablet 3  . glucose blood (ACCU-CHEK GUIDE) test strip Use to check blood sugars twice daily 100 each 3  . pioglitazone (ACTOS) 30 MG tablet TAKE 1 TABLET BY MOUTH DAILY. 30 tablet 3  . simvastatin (ZOCOR) 80 MG tablet TAKE 1 TABLET BY MOUTH DAILY. 30 tablet 4  . traZODone (DESYREL) 150 MG tablet TAKE 1/2 TABLET (75 MG TOTAL) BY MOUTH AT BEDTIME. 45 tablet 3  . triamcinolone cream (KENALOG) 0.1 % Apply to affected area twice daily. Do not apply to face.    .Marland Kitchenacyclovir (ZOVIRAX) 200 MG capsule Take 1 capsule (200 mg total) by mouth 3 (three) times daily. For 3-5 days as needed 30 capsule 3  . omeprazole (PRILOSEC OTC) 20 MG tablet Take 20 mg by mouth daily as needed.    .Marland Kitchenomeprazole (PRILOSEC) 20 MG capsule Take 20 mg by mouth daily.    . dapagliflozin propanediol (  FARXIGA) 10 MG TABS tablet Take 10 mg daily by mouth. 30 tablet 3  . empagliflozin (JARDIANCE) 25 MG TABS tablet Take 25 mg daily by mouth. 30 tablet 3  . JANUMET XR [phone] MG TB24 TAKE 1 TABLET BY MOUTH DAILY (Patient not taking: Reported on 01/07/2017) 30 tablet 3  . sitaGLIPtin (JANUVIA) 100 MG tablet Take 1 tablet (100 mg total) by mouth daily. (Patient not taking: Reported on 03/09/2017) 30 tablet 3   No facility-administered medications prior to visit.      Per HPI unless specifically indicated in ROS section below Review of Systems     Objective:    BP 120/78 (BP Location: Left Arm, Patient Position: Sitting, Cuff Size: Normal)   Pulse 68   Temp 98.5 F (36.9 C)  (Oral)   Wt 157 lb 8 oz (71.4 kg)   SpO2 96%   BMI 24.67 kg/m   Wt Readings from Last 3 Encounters:  05/28/17 157 lb 8 oz (71.4 kg)  03/09/17 162 lb 3.2 oz (73.6 kg)  01/07/17 164 lb 9.6 oz (74.7 kg)    Physical Exam  Constitutional: He appears well-developed and well-nourished. No distress.  Skin: Skin is warm and dry. Rash noted.  Post inflammatory hyperpigmentation of bilateral extremities.   Psychiatric: His speech is normal and behavior is normal. Judgment and thought content normal. His mood appears anxious. His affect is not angry. Cognition and memory are normal. He exhibits a depressed mood.  Nursing note and vitals reviewed.  Results for orders placed or performed in visit on 03/06/17  Hemoglobin A1c  Result Value Ref Range   Hgb A1c MFr Bld 10.1 (H) 4.6 - 6.5 %  Basic metabolic panel  Result Value Ref Range   Sodium 135 135 - 145 mEq/L   Potassium 4.2 3.5 - 5.1 mEq/L   Chloride 102 96 - 112 mEq/L   CO2 28 19 - 32 mEq/L   Glucose, Bld 318 (H) 70 - 99 mg/dL   BUN 12 6 - 23 mg/dL   Creatinine, Ser 0.86 0.40 - 1.50 mg/dL   Calcium 9.0 8.4 - 10.5 mg/dL   GFR 101.27 >60.00 mL/min  Microalbumin / creatinine urine ratio  Result Value Ref Range   Microalb, Ur 1.1 0.0 - 1.9 mg/dL   Creatinine,U 139.8 mg/dL   Microalb Creat Ratio 0.8 0.0 - 30.0 mg/g      Assessment & Plan:   Problem List Items Addressed This Visit    Adjustment disorder with anxiety - Primary    Acute worsening despite buspar. Reviewed family stressors contributing. Couples counseling planned. Support provided. Will add celexa 7m daily, if doing well consider taper off buspar. RTC 1 mo f/u visit PHQ9 = 14, somewhat difficult to function GAD7 = 13      Alcohol use    He endorses he stopped almost 1 wk ago. Denies difficulty stopping alcohol in the past.       GERD (gastroesophageal reflux disease)    Change PPI as we're starting celexa.       Relevant Medications   pantoprazole (PROTONIX) 20  MG tablet   Insomnia    Stable on trazodone 727mnightly. Sleep maintenance insomnia.       Skin rash    Appreciate derm care - continue TCI cream which is controlling rash well. Dx with contact dermatitis.       Uncontrolled type 2 diabetes mellitus with hyperglycemia, without long-term current use of insulin (HCC)    Continue endo f/u  he will reschedule appt          Follow up plan: Return in about 4 weeks (around 06/25/2017) for follow up visit.  Ria Bush, MD

## 2017-05-28 NOTE — Patient Instructions (Signed)
Stop omeprazole Trial protonix (pantoprazole) 20mg  as needed for heartburn. Continue buspar, start celexa 10mg  daily for mood.  Return in 1 month for follow up visit.

## 2017-05-28 NOTE — Telephone Encounter (Signed)
Patient has been approved for patient assistance for invokana through [employer] until 05/26/18 patient has been notified

## 2017-05-28 NOTE — Assessment & Plan Note (Signed)
He endorses he stopped almost 1 wk ago. Denies difficulty stopping alcohol in the past.

## 2017-05-28 NOTE — Assessment & Plan Note (Signed)
Appreciate derm care - continue TCI cream which is controlling rash well. Dx with contact dermatitis.

## 2017-05-28 NOTE — Assessment & Plan Note (Signed)
Stable on trazodone 75mg  nightly. Sleep maintenance insomnia.

## 2017-05-29 ENCOUNTER — Ambulatory Visit: Payer: Self-pay | Admitting: *Deleted

## 2017-05-29 NOTE — Telephone Encounter (Signed)
Patient took his first dose of Celexa this morning. He has noticed that he is having some soreness in his jaw joint on the right today. He reports no other symptoms- no rash, swelling, dizziness, vision changes. He would like to know if he needs to continue the medication over the week end. He can be reached at [phone] Answer Assessment - Initial Assessment Questions 1. SYMPTOMS: "Do you have any symptoms?"     Right side of face- feels sore when chews, opens mouth or talks. 2. SEVERITY: If symptoms are present, ask "Are they mild, moderate or severe?"     Soreness in the joint- just on the right side.  Protocols used: MEDICATION QUESTION CALL-A-AH

## 2017-05-30 ENCOUNTER — Encounter: Payer: Self-pay | Admitting: Family Medicine

## 2017-05-30 NOTE — Telephone Encounter (Signed)
I'm only getting this message now.  Responded via mychart - don't think celexa is causing trouble. Misty StanleyLisa - in future for an urgent message Fri afternoon when I'm out of office can you either text me notifying me it's pending or ask a provider in office? Thanks.

## 2017-06-01 NOTE — Telephone Encounter (Signed)
Noted  

## 2017-06-15 DIAGNOSIS — L259 Unspecified contact dermatitis, unspecified cause: Secondary | ICD-10-CM | POA: Diagnosis not present

## 2017-06-15 DIAGNOSIS — L281 Prurigo nodularis: Secondary | ICD-10-CM | POA: Diagnosis not present

## 2017-06-15 DIAGNOSIS — L249 Irritant contact dermatitis, unspecified cause: Secondary | ICD-10-CM | POA: Diagnosis not present

## 2017-06-25 ENCOUNTER — Encounter: Payer: Self-pay | Admitting: Family Medicine

## 2017-06-25 ENCOUNTER — Ambulatory Visit: Payer: BLUE CROSS/BLUE SHIELD | Admitting: Family Medicine

## 2017-06-25 VITALS — BP 116/70 | HR 68 | Temp 97.8°F | Wt 152.5 lb

## 2017-06-25 DIAGNOSIS — E1165 Type 2 diabetes mellitus with hyperglycemia: Secondary | ICD-10-CM | POA: Diagnosis not present

## 2017-06-25 DIAGNOSIS — Z789 Other specified health status: Secondary | ICD-10-CM | POA: Diagnosis not present

## 2017-06-25 DIAGNOSIS — F4322 Adjustment disorder with anxiety: Secondary | ICD-10-CM

## 2017-06-25 DIAGNOSIS — Z7289 Other problems related to lifestyle: Secondary | ICD-10-CM

## 2017-06-25 MED ORDER — CITALOPRAM HYDROBROMIDE 20 MG PO TABS: 20.0000 mg | ORAL_TABLET | Freq: Every day | ORAL | 3 refills | Status: DC

## 2017-06-25 NOTE — Progress Notes (Signed)
BP 116/70 (BP Location: Left Arm, Patient Position: Sitting, Cuff Size: Normal)   Pulse 68   Temp 97.8 F (36.6 C) (Oral)   Wt 152 lb 8 oz (69.2 kg)   SpO2 97%   BMI 23.88 kg/m    CC: 1 mo f/u visit anxiety Subjective:    Patient ID: Andrew Hart, male    DOB: [birth year], 47 y.o.   MRN: [id number]  HPI: Andrew Hart is a 47 y.o. male presenting on 06/25/2017 for Anxiety (Follow-up. )   See prior note for details.  Acute worsening of anxiety despite buspar use due to family stressors - last visit we added celexa 88m daily. He has started couples counseling x3 (one individual).  High stress last few days. Wife had been taking care of her aunt with liver failure - passed away yesterday. Struggling with ongoing stressors, low energy.  He also takes trazodone 73mfor sleep.   Alcohol use - he stopped drinking since 06/07/2017. Encouraged ongoing abstinence. Recent [place] with [place] ac[place] [facility]   Rash - ongoing struggle as well. Saw derm recently.   Relevant past medical, surgical, family and social history reviewed and updated as indicated. Interim medical history since our last visit reviewed. Allergies and medications reviewed and updated. Outpatient Medications Prior to Visit  Medication Sig Dispense Refill  . acyclovir (ZOVIRAX) 200 MG capsule Take 1 capsule (200 mg total) by mouth 3 (three) times daily. For 3-5 days as needed 30 capsule 3  . aspirin 81 MG tablet Take 81 mg by mouth daily.    . Marland KitchenAYER MICROLET LANCETS lancets Use as instructed to check blood sugar twice a day dx E11.65 100 each 3  . Blood Glucose Monitoring Suppl (ACCU-CHEK GUIDE) w/Device KIT 1 kit by Does not apply route 2 (two) times daily. Use to check blood sugar twice a day 1 kit 2  . busPIRone (BUSPAR) 10 MG tablet TAKE 1 TABLET (10 MG TOTAL) BY MOUTH 2 TIMES DAILY. 180 tablet 1  . canagliflozin (INVOKANA) 300 MG TABS tablet Take 1 tablet (300 mg total) daily before breakfast by mouth.  30 tablet 3  . clobetasol cream (TEMOVATE) 0.1.00 Apply 1 application topically 2 (two) times daily. Apply to AA. No more than 2 weeks at a time 60 g 0  . glipiZIDE (GLUCOTROL XL) 10 MG 24 hr tablet Take 1 tablet (10 mg total) daily with breakfast by mouth. 30 tablet 3  . glucose blood (ACCU-CHEK GUIDE) test strip Use to check blood sugars twice daily 100 each 3  . pantoprazole (PROTONIX) 20 MG tablet Take 1 tablet (20 mg total) by mouth daily. 30 tablet 3  . pioglitazone (ACTOS) 30 MG tablet TAKE 1 TABLET BY MOUTH DAILY. 30 tablet 3  . simvastatin (ZOCOR) 80 MG tablet TAKE 1 TABLET BY MOUTH DAILY. 30 tablet 4  . traZODone (DESYREL) 150 MG tablet TAKE 1/2 TABLET (75 MG TOTAL) BY MOUTH AT BEDTIME. 45 tablet 3  . triamcinolone cream (KENALOG) 0.1 % Apply to affected area twice daily. Do not apply to face.    . citalopram (CELEXA) 10 MG tablet Take 1 tablet (10 mg total) by mouth daily. 30 tablet 3   No facility-administered medications prior to visit.      Per HPI unless specifically indicated in ROS section below Review of Systems     Objective:    BP 116/70 (BP Location: Left Arm, Patient Position: Sitting, Cuff Size: Normal)  Pulse 68   Temp 97.8 F (36.6 C) (Oral)   Wt 152 lb 8 oz (69.2 kg)   SpO2 97%   BMI 23.88 kg/m   Wt Readings from Last 3 Encounters:  06/25/17 152 lb 8 oz (69.2 kg)  05/28/17 157 lb 8 oz (71.4 kg)  03/09/17 162 lb 3.2 oz (73.6 kg)    Physical Exam  Constitutional: He appears well-developed and well-nourished. No distress.  Psychiatric: He has a normal mood and affect.  Nursing note and vitals reviewed.  Results for orders placed or performed in visit on 03/06/17  Hemoglobin A1c  Result Value Ref Range   Hgb A1c MFr Bld 10.1 (H) 4.6 - 6.5 %  Basic metabolic panel  Result Value Ref Range   Sodium 135 135 - 145 mEq/L   Potassium 4.2 3.5 - 5.1 mEq/L   Chloride 102 96 - 112 mEq/L   CO2 28 19 - 32 mEq/L   Glucose, Bld 318 (H) 70 - 99 mg/dL   BUN 12 6  - 23 mg/dL   Creatinine, Ser 0.86 0.40 - 1.50 mg/dL   Calcium 9.0 8.4 - 10.5 mg/dL   GFR 101.27 >60.00 mL/min  Microalbumin / creatinine urine ratio  Result Value Ref Range   Microalb, Ur 1.1 0.0 - 1.9 mg/dL   Creatinine,U 139.8 mg/dL   Microalb Creat Ratio 0.8 0.0 - 30.0 mg/g      Assessment & Plan:   Problem List Items Addressed This Visit    Adjustment disorder with anxiety - Primary    Ongoing struggle, increased stressors recently. Not much better on celexa. Will increase to 13m daily. Continue buspar 121mbid, continue trazodone 7584mightly. Discussed benzo PRN - pt hesitant to try this as prior xanax use made him "mean". He also has h/o significant alcohol use.  RTC 4-6 wks f/u visit.  PHQ9 = 14 --> 11 GAD7 = 13 -->12      Alcohol use    Abstinent over the past month.       Uncontrolled type 2 diabetes mellitus with hyperglycemia, without long-term current use of insulin ([facility]  Upcoming appt with endo. Reports sugars 140s          Meds ordered this encounter  Medications  . citalopram (CELEXA) 20 MG tablet    Sig: Take 1 tablet (20 mg total) by mouth daily.    Dispense:  30 tablet    Refill:  3   No orders of the defined types were placed in this encounter.   Follow up plan: No Follow-up on file.  JavRia BushD

## 2017-06-25 NOTE — Assessment & Plan Note (Signed)
Upcoming appt with endo. Reports sugars 140s

## 2017-06-25 NOTE — Patient Instructions (Addendum)
Continue buspar and trazodone. Increase celexa to 20mg  daily for 1 month.  Return to see me in 4-6 weeks for follow up.  Work on stress relieving strategies.

## 2017-06-25 NOTE — Assessment & Plan Note (Addendum)
Ongoing struggle, increased stressors recently. Not much better on celexa. Will increase to 20mg  daily. Continue buspar 10mg  bid, continue trazodone 75mg  nightly. Discussed benzo PRN - pt hesitant to try this as prior xanax use made him "mean". He also has h/o significant alcohol use.  RTC 4-6 wks f/u visit.  PHQ9 = 14 --> 11 GAD7 = 13 -->12

## 2017-06-25 NOTE — Assessment & Plan Note (Signed)
Abstinent over the past month.

## 2017-07-01 ENCOUNTER — Other Ambulatory Visit: Payer: Self-pay | Admitting: Family Medicine

## 2017-07-01 ENCOUNTER — Other Ambulatory Visit: Payer: Self-pay | Admitting: Endocrinology

## 2017-07-06 ENCOUNTER — Other Ambulatory Visit (INDEPENDENT_AMBULATORY_CARE_PROVIDER_SITE_OTHER): Payer: BLUE CROSS/BLUE SHIELD

## 2017-07-06 DIAGNOSIS — E1165 Type 2 diabetes mellitus with hyperglycemia: Secondary | ICD-10-CM | POA: Diagnosis not present

## 2017-07-06 LAB — LIPID PANEL
Cholesterol: 169 mg/dL (ref 0–200)
HDL: 44.6 mg/dL (ref >39.00)
LDL Cholesterol: 98 mg/dL (ref 0–99)
NonHDL: 124.27
TRIGLYCERIDES: 133 mg/dL (ref 0.0–149.0)
Total CHOL/HDL Ratio: 4
VLDL: 26.6 mg/dL (ref 0.0–40.0)

## 2017-07-06 LAB — COMPREHENSIVE METABOLIC PANEL
ALK PHOS: 91 U/L (ref 39–117)
ALT: 14 U/L (ref 0–53)
AST: 14 U/L (ref 0–37)
Albumin: 4 g/dL (ref 3.5–5.2)
BUN: 14 mg/dL (ref 6–23)
CO2: 25 meq/L (ref 19–32)
Calcium: 9.4 mg/dL (ref 8.4–10.5)
Chloride: 105 mEq/L (ref 96–112)
Creatinine, Ser: 0.86 mg/dL (ref 0.40–1.50)
GFR: 101.13 mL/min (ref >60.00)
GLUCOSE: 143 mg/dL — AB (ref 70–99)
Potassium: 3.9 mEq/L (ref 3.5–5.1)
SODIUM: 138 meq/L (ref 135–145)
TOTAL PROTEIN: 6.9 g/dL (ref 6.0–8.3)
Total Bilirubin: 0.7 mg/dL (ref 0.2–1.2)

## 2017-07-06 LAB — MICROALBUMIN / CREATININE URINE RATIO
CREATININE, U: 84 mg/dL
MICROALB/CREAT RATIO: 0.8 mg/g (ref 0.0–30.0)
Microalb, Ur: <0.7 mg/dL (ref 0.0–1.9)

## 2017-07-06 LAB — HEMOGLOBIN A1C: Hgb A1c MFr Bld: 9 % — ABNORMAL HIGH (ref 4.6–6.5)

## 2017-07-08 ENCOUNTER — Ambulatory Visit: Payer: BLUE CROSS/BLUE SHIELD | Admitting: Endocrinology

## 2017-07-08 ENCOUNTER — Encounter: Payer: Self-pay | Admitting: Endocrinology

## 2017-07-08 VITALS — BP 120/88 | HR 74 | Ht 67.0 in | Wt 151.6 lb

## 2017-07-08 DIAGNOSIS — E1165 Type 2 diabetes mellitus with hyperglycemia: Secondary | ICD-10-CM | POA: Diagnosis not present

## 2017-07-08 MED ORDER — SITAGLIPTIN PHOSPHATE 100 MG PO TABS: 100.0000 mg | ORAL_TABLET | Freq: Every day | ORAL | 3 refills | Status: DC

## 2017-07-08 NOTE — Progress Notes (Signed)
Patient ID: Andrew Hart, male   DOB: 06-16-1970, 47 y.o.   MRN: [id number]           Reason for Appointment: Follow-up for Type 2 Diabetes   History of Present Illness:          Date of diagnosis of type 2 diabetes mellitus: 2007 ?        Background history:  He was diagnosed with routine screening lab work He was initially tried on metformin but this caused diarrhea and he could not continue this Subsequently was treated with glipizide and apparently this was able to control his blood sugars fairly well for a few years Probably in early 2016 he was given Actos in addition to help his glucose control as A1c had gone up to 9.4 This helped transiently and A1c was 7.7 However his A1c had been otherwise over 8% since mid 2015 Because of  high blood sugars on his initial consultation in 05/2014 (A1c was 9.1) he was started on Invokana and Onglyza in addition to his Actos and glipizide.   Recent history:  Non-insulin hypoglycemic drugs the patient is taking are: Glipizide ER 10 mg daily, Actos 30 mg daily and Invokana 300 mg daily Previously on Januvia/Janumet  His A1c is now 9%, previous range 7-10.1  Current blood sugar patterns and problems identified:  He has been taking Invokana after his last visit and previously had been on Jardiance  However appears to have lost significant amount of weight and he does not know why  He thinks he is checking his blood sugar sometimes in the morning but not after meals and these are mostly around 140+  Lab glucose was 143 fasting  He does not feel hypoglycemic with taking glipizide ER, now taking 10 mg  Also no swelling of feet with taking Actos  He thinks he is generally trying to be eating healthy and avoiding sweets        Side effects from medications have been: Diarrhea from regular metformin  Compliance with the medical regimen: Fair Hypoglycemia: None   Glucose monitoring:  done 0-1  times a d ay         Glucometer:   Accu-Chek    Blood Glucose readings 140s fasting  Self-care: The diet that the patient has been following is: tries to limit sweets      Typical meal intake: Breakfast is a breakfast bar on weekdays, eggs, bacon and grits on weekends Lunch = sandwich, soup Dinner =  chicken and pasta.  Snacks will be nuts and cheese                Dietician visit, most recent: 06/2015               Exercise: at work  Weight history:   Wt Readings from Last 3 Encounters:  07/08/17 151 lb 9.6 oz (68.8 kg)  06/25/17 152 lb 8 oz (69.2 kg)  05/28/17 157 lb 8 oz (71.4 kg)    Glycemic control:    Lab Results  Component Value Date   HGBA1C 9.0 (H) 07/06/2017   HGBA1C 10.1 (H) 03/06/2017   HGBA1C 7.8 (H) 01/01/2017   Lab Results  Component Value Date   MICROALBUR <0.7 07/06/2017   LDLCALC 98 07/06/2017   CREATININE 0.86 07/06/2017    Lab on 07/06/2017  Component Date Value Ref Range Status  . Cholesterol 07/06/2017 169  0 - 200 mg/dL Final   ATP III Classification  Desirable:  < 200 mg/dL               Borderline High:  200 - 239 mg/dL          High:  > = 240 mg/dL  . Triglycerides 07/06/2017 133.0  0.0 - 149.0 mg/dL Final   Normal:  <150 mg/dLBorderline High:  150 - 199 mg/dL  . HDL 07/06/2017 44.60  >39.00 mg/dL Final  . VLDL 07/06/2017 26.6  0.0 - 40.0 mg/dL Final  . LDL Cholesterol 07/06/2017 98  0 - 99 mg/dL Final  . Total CHOL/HDL Ratio 07/06/2017 4   Final                  Men          Women1/2 Average Risk     3.4          3.3Average Risk          5.0          4.42X Average Risk          9.6          7.13X Average Risk          15.0          11.0                      . NonHDL 07/06/2017 124.27   Final   NOTE:  Non-HDL goal should be 30 mg/dL higher than patient's LDL goal (i.e. LDL goal of < 70 mg/dL, would have non-HDL goal of < 100 mg/dL)  . Microalb, Ur 07/06/2017 <0.7  0.0 - 1.9 mg/dL Final  . Creatinine,U 07/06/2017 84.0  mg/dL Final  . Microalb Creat Ratio 07/06/2017 0.8   0.0 - 30.0 mg/g Final  . Sodium 07/06/2017 138  135 - 145 mEq/L Final  . Potassium 07/06/2017 3.9  3.5 - 5.1 mEq/L Final  . Chloride 07/06/2017 105  96 - 112 mEq/L Final  . CO2 07/06/2017 25  19 - 32 mEq/L Final  . Glucose, Bld 07/06/2017 143* 70 - 99 mg/dL Final  . BUN 07/06/2017 14  6 - 23 mg/dL Final  . Creatinine, Ser 07/06/2017 0.86  0.40 - 1.50 mg/dL Final  . Total Bilirubin 07/06/2017 0.7  0.2 - 1.2 mg/dL Final  . Alkaline Phosphatase 07/06/2017 91  39 - 117 U/L Final  . AST 07/06/2017 14  0 - 37 U/L Final  . ALT 07/06/2017 14  0 - 53 U/L Final  . Total Protein 07/06/2017 6.9  6.0 - 8.3 g/dL Final  . Albumin 07/06/2017 4.0  3.5 - 5.2 g/dL Final  . Calcium 07/06/2017 9.4  8.4 - 10.5 mg/dL Final  . GFR 07/06/2017 101.13  >60.00 mL/min Final  . Hgb A1c MFr Bld 07/06/2017 9.0* 4.6 - 6.5 % Final   Glycemic Control Guidelines for People with Diabetes:Non Diabetic:  <6%Goal of Therapy: <7%Additional Action Suggested:  >8%       Allergies as of 07/08/2017      Reactions   Janumet Xr [sitagliptin-metformin Hcl Er] Rash   Jardiance [empagliflozin] Rash   Metformin And Related Diarrhea      Medication List        Accurate as of 07/08/17 10:16 AM. Always use your most recent med list.          ACCU-CHEK GUIDE w/Device Kit 1 kit by Does not apply route 2 (two) times daily. Use to check blood sugar twice a  day   acyclovir 200 MG capsule Commonly known as:  ZOVIRAX Take 1 capsule (200 mg total) by mouth 3 (three) times daily. For 3-5 days as needed   aspirin 81 MG tablet Take 81 mg by mouth daily.   BAYER MICROLET LANCETS lancets Use as instructed to check blood sugar twice a day dx E11.65   busPIRone 10 MG tablet Commonly known as:  BUSPAR TAKE 1 TABLET (10 MG TOTAL) BY MOUTH 2 TIMES DAILY.   canagliflozin 300 MG Tabs tablet Commonly known as:  INVOKANA Take 1 tablet (300 mg total) daily before breakfast by mouth.   citalopram 20 MG tablet Commonly known as:   CELEXA Take 1 tablet (20 mg total) by mouth daily.   clobetasol cream 0.05 % Commonly known as:  TEMOVATE Apply 1 application topically 2 (two) times daily. Apply to AA. No more than 2 weeks at a time   glipiZIDE 10 MG 24 hr tablet Commonly known as:  GLUCOTROL XL TAKE 1 TABLET (10 MG TOTAL) DAILY WITH BREAKFAST BY MOUTH.   glucose blood test strip Commonly known as:  ACCU-CHEK GUIDE Use to check blood sugars twice daily   pantoprazole 20 MG tablet Commonly known as:  PROTONIX Take 1 tablet (20 mg total) by mouth daily.   pioglitazone 30 MG tablet Commonly known as:  ACTOS TAKE 1 TABLET BY MOUTH DAILY.   simvastatin 80 MG tablet Commonly known as:  ZOCOR TAKE 1 TABLET BY MOUTH DAILY.   traZODone 150 MG tablet Commonly known as:  DESYREL TAKE 1/2 TABLET (75 MG TOTAL) BY MOUTH AT BEDTIME.   triamcinolone cream 0.1 % Commonly known as:  KENALOG Apply to affected area twice daily. Do not apply to face.       Allergies:  Allergies  Allergen Reactions  . Janumet Xr [Sitagliptin-Metformin Hcl Er] Rash  . Jardiance [Empagliflozin] Rash  . Metformin And Related Diarrhea    Past Medical History:  Diagnosis Date  . Alcohol use 11/08/2015  . Diabetes mellitus without complication ([place]) 5003   Sheryle Vice  . GERD (gastroesophageal reflux disease)   . Herpes labialis   . History of chicken pox   . Hyperlipidemia     Past Surgical History:  Procedure Laterality Date  . APPENDECTOMY    . FINGER SURGERY Right    2 screws in ring finger  . TONSILLECTOMY      Family History  Problem Relation Age of Onset  . Diabetes Mother   . CAD Father 9       MI at 67, CABG at 19  . Diabetes Maternal Grandmother   . Parkinsonism Mother 82       progressive supranuclear palsy, deceased  . Stroke Neg Hx   . Cancer Neg Hx     Social History:  reports that he has been smoking cigarettes.  He has a 7.50 pack-year smoking history. he has never used smokeless tobacco. He reports that he  drinks alcohol. He reports that he uses drugs.    Review of Systems    Lipid history:  Has been on 80 mg simvastatin for treatment, prescribed by PCP  His lipids are well controlled    Lab Results  Component Value Date   CHOL 169 07/06/2017   HDL 44.60 07/06/2017   LDLCALC 98 07/06/2017   TRIG 133.0 07/06/2017   CHOLHDL 4 07/06/2017            Most recent foot exam: 5/18  He has not discussed his weight  loss with his PCP  He has seen a second dermatologist and is taking steroid creams for his rash which is only better on his arms  Review of Systems     Physical Examination:  BP 120/88 (BP Location: Left Arm, Patient Position: Sitting, Cuff Size: Normal)   Pulse 74   Ht '5\' 7"'  (1.702 m)   Wt 151 lb 9.6 oz (68.8 kg)   SpO2 96%   BMI 23.74 kg/m    ASSESSMENT:  Diabetes type 2, non-insulin-dependent  See history of present illness for detailed discussion of current diabetes management, blood sugar patterns and problems identified  His A1c is still high at 9% despite taking 3 drug regimen including glipizide ER 10 mg and Invokana He had previously done well with Januvia/Janumet but this was stopped because of presumed relationship with his rash Since his fasting readings are not more than about 140 he likely is getting high postprandial readings  LIPIDS: Well controlled with simvastatin  PLAN:     Start checking blood sugars after meals more consistently unless fasting  Trial of Januvia  Discussed that this will help some postprandial hyperglycemia and he needs additional treatment  Unlikely that this had caused rash in the past and he should try and take this  If this is not effective may consider a GLP-1 drug  Follow-up with PCP to evaluate weight loss   There are no Patient Instructions on file for this visit.   Elayne Snare 07/08/2017, 10:16 AM   Note: This office note was prepared with Dragon voice recognition system technology. Any  transcriptional errors that result from this process are unintentional.

## 2017-07-08 NOTE — Patient Instructions (Signed)
Check blood sugars on waking up  2-3/7  Also check blood sugars about 2 hours after a meal and do this after different meals by rotation  Recommended blood sugar levels on waking up is 90-130 and about 2 hours after meal is 130-160  Please bring your blood sugar monitor to each visit, thank you   

## 2017-07-28 ENCOUNTER — Other Ambulatory Visit: Payer: Self-pay | Admitting: Endocrinology

## 2017-07-30 ENCOUNTER — Ambulatory Visit: Payer: BLUE CROSS/BLUE SHIELD | Admitting: Family Medicine

## 2017-07-30 ENCOUNTER — Encounter: Payer: Self-pay | Admitting: Family Medicine

## 2017-07-30 VITALS — BP 118/68 | HR 72 | Temp 98.3°F | Wt 150.0 lb

## 2017-07-30 DIAGNOSIS — R21 Rash and other nonspecific skin eruption: Secondary | ICD-10-CM

## 2017-07-30 DIAGNOSIS — F4322 Adjustment disorder with anxiety: Secondary | ICD-10-CM

## 2017-07-30 DIAGNOSIS — R634 Abnormal weight loss: Secondary | ICD-10-CM

## 2017-07-30 DIAGNOSIS — D7589 Other specified diseases of blood and blood-forming organs: Secondary | ICD-10-CM

## 2017-07-30 LAB — CBC WITH DIFFERENTIAL/PLATELET
Basophils Absolute: 0 10*3/uL (ref 0.0–0.1)
Basophils Relative: 0.6 % (ref 0.0–3.0)
Eosinophils Absolute: 0.1 10*3/uL (ref 0.0–0.7)
Eosinophils Relative: 1.5 % (ref 0.0–5.0)
HCT: 48.8 % (ref 39.0–52.0)
Hemoglobin: 16.6 g/dL (ref 13.0–17.0)
LYMPHS ABS: 1.2 10*3/uL (ref 0.7–4.0)
Lymphocytes Relative: 23.5 % (ref 12.0–46.0)
MCHC: 34 g/dL (ref 30.0–36.0)
MCV: 103.1 fl — AB (ref 78.0–100.0)
MONOS PCT: 8 % (ref 3.0–12.0)
Monocytes Absolute: 0.4 10*3/uL (ref 0.1–1.0)
NEUTROS ABS: 3.4 10*3/uL (ref 1.4–7.7)
NEUTROS PCT: 66.4 % (ref 43.0–77.0)
PLATELETS: 178 10*3/uL (ref 150.0–400.0)
RBC: 4.73 Mil/uL (ref 4.22–5.81)
RDW: 13.8 % (ref 11.5–15.5)
WBC: 5.2 10*3/uL (ref 4.0–10.5)

## 2017-07-30 LAB — SEDIMENTATION RATE: Sed Rate: 8 mm/hr (ref 0–15)

## 2017-07-30 NOTE — Patient Instructions (Signed)
Continue current medicines.  We will check abdominal ultrasound for weight loss.  If ongoing weight loss, let me know.  Keep follow up with dermatologist.

## 2017-07-30 NOTE — Progress Notes (Signed)
BP 118/68 (BP Location: Left Arm, Patient Position: Sitting, Cuff Size: Normal)   Pulse 72   Temp 98.3 F (36.8 C) (Oral)   Wt 150 lb (68 kg)   SpO2 97%   BMI 23.49 kg/m    CC: 1 mo f/u visit Subjective:    Patient ID: SAMAR DASS, male    DOB: 09/27/1970, 47 y.o.   MRN: [id number]  HPI: JHOVANNY GUINTA is a 47 y.o. male presenting on 07/30/2017 for Anxiety (Here for 4-6 wk follow-up.)   Here for follow up from last few visits where we had started then increased celexa to 28m in addition to his buspar 142mbid and trazodone 7546mightly due to worsening family stressors, anxiety. He has also been seeing couselor (couples and individual x1). Trouble with relationship with wife and daughter.   Ongoing trouble with skin rash. Has seen 2 dermatologists. Planned allergy testing in May.   DM - glycemic control improving - followed by endocrinologist (KuDwyane DeeJust started janTongan addition to actos, invokana and glipizide. Ongoing weight loss (recent peak at 167). Denies epigastric discomfort. Endorses some early satiety.   Alcohol use - he stopped drinking since 06/07/2017. Encouraged ongoing abstinence.   Relevant past medical, surgical, family and social history reviewed and updated as indicated. Interim medical history since our last visit reviewed. Allergies and medications reviewed and updated. Outpatient Medications Prior to Visit  Medication Sig Dispense Refill  . acyclovir (ZOVIRAX) 200 MG capsule Take 1 capsule (200 mg total) by mouth 3 (three) times daily. For 3-5 days as needed 30 capsule 3  . aspirin 81 MG tablet Take 81 mg by mouth daily.    . BMarland KitchenYER MICROLET LANCETS lancets Use as instructed to check blood sugar twice a day dx E11.65 100 each 3  . Blood Glucose Monitoring Suppl (ACCU-CHEK GUIDE) w/Device KIT 1 kit by Does not apply route 2 (two) times daily. Use to check blood sugar twice a day 1 kit 2  . busPIRone (BUSPAR) 10 MG tablet TAKE 1 TABLET (10 MG TOTAL) BY  MOUTH 2 TIMES DAILY. 180 tablet 1  . canagliflozin (INVOKANA) 300 MG TABS tablet Take 1 tablet (300 mg total) daily before breakfast by mouth. 30 tablet 3  . citalopram (CELEXA) 20 MG tablet Take 1 tablet (20 mg total) by mouth daily. 30 tablet 3  . clobetasol cream (TEMOVATE) 0.03.95Apply 1 application topically 2 (two) times daily. Apply to AA. No more than 2 weeks at a time 60 g 0  . glipiZIDE (GLUCOTROL XL) 10 MG 24 hr tablet TAKE 1 TABLET (10 MG TOTAL) DAILY WITH BREAKFAST BY MOUTH. 30 tablet 3  . glucose blood (ACCU-CHEK GUIDE) test strip Use to check blood sugars twice daily 100 each 3  . pantoprazole (PROTONIX) 20 MG tablet Take 1 tablet (20 mg total) by mouth daily. 30 tablet 3  . pioglitazone (ACTOS) 30 MG tablet TAKE 1 TABLET BY MOUTH DAILY. 30 tablet 3  . simvastatin (ZOCOR) 80 MG tablet TAKE 1 TABLET BY MOUTH DAILY. 30 tablet 4  . sitaGLIPtin (JANUVIA) 100 MG tablet Take 1 tablet (100 mg total) by mouth daily. 30 tablet 3  . traZODone (DESYREL) 150 MG tablet TAKE 1/2 TABLET (75 MG TOTAL) BY MOUTH AT BEDTIME. 45 tablet 3  . triamcinolone cream (KENALOG) 0.1 % Apply to affected area twice daily. Do not apply to face.     No facility-administered medications prior to visit.      Per HPI  unless specifically indicated in ROS section below Review of Systems     Objective:    BP 118/68 (BP Location: Left Arm, Patient Position: Sitting, Cuff Size: Normal)   Pulse 72   Temp 98.3 F (36.8 C) (Oral)   Wt 150 lb (68 kg)   SpO2 97%   BMI 23.49 kg/m   Wt Readings from Last 3 Encounters:  07/30/17 150 lb (68 kg)  07/08/17 151 lb 9.6 oz (68.8 kg)  06/25/17 152 lb 8 oz (69.2 kg)    Physical Exam  Constitutional: He appears well-developed and well-nourished. No distress.  HENT:  Mouth/Throat: Oropharynx is clear and moist. No oropharyngeal exudate.  Cardiovascular: Normal rate, regular rhythm, normal heart sounds and intact distal pulses.  No murmur heard. Pulmonary/Chest: Effort  normal and breath sounds normal. No respiratory distress. He has no wheezes. He has no rales.  Abdominal: Soft. Normal appearance and bowel sounds are normal. He exhibits no distension and no mass. There is no hepatosplenomegaly. There is no tenderness. There is no rigidity, no rebound, no guarding, no CVA tenderness and negative Murphy's sign.  Skin: Rash noted.  Papulonodular rash throughout extremities, some on trunk, excoriations  Psychiatric: He has a normal mood and affect.  Nursing note and vitals reviewed.  Results for orders placed or performed in visit on 07/30/17  Sedimentation rate  Result Value Ref Range   Sed Rate 8 0 - 15 mm/hr  CBC with Differential/Platelet  Result Value Ref Range   WBC 5.2 4.0 - 10.5 K/uL   RBC 4.73 4.22 - 5.81 Mil/uL   Hemoglobin 16.6 13.0 - 17.0 g/dL   HCT 48.8 39.0 - 52.0 %   MCV 103.1 (H) 78.0 - 100.0 fl   MCHC 34.0 30.0 - 36.0 g/dL   RDW 13.8 11.5 - 15.5 %   Platelets 178.0 150.0 - 400.0 K/uL   Neutrophils Relative % 66.4 43.0 - 77.0 %   Lymphocytes Relative 23.5 12.0 - 46.0 %   Monocytes Relative 8.0 3.0 - 12.0 %   Eosinophils Relative 1.5 0.0 - 5.0 %   Basophils Relative 0.6 0.0 - 3.0 %   Neutro Abs 3.4 1.4 - 7.7 K/uL   Lymphs Abs 1.2 0.7 - 4.0 K/uL   Monocytes Absolute 0.4 0.1 - 1.0 K/uL   Eosinophils Absolute 0.1 0.0 - 0.7 K/uL   Basophils Absolute 0.0 0.0 - 0.1 K/uL   Depression screen [facility] 2/9 07/30/2017 06/25/2017 05/28/2017 08/06/2015 06/07/2015  Decreased Interest '1 1 2 ' 0 0  Down, Depressed, Hopeless '1 1 2 ' 0 0  PHQ - 2 Score '2 2 4 ' 0 0  Altered sleeping '2 2 2 1 ' -  Tired, decreased energy '2 2 2 1 ' -  Change in appetite '2 2 1 ' 0 -  Feeling bad or failure about yourself  '1 1 2 ' 0 -  Trouble concentrating '1 1 1 ' 0 -  Moving slowly or fidgety/restless 0 1 1 0 -  Suicidal thoughts 0 0 1 0 -  PHQ-9 Score '10 11 14 2 ' -  Difficult doing work/chores - - - Somewhat difficult -   GAD 7 : Generalized Anxiety Score 07/30/2017 06/25/2017 05/28/2017  08/06/2015  Nervous, Anxious, on Edge '2 3 2 2  ' Control/stop worrying '1 1 2 1  ' Worry too much - different things '1 1 1 1  ' Trouble relaxing '2 2 2 1  ' Restless '1 1 1 1  ' Easily annoyed or irritable '2 2 2 1  ' Afraid - awful might happen  '2 2 3 1  ' Total GAD 7 Score '11 12 13 8      ' Assessment & Plan:   Problem List Items Addressed This Visit    Adjustment disorder with anxiety - Primary    Ongoing struggle. Encouraged continued counseling. Will continue current regimen which pt endorses is beneficial.  PHQ9 = 14 --> 11 --> 10 GAD7 = 13 -->12 --> 11      Macrocytosis    Without anemia. In h/o EtOH use now backed off. rec daily vit B12 and folate x 1 month and recheck next labs.      Skin rash    Recent labs stable. Ongoing trouble. F/u planned with derm with allergy testing next month. Will check CBC, ESR today.       Relevant Orders   Sedimentation rate (Completed)   Weight loss    Unclear cause, in setting of significant stressors over the past year. Check abd [place] given endorsed early satiety.       Relevant Orders   CBC with Differential/Platelet (Completed)   US Abdomen Complete       No orders of the defined types were placed in this encounter.  Orders Placed This Encounter  Procedures  . US Abdomen Complete    Wt 150/ no needs/ ins bcbs SF/ Robin @ office to schedule  Epic order    Standing Status:   Future    Standing Expiration Date:   09/30/2018    Order Specific Question:   Reason for Exam (SYMPTOM  OR DIAGNOSIS REQUIRED)    Answer:   weight loss, diabetic    Order Specific Question:   Preferred imaging location?    Answer:   [facility]  . Sedimentation rate  . CBC with Differential/Platelet    Follow up plan: No follow-ups on file.  Ria Bush, MD

## 2017-07-31 ENCOUNTER — Encounter: Payer: Self-pay | Admitting: Family Medicine

## 2017-07-31 DIAGNOSIS — D7589 Other specified diseases of blood and blood-forming organs: Secondary | ICD-10-CM | POA: Insufficient documentation

## 2017-08-01 DIAGNOSIS — R634 Abnormal weight loss: Secondary | ICD-10-CM | POA: Insufficient documentation

## 2017-08-01 NOTE — Assessment & Plan Note (Addendum)
Without anemia. In h/o EtOH use now backed off. rec daily vit B12 and folate x 1 month and recheck next labs.

## 2017-08-01 NOTE — Assessment & Plan Note (Signed)
Ongoing struggle. Encouraged continued counseling. Will continue current regimen which pt endorses is beneficial.  PHQ9 = 14 --> 11 --> 10 GAD7 = 13 -->12 --> 11

## 2017-08-01 NOTE — Assessment & Plan Note (Signed)
Unclear cause, in setting of significant stressors over the past year. Check abd US given endorsed early satiety.

## 2017-08-01 NOTE — Assessment & Plan Note (Signed)
Recent labs stable. Ongoing trouble. F/u planned with derm with allergy testing next month. Will check CBC, ESR today.

## 2017-08-04 ENCOUNTER — Other Ambulatory Visit: Payer: Self-pay | Admitting: Endocrinology

## 2017-08-07 ENCOUNTER — Ambulatory Visit
Admission: RE | Admit: 2017-08-07 | Discharge: 2017-08-07 | Disposition: A | Discharge status: Home / Self Care | Payer: BLUE CROSS/BLUE SHIELD | Source: Ambulatory Visit | Attending: Family Medicine | Admitting: Family Medicine

## 2017-08-07 DIAGNOSIS — I714 Abdominal aortic aneurysm, without rupture: Secondary | ICD-10-CM | POA: Diagnosis not present

## 2017-08-07 DIAGNOSIS — R634 Abnormal weight loss: Secondary | ICD-10-CM

## 2017-08-11 ENCOUNTER — Encounter: Payer: Self-pay | Admitting: Family Medicine

## 2017-08-11 DIAGNOSIS — I714 Abdominal aortic aneurysm, without rupture: Secondary | ICD-10-CM | POA: Insufficient documentation

## 2017-08-11 DIAGNOSIS — I77811 Abdominal aortic ectasia: Secondary | ICD-10-CM | POA: Insufficient documentation

## 2017-08-20 DIAGNOSIS — L259 Unspecified contact dermatitis, unspecified cause: Secondary | ICD-10-CM | POA: Diagnosis not present

## 2017-08-20 DIAGNOSIS — L299 Pruritus, unspecified: Secondary | ICD-10-CM | POA: Diagnosis not present

## 2017-08-21 ENCOUNTER — Telehealth: Payer: Self-pay | Admitting: Family Medicine

## 2017-08-21 NOTE — Telephone Encounter (Signed)
Copied from CRM [phone]. Topic: Quick Communication - See Telephone Encounter >> Aug 21, 2017 11:51 AM Rudi CocoLathan, Braylei Totino M, NT wrote: CRM for notification. See Telephone encounter for: 08/21/17.  Pt. Calling to receive ultra sound results from 2 weeks ago pt. Stating that he hasn't heard of anything as of yet.

## 2017-08-24 NOTE — Telephone Encounter (Signed)
Spoke with pt relaying results of abd US and message per Dr. Reece AgarG.  Also, notified pt Dr. Reece AgarG sent the results to pt via MyChart on 08/11/17.  Pt verbalizes understanding.

## 2017-09-02 ENCOUNTER — Other Ambulatory Visit: Payer: Self-pay | Admitting: Family Medicine

## 2017-09-02 NOTE — Telephone Encounter (Signed)
Electronic refill request Last office visit 07/30/17  Last refill 09/02/16 #45/3

## 2017-09-07 DIAGNOSIS — L259 Unspecified contact dermatitis, unspecified cause: Secondary | ICD-10-CM | POA: Diagnosis not present

## 2017-09-09 DIAGNOSIS — L299 Pruritus, unspecified: Secondary | ICD-10-CM | POA: Diagnosis not present

## 2017-09-09 DIAGNOSIS — L259 Unspecified contact dermatitis, unspecified cause: Secondary | ICD-10-CM | POA: Diagnosis not present

## 2017-09-10 DIAGNOSIS — L258 Unspecified contact dermatitis due to other agents: Secondary | ICD-10-CM | POA: Diagnosis not present

## 2017-09-10 DIAGNOSIS — L299 Pruritus, unspecified: Secondary | ICD-10-CM | POA: Diagnosis not present

## 2017-09-14 ENCOUNTER — Other Ambulatory Visit (INDEPENDENT_AMBULATORY_CARE_PROVIDER_SITE_OTHER): Payer: BLUE CROSS/BLUE SHIELD

## 2017-09-14 DIAGNOSIS — E1165 Type 2 diabetes mellitus with hyperglycemia: Secondary | ICD-10-CM

## 2017-09-14 LAB — BASIC METABOLIC PANEL
BUN: 14 mg/dL (ref 6–23)
CHLORIDE: 105 meq/L (ref 96–112)
CO2: 27 meq/L (ref 19–32)
Calcium: 9.3 mg/dL (ref 8.4–10.5)
Creatinine, Ser: 0.91 mg/dL (ref 0.40–1.50)
GFR: 94.67 mL/min (ref >60.00)
Glucose, Bld: 137 mg/dL — ABNORMAL HIGH (ref 70–99)
POTASSIUM: 4 meq/L (ref 3.5–5.1)
Sodium: 139 mEq/L (ref 135–145)

## 2017-09-15 LAB — FRUCTOSAMINE: FRUCTOSAMINE: 231 umol/L (ref 0–285)

## 2017-09-16 ENCOUNTER — Encounter: Payer: Self-pay | Admitting: Endocrinology

## 2017-09-16 ENCOUNTER — Ambulatory Visit: Payer: BLUE CROSS/BLUE SHIELD | Admitting: Endocrinology

## 2017-09-16 VITALS — BP 110/68 | HR 74 | Ht 67.0 in | Wt 147.4 lb

## 2017-09-16 DIAGNOSIS — E1165 Type 2 diabetes mellitus with hyperglycemia: Secondary | ICD-10-CM

## 2017-09-16 NOTE — Progress Notes (Signed)
Patient ID: Andrew Hart, male   DOB: 1971/03/06, 47 y.o.   MRN: [id number]           Reason for Appointment: Follow-up for Type 2 Diabetes   History of Present Illness:          Date of diagnosis of type 2 diabetes mellitus: 2007 ?        Background history:  He was diagnosed with routine screening lab work He was initially tried on metformin but this caused diarrhea and he could not continue this Subsequently was treated with glipizide and apparently this was able to control his blood sugars fairly well for a few years Probably in early 2016 he was given Actos in addition to help his glucose control as A1c had gone up to 9.4 This helped transiently and A1c was 7.7 However his A1c had been otherwise over 8% since mid 2015 Because of  high blood sugars on his initial consultation in 05/2014 (A1c was 9.1) he was started on Invokana and Onglyza in addition to his Actos and glipizide.   Recent history:   Non-insulin hypoglycemic drugs the patient is taking are: Glipizide ER 10 mg daily, Actos 30 mg daily and Invokana 300 mg daily, on Januvia   His A1c in 3/19 was 9%, has been as low as 7 previously However his fructosamine is excellent at 231  Current blood sugar patterns and problems identified:  He has been taking Januvia which had previously been stopped because he thought it was causing his rash  He also is continuing his other medications as before  Although he did not bring his monitor for download he says his blood sugars are excellent now with near normal readings before meals and still some high readings after meals but better than before  Not clear how often he checks his blood sugars at home  Lab glucose was 137 after coffee but still fasting  He does not feel hypoglycemic with taking glipizide ER, still taking 10 mg  He does have a little weight loss of about 3 pounds but has cut back on drinking beer completely and probably watching his diet better and eating only  2 proper meals a day  No side effects from [place]  More recently is starting to be more active at work        Side effects from medications have been: Diarrhea from regular metformin  Compliance with the medical regimen: Fair Hypoglycemia: None   Glucose monitoring:  done 0-1  times a d ay         Glucometer:  Accu-Chek    Blood Glucose readings 109-117, pc 169-200   Dietician visit, most recent: 06/2015               Exercise: Usually active at work  Weight history:   Wt Readings from Last 3 Encounters:  09/16/17 147 lb 6.4 oz (66.9 kg)  07/30/17 150 lb (68 kg)  07/08/17 151 lb 9.6 oz (68.8 kg)    Glycemic control:    Lab Results  Component Value Date   HGBA1C 9.0 (H) 07/06/2017   HGBA1C 10.1 (H) 03/06/2017   HGBA1C 7.8 (H) 01/01/2017   Lab Results  Component Value Date   MICROALBUR <0.7 07/06/2017   LDLCALC 98 07/06/2017   CREATININE 0.91 09/14/2017    Lab on 09/14/2017  Component Date Value Ref Range Status  . Fructosamine 09/14/2017 231  0 - 285 umol/L Final   Comment: Published reference interval for apparently healthy  subjects between age 97 and 83 is 77 - 29 umol/L and in a poorly controlled diabetic population is 228 - 563 umol/L with a mean of 396 umol/L.   Marland Kitchen Sodium 09/14/2017 139  135 - 145 mEq/L Final  . Potassium 09/14/2017 4.0  3.5 - 5.1 mEq/L Final  . Chloride 09/14/2017 105  96 - 112 mEq/L Final  . CO2 09/14/2017 27  19 - 32 mEq/L Final  . Glucose, Bld 09/14/2017 137* 70 - 99 mg/dL Final  . BUN 09/14/2017 14  6 - 23 mg/dL Final  . Creatinine, Ser 09/14/2017 0.91  0.40 - 1.50 mg/dL Final  . Calcium 09/14/2017 9.3  8.4 - 10.5 mg/dL Final  . GFR 09/14/2017 94.67  >60.00 mL/min Final      Allergies as of 09/16/2017      Reactions   Janumet Xr [sitagliptin-metformin Hcl Er] Rash   Jardiance [empagliflozin] Rash   Metformin And Related Diarrhea      Medication List        Accurate as of 09/16/17 11:41 AM. Always use your most  recent med list.          ACCU-CHEK GUIDE w/Device Kit 1 kit by Does not apply route 2 (two) times daily. Use to check blood sugar twice a day   acyclovir 200 MG capsule Commonly known as:  ZOVIRAX Take 1 capsule (200 mg total) by mouth 3 (three) times daily. For 3-5 days as needed   aspirin 81 MG tablet Take 81 mg by mouth daily.   BAYER MICROLET LANCETS lancets Use as instructed to check blood sugar twice a day dx E11.65   busPIRone 10 MG tablet Commonly known as:  BUSPAR TAKE 1 TABLET (10 MG TOTAL) BY MOUTH 2 TIMES DAILY.   citalopram 20 MG tablet Commonly known as:  CELEXA Take 1 tablet (20 mg total) by mouth daily.   glipiZIDE 10 MG 24 hr tablet Commonly known as:  GLUCOTROL XL TAKE 1 TABLET (10 MG TOTAL) DAILY WITH BREAKFAST BY MOUTH.   glucose blood test strip Commonly known as:  ACCU-CHEK GUIDE Use to check blood sugars twice daily   INVOKANA 300 MG Tabs tablet Generic drug:  canagliflozin TAKE 1 TABLET (300 MG TOTAL) DAILY BEFORE BREAKFAST BY MOUTH.   pantoprazole 20 MG tablet Commonly known as:  PROTONIX Take 1 tablet (20 mg total) by mouth daily.   pioglitazone 30 MG tablet Commonly known as:  ACTOS TAKE 1 TABLET BY MOUTH DAILY.   simvastatin 80 MG tablet Commonly known as:  ZOCOR TAKE 1 TABLET BY MOUTH DAILY.   sitaGLIPtin 100 MG tablet Commonly known as:  JANUVIA Take 1 tablet (100 mg total) by mouth daily.   traZODone 150 MG tablet Commonly known as:  DESYREL TAKE 1/2 TABLET (75 MG TOTAL) BY MOUTH AT BEDTIME.   triamcinolone cream 0.1 % Commonly known as:  KENALOG Apply to affected area twice daily. Do not apply to face.       Allergies:  Allergies  Allergen Reactions  . Janumet Xr [Sitagliptin-Metformin Hcl Er] Rash  . Jardiance [Empagliflozin] Rash  . Metformin And Related Diarrhea    Past Medical History:  Diagnosis Date  . Alcohol use 11/08/2015  . Diabetes mellitus without complication ([place]) 5462   Bronwen Pendergraft  . GERD  (gastroesophageal reflux disease)   . Herpes labialis   . History of chicken pox   . Hyperlipidemia     Past Surgical History:  Procedure Laterality Date  . APPENDECTOMY    .  FINGER SURGERY Right    2 screws in ring finger  . TONSILLECTOMY      Family History  Problem Relation Age of Onset  . Diabetes Mother   . CAD Father 37       MI at 15, CABG at 53  . Diabetes Maternal Grandmother   . Parkinsonism Mother 40       progressive supranuclear palsy, deceased  . Stroke Neg Hx   . Cancer Neg Hx     Social History:  reports that he has been smoking cigarettes.  He has a 7.50 pack-year smoking history. He has never used smokeless tobacco. He reports that he drinks alcohol. He reports that he has current or past drug history.    Review of Systems    Lipid history:  Has been on 80 mg simvastatin for treatment, prescribed by PCP  His lipids are well controlled    Lab Results  Component Value Date   CHOL 169 07/06/2017   HDL 44.60 07/06/2017   LDLCALC 98 07/06/2017   TRIG 133.0 07/06/2017   CHOLHDL 4 07/06/2017            Most recent foot exam: 5/18    Review of Systems     Physical Examination:  BP 110/68 (BP Location: Left Arm, Patient Position: Sitting, Cuff Size: Normal)   Pulse 74   Ht '5\' 7"'  (1.702 m)   Wt 147 lb 6.4 oz (66.9 kg)   SpO2 98%   BMI 23.09 kg/m    ASSESSMENT:  Diabetes type 2, non-insulin-dependent  See history of present illness for detailed discussion of current diabetes management, blood sugar patterns and problems identified  His fructosamine of 231 indicates marked improvement in his blood sugars With adding Januvia only and continuing other medications his blood sugars are appearing to be much better He is on his own cut back on his total calorie intake and eliminating any alcohol also  Skin rash: He is still getting evaluated by his dermatologist for allergies  PLAN:    He will continue his regimen unchanged He does need  to bring his meter for download on each visit May consider reducing Invokana if his weight loss continues unexpectedly but since he still has a relatively high readings after meals reportedly will not change it as yet Also if his blood sugars are low normal he can call and we can reduce his glipizide down to 5 mg   There are no Patient Instructions on file for this visit.   Elayne Snare 09/16/2017, 11:41 AM   Note: This office note was prepared with Dragon voice recognition system technology. Any transcriptional errors that result from this process are unintentional.

## 2017-09-18 ENCOUNTER — Other Ambulatory Visit: Payer: Self-pay | Admitting: Family Medicine

## 2017-09-25 ENCOUNTER — Other Ambulatory Visit: Payer: Self-pay | Admitting: Family Medicine

## 2017-10-20 ENCOUNTER — Telehealth: Payer: Self-pay | Admitting: Endocrinology

## 2017-10-20 NOTE — Telephone Encounter (Signed)
Let us try to cut Invokana in half (150 mg daily) and if sugars persist in being very good, after 2 weeks, he can also start Januvia.  Invokana is known to cause weight loss and Januvia is usually weight neutral, but can decrease his appetite.  When he runs out of 300 mg Invokana, we can send the 100 mg tablet to his pharmacy.

## 2017-10-20 NOTE — Telephone Encounter (Signed)
Pt called and given MD message. Pt verbalized understanding.

## 2017-10-20 NOTE — Telephone Encounter (Signed)
Patient stated that he is losing to much weight, he would like a call from the office.  Called the [facility]

## 2017-10-20 NOTE — Telephone Encounter (Signed)
Spoke with pt and he states that on 10/19/17 he weighed on his home scale and it was noted to be 139.8lb. His last weight during his last office visit on 09/16/17, in which his weight was noted to be 147.4. He has had no side effects other than weight loss that he feels is a rapid weight loss that the MD may be concerned about. Pt stated that all his blood sugars have been very good, despite being placed on Prednisone for a chronic rash by his dermatologist. Pt was advised that the message would be forwarded to an MD, and Dr. Lucianne MussKumar was out of the office until Monday of next week. Pt verbalized understanding.  Could you please advise in the absence of Dr. Lucianne MussKumar?  Thank you.

## 2017-11-07 ENCOUNTER — Other Ambulatory Visit: Payer: Self-pay | Admitting: Family Medicine

## 2017-11-07 ENCOUNTER — Other Ambulatory Visit: Payer: Self-pay | Admitting: Endocrinology

## 2017-11-09 NOTE — Telephone Encounter (Signed)
Ok to refill?   Last prescribed on 06/25/2017  Last seen on 07/30/2017

## 2017-12-02 DIAGNOSIS — L299 Pruritus, unspecified: Secondary | ICD-10-CM | POA: Diagnosis not present

## 2017-12-02 DIAGNOSIS — Z79899 Other long term (current) drug therapy: Secondary | ICD-10-CM | POA: Diagnosis not present

## 2017-12-02 DIAGNOSIS — Z5181 Encounter for therapeutic drug level monitoring: Secondary | ICD-10-CM | POA: Diagnosis not present

## 2017-12-02 DIAGNOSIS — L259 Unspecified contact dermatitis, unspecified cause: Secondary | ICD-10-CM | POA: Diagnosis not present

## 2017-12-10 ENCOUNTER — Other Ambulatory Visit: Payer: Self-pay | Admitting: Endocrinology

## 2017-12-10 ENCOUNTER — Other Ambulatory Visit: Payer: Self-pay | Admitting: Family Medicine

## 2017-12-14 ENCOUNTER — Other Ambulatory Visit (INDEPENDENT_AMBULATORY_CARE_PROVIDER_SITE_OTHER): Payer: BLUE CROSS/BLUE SHIELD

## 2017-12-14 DIAGNOSIS — E1165 Type 2 diabetes mellitus with hyperglycemia: Secondary | ICD-10-CM

## 2017-12-14 LAB — BASIC METABOLIC PANEL
BUN: 20 mg/dL (ref 6–23)
CO2: 28 mEq/L (ref 19–32)
Calcium: 9.2 mg/dL (ref 8.4–10.5)
Chloride: 104 mEq/L (ref 96–112)
Creatinine, Ser: 1.11 mg/dL (ref 0.40–1.50)
GFR: 75.19 mL/min (ref >60.00)
GLUCOSE: 123 mg/dL — AB (ref 70–99)
POTASSIUM: 4 meq/L (ref 3.5–5.1)
Sodium: 138 mEq/L (ref 135–145)

## 2017-12-14 LAB — HEMOGLOBIN A1C: Hgb A1c MFr Bld: 8.2 % — ABNORMAL HIGH (ref 4.6–6.5)

## 2017-12-18 ENCOUNTER — Ambulatory Visit: Payer: BLUE CROSS/BLUE SHIELD | Admitting: Endocrinology

## 2017-12-18 ENCOUNTER — Encounter: Payer: Self-pay | Admitting: Endocrinology

## 2017-12-18 VITALS — BP 118/80 | HR 77 | Ht 67.0 in | Wt 146.0 lb

## 2017-12-18 DIAGNOSIS — E1165 Type 2 diabetes mellitus with hyperglycemia: Secondary | ICD-10-CM | POA: Diagnosis not present

## 2017-12-18 NOTE — Progress Notes (Signed)
Patient ID: Andrew Hart, male   DOB: April 29, 1971, 47 y.o.   MRN: [id number]           Reason for Appointment: Follow-up for Type 2 Diabetes   History of Present Illness:          Date of diagnosis of type 2 diabetes mellitus: 2007 ?        Background history:  He was diagnosed with routine screening lab work He was initially tried on metformin but this caused diarrhea and he could not continue this Subsequently was treated with glipizide and apparently this was able to control his blood sugars fairly well for a few years Probably in early 2016 he was given Actos in addition to help his glucose control as A1c had gone up to 9.4 This helped transiently and A1c was 7.7 However his A1c had been otherwise over 8% since mid 2015 Because of  high blood sugars on his initial consultation in 05/2014 (A1c was 9.1) he was started on Invokana and Onglyza in addition to his Actos and glipizide.   Recent history:   Non-insulin hypoglycemic drugs the patient is taking are: Glipizide ER 10 mg daily, Actos 30 mg daily and Invokana 300 mg daily, Januvia   His A1c in 3/19 was 9%, it is now 8.2 Has been as low as 7 previously  Current blood sugar patterns and problems identified:  He has been on prednisone in May/June for his rash and he thinks his blood sugars went up over 200 with this  However his A1c is still higher than it has been historically  With taking Januvia since earlier in 2019 he has generally had better control  Recently appears to have readings around 190 postprandially at night but not checking enough  Was also getting relatively high readings postprandially on his last visit  He has been concerned about progressive weight loss and he thinks this has been from [place], apparently in June his weight was down to about 140 pounds and his Invokana was reduced to half a tablet  His weight has come back up to baseline  He has been to the dietitian previously and he does not want  to review his meal planning again, previously has had some inconsistent intake of carbohydrates, eating out and some alcohol intake also  Blood sugars in the morning and early afternoon are fairly close to normal and lab glucose was 123 fasting        Side effects from medications have been: Diarrhea from regular metformin  Compliance with the medical regimen: Fair Hypoglycemia: None   Glucose monitoring:  done 0-1  times a d ay         Glucometer:  Accu-Chek   Blood Glucose readings from download show only 7 readings  After evening meal 187, 192 and 193 Afternoon or fasting 93-115  Dietician visit, most recent: 06/2015               Exercise: Usually active at work  Weight history:   Wt Readings from Last 3 Encounters:  12/18/17 146 lb (66.2 kg)  09/16/17 147 lb 6.4 oz (66.9 kg)  07/30/17 150 lb (68 kg)    Glycemic control:    Lab Results  Component Value Date   HGBA1C 8.2 (H) 12/14/2017   HGBA1C 9.0 (H) 07/06/2017   HGBA1C 10.1 (H) 03/06/2017   Lab Results  Component Value Date   MICROALBUR <0.7 07/06/2017   LDLCALC 98 07/06/2017   CREATININE 1.11 12/14/2017  Lab on 12/14/2017  Component Date Value Ref Range Status  . Sodium 12/14/2017 138  135 - 145 mEq/L Final  . Potassium 12/14/2017 4.0  3.5 - 5.1 mEq/L Final  . Chloride 12/14/2017 104  96 - 112 mEq/L Final  . CO2 12/14/2017 28  19 - 32 mEq/L Final  . Glucose, Bld 12/14/2017 123* 70 - 99 mg/dL Final  . BUN 12/14/2017 20  6 - 23 mg/dL Final  . Creatinine, Ser 12/14/2017 1.11  0.40 - 1.50 mg/dL Final  . Calcium 12/14/2017 9.2  8.4 - 10.5 mg/dL Final  . GFR 12/14/2017 75.19  >60.00 mL/min Final  . Hgb A1c MFr Bld 12/14/2017 8.2* 4.6 - 6.5 % Final   Glycemic Control Guidelines for People with Diabetes:Non Diabetic:  <6%Goal of Therapy: <7%Additional Action Suggested:  >8%       Allergies as of 12/18/2017      Reactions   Janumet Xr [sitagliptin-metformin Hcl Er] Rash   Jardiance [empagliflozin] Rash     Metformin And Related Diarrhea      Medication List        Accurate as of 12/18/17  9:51 AM. Always use your most recent med list.          ACCU-CHEK GUIDE w/Device Kit 1 kit by Does not apply route 2 (two) times daily. Use to check blood sugar twice a day   acyclovir 200 MG capsule Commonly known as:  ZOVIRAX Take 1 capsule (200 mg total) by mouth 3 (three) times daily. For 3-5 days as needed   aspirin 81 MG tablet Take 81 mg by mouth daily.   BAYER MICROLET LANCETS lancets Use as instructed to check blood sugar twice a day dx E11.65   busPIRone 10 MG tablet Commonly known as:  BUSPAR TAKE 1 TABLET BY MOUTH 2 TIMES DAILY.   citalopram 20 MG tablet Commonly known as:  CELEXA TAKE 1 TABLET BY MOUTH DAILY.   doxycycline 100 MG tablet Commonly known as:  VIBRA-TABS Take 100 mg by mouth 2 (two) times daily.   folic acid 1 MG tablet Commonly known as:  FOLVITE Take 1 mg by mouth daily.   glipiZIDE 10 MG 24 hr tablet Commonly known as:  GLUCOTROL XL TAKE 1 TABLET (10 MG TOTAL) DAILY WITH BREAKFAST BY MOUTH.   glucose blood test strip Use to check blood sugars twice daily   INVOKANA 300 MG Tabs tablet Generic drug:  canagliflozin TAKE 1 TABLET (300 MG TOTAL) DAILY BEFORE BREAKFAST BY MOUTH.   methotrexate 5 MG tablet Commonly known as:  RHEUMATREX Take 5 mg by mouth once a week. Caution: Chemotherapy. Protect from light.   pantoprazole 20 MG tablet Commonly known as:  PROTONIX TAKE 1 TABLET (20 MG TOTAL) BY MOUTH DAILY.   pioglitazone 30 MG tablet Commonly known as:  ACTOS TAKE 1 TABLET BY MOUTH DAILY.   simvastatin 80 MG tablet Commonly known as:  ZOCOR TAKE 1 TABLET BY MOUTH DAILY.   sitaGLIPtin 100 MG tablet Commonly known as:  JANUVIA Take 1 tablet (100 mg total) by mouth daily.   traZODone 150 MG tablet Commonly known as:  DESYREL TAKE 1/2 TABLET (75 MG TOTAL) BY MOUTH AT BEDTIME.   triamcinolone cream 0.1 % Commonly known as:   KENALOG Apply to affected area twice daily. Do not apply to face.       Allergies:  Allergies  Allergen Reactions  . Janumet Xr [Sitagliptin-Metformin Hcl Er] Rash  . Jardiance [Empagliflozin] Rash  . Metformin And  Related Diarrhea    Past Medical History:  Diagnosis Date  . Alcohol use 11/08/2015  . Diabetes mellitus without complication ([place]) 1779   Raelea Gosse  . GERD (gastroesophageal reflux disease)   . Herpes labialis   . History of chicken pox   . Hyperlipidemia     Past Surgical History:  Procedure Laterality Date  . APPENDECTOMY    . FINGER SURGERY Right    2 screws in ring finger  . TONSILLECTOMY      Family History  Problem Relation Age of Onset  . Diabetes Mother   . CAD Father 3       MI at 52, CABG at 59  . Diabetes Maternal Grandmother   . Parkinsonism Mother 65       progressive supranuclear palsy, deceased  . Stroke Neg Hx   . Cancer Neg Hx     Social History:  reports that he has been smoking cigarettes. He has a 7.50 pack-year smoking history. He has never used smokeless tobacco. He reports that he drinks alcohol. He reports that he has current or past drug history.    Review of Systems    Lipid history:  Has been on 80 mg simvastatin for treatment, prescribed by PCP  His lipids are well controlled    Lab Results  Component Value Date   CHOL 169 07/06/2017   HDL 44.60 07/06/2017   LDLCALC 98 07/06/2017   TRIG 133.0 07/06/2017   CHOLHDL 4 07/06/2017            Most recent foot exam: 5/18    Review of Systems     Physical Examination:  BP 118/80 (BP Location: Left Arm, Patient Position: Sitting, Cuff Size: Normal)   Pulse 77   Ht '5\' 7"'  (1.702 m)   Wt 146 lb (66.2 kg)   SpO2 97%   BMI 22.87 kg/m    ASSESSMENT:  Diabetes type 2, non-insulin-dependent  See history of present illness for detailed discussion of current diabetes management, blood sugar patterns and problems identified  His A1c is still high at 8.2  He  continues to have higher postprandial readings at night This may be from inconsistent diet but also likely not getting enough efficacy of his current medications including Januvia and 150 mg Invokana Previously had progressive weight loss with 300 mg Invokana  PLAN:    Discussed that he is having increased deficiency of insulin causing postprandial hyperglycemia He does not want to see the dietitian which may be desirable because of high postprandial readings Also discussed that he should be using a medication more effective than Januvia such as a GLP-1 drug Discussed in detail actions of GLP-1 drugs and benefits and showed him how the Trulicity pen would work which would be the simplest device However he is reluctant to change at this time  Has been medically not able to get his sugars consistently controlled and A1c down another 1% will need to switch to Trulicity  Continue current medications unchanged in the meantime  There are no Patient Instructions on file for this visit.   Andrew Hart 12/18/2017, 9:51 AM   Note: This office note was prepared with Dragon voice recognition system technology. Any transcriptional errors that result from this process are unintentional.

## 2017-12-18 NOTE — Patient Instructions (Addendum)
Check blood sugars on waking up  2-3/7 days  Also check blood sugars about 2 hours after a meal and do this after different meals by rotation daily  Recommended blood sugar levels on waking up is 90-130 and about 2 hours after meal is 130-160  Please bring your blood sugar monitor to each visit, thank you  Low carbs at supper

## 2018-01-07 ENCOUNTER — Other Ambulatory Visit: Payer: Self-pay | Admitting: Endocrinology

## 2018-01-08 DIAGNOSIS — L299 Pruritus, unspecified: Secondary | ICD-10-CM | POA: Diagnosis not present

## 2018-01-08 DIAGNOSIS — L259 Unspecified contact dermatitis, unspecified cause: Secondary | ICD-10-CM | POA: Diagnosis not present

## 2018-01-08 DIAGNOSIS — Z5181 Encounter for therapeutic drug level monitoring: Secondary | ICD-10-CM | POA: Diagnosis not present

## 2018-01-08 DIAGNOSIS — L281 Prurigo nodularis: Secondary | ICD-10-CM | POA: Diagnosis not present

## 2018-01-18 ENCOUNTER — Encounter: Payer: Self-pay | Admitting: Family Medicine

## 2018-01-18 ENCOUNTER — Ambulatory Visit (INDEPENDENT_AMBULATORY_CARE_PROVIDER_SITE_OTHER): Payer: BLUE CROSS/BLUE SHIELD | Admitting: Family Medicine

## 2018-01-18 VITALS — BP 118/68 | HR 56 | Temp 98.2°F | Ht 66.5 in | Wt 141.8 lb

## 2018-01-18 DIAGNOSIS — F17200 Nicotine dependence, unspecified, uncomplicated: Secondary | ICD-10-CM

## 2018-01-18 DIAGNOSIS — I714 Abdominal aortic aneurysm, without rupture, unspecified: Secondary | ICD-10-CM

## 2018-01-18 DIAGNOSIS — E1165 Type 2 diabetes mellitus with hyperglycemia: Secondary | ICD-10-CM | POA: Diagnosis not present

## 2018-01-18 DIAGNOSIS — Z0000 Encounter for general adult medical examination without abnormal findings: Secondary | ICD-10-CM

## 2018-01-18 DIAGNOSIS — Z23 Encounter for immunization: Secondary | ICD-10-CM

## 2018-01-18 DIAGNOSIS — G4700 Insomnia, unspecified: Secondary | ICD-10-CM

## 2018-01-18 DIAGNOSIS — Z87898 Personal history of other specified conditions: Secondary | ICD-10-CM

## 2018-01-18 DIAGNOSIS — E785 Hyperlipidemia, unspecified: Secondary | ICD-10-CM

## 2018-01-18 DIAGNOSIS — R634 Abnormal weight loss: Secondary | ICD-10-CM

## 2018-01-18 DIAGNOSIS — R21 Rash and other nonspecific skin eruption: Secondary | ICD-10-CM

## 2018-01-18 DIAGNOSIS — F4322 Adjustment disorder with anxiety: Secondary | ICD-10-CM

## 2018-01-18 DIAGNOSIS — Z8249 Family history of ischemic heart disease and other diseases of the circulatory system: Secondary | ICD-10-CM

## 2018-01-18 LAB — POC URINALSYSI DIPSTICK (AUTOMATED)
Bilirubin, UA: NEGATIVE
Blood, UA: NEGATIVE
GLUCOSE UA: POSITIVE — AB
Ketones, UA: NEGATIVE
LEUKOCYTES UA: NEGATIVE
NITRITE UA: NEGATIVE
Protein, UA: NEGATIVE
Spec Grav, UA: >=1.03 — AB (ref 1.010–1.025)
Urobilinogen, UA: 0.2 E.U./dL
pH, UA: 6 (ref 5.0–8.0)

## 2018-01-18 NOTE — Addendum Note (Signed)
Addended by: Nanci PinaGOINS, Derry Arbogast on: 01/18/2018 10:55 AM   Modules accepted: Orders

## 2018-01-18 NOTE — Assessment & Plan Note (Addendum)
Followed by derm - contact dermatitis with atopy. On MTX and folate. Also uses topical steroid cream. Just completed oral prednisone taper and doxy course.

## 2018-01-18 NOTE — Assessment & Plan Note (Signed)
Endorses better sugar readings at home. Latest A1c was in setting of prednisone use (for skin). Appreciate endo care.

## 2018-01-18 NOTE — Progress Notes (Signed)
BP 118/68 (BP Location: Left Arm, Patient Position: Sitting, Cuff Size: Normal)   Pulse (!) 56   Temp 98.2 F (36.8 C) (Oral)   Ht 5' 6.5" (1.689 m)   Wt 141 lb 12 oz (64.3 kg)   SpO2 97%   BMI 22.54 kg/m    CC: CPE Subjective:    Patient ID: Andrew Hart, male    DOB: [birth year], 47 y.o.   MRN: [id number]  HPI: Andrew Hart is a 47 y.o. male presenting on 01/18/2018 for Annual Exam   Skin rash - followed by [facility] latest thought atopic contact dermatitis. Allergic to preservative in lotion/shampoo - Methyldibromo Gluteronitrile. On MTX once weekly for this, has started limited tanning in tanning bed, skin stable. He did recently complete 30d prednisone taper as well as doxycycline course.   40 lb weight loss over the past 2 years, not trying - attributed to invokana.  Abd US - pancreas was not well visualized.  AAA by [place] - 3.6cm - discussed will need yearly ultrasound.  DM - well controlled at home cbg's.  Preventative: Flu shot yearly Pneumovax 2016 Tdap 2014 Seat belt use discussed Sunscreen use discussed. No changing moles on skin. Smoking - 1/2 ppd x 17 yrs Alcohol - stopped since 08/16/2017 - has rededicated himself to the [employer] - stopped since 08/16/2017 Dentist Q6 mo Eye exam - yearly  Lives with wife and 2 children (daughter 2003, son 2006)  Occ: owns Education administrator business  Activity: active at work, at home on yard  Diet: some water, fruits/vegetables daily, lots of soda and gatorade  Relevant past medical, surgical, family and social history reviewed and updated as indicated. Interim medical history since our last visit reviewed. Allergies and medications reviewed and updated. Outpatient Medications Prior to Visit  Medication Sig Dispense Refill  . acyclovir (ZOVIRAX) 200 MG capsule Take 1 capsule (200 mg total) by mouth 3 (three) times daily. For 3-5 days as needed 30 capsule 3  . aspirin 81 MG tablet Take 81 mg by mouth daily.    Marland Kitchen BAYER MICROLET  LANCETS lancets Use as instructed to check blood sugar twice a day dx E11.65 100 each 3  . Blood Glucose Monitoring Suppl (ACCU-CHEK GUIDE) w/Device KIT 1 kit by Does not apply route 2 (two) times daily. Use to check blood sugar twice a day 1 kit 2  . busPIRone (BUSPAR) 10 MG tablet TAKE 1 TABLET BY MOUTH 2 TIMES DAILY. 180 tablet 1  . citalopram (CELEXA) 20 MG tablet TAKE 1 TABLET BY MOUTH DAILY. 30 tablet 6  . folic acid (FOLVITE) 1 MG tablet Take 1 mg by mouth daily.    Marland Kitchen glipiZIDE (GLUCOTROL XL) 10 MG 24 hr tablet TAKE 1 TABLET (10 MG TOTAL) DAILY WITH BREAKFAST BY MOUTH. 30 tablet 3  . glucose blood (ACCU-CHEK GUIDE) test strip Use to check blood sugars twice daily 100 each 3  . INVOKANA 300 MG TABS tablet TAKE 1 TABLET DAILY BEFORE BREAKFAST BY MOUTH. (Patient taking differently: Takes 1/2 tablet daily) 30 tablet 3  . methotrexate (RHEUMATREX) 5 MG tablet Take 5 mg by mouth once a week. Caution: Chemotherapy. Protect from light.    . pantoprazole (PROTONIX) 20 MG tablet TAKE 1 TABLET (20 MG TOTAL) BY MOUTH DAILY. 30 tablet 3  . pioglitazone (ACTOS) 30 MG tablet TAKE 1 TABLET BY MOUTH DAILY. 30 tablet 3  . simvastatin (ZOCOR) 80 MG tablet TAKE 1 TABLET BY MOUTH DAILY. 30 tablet 4  .  sitaGLIPtin (JANUVIA) 100 MG tablet Take 1 tablet (100 mg total) by mouth daily. 30 tablet 3  . traZODone (DESYREL) 150 MG tablet TAKE 1/2 TABLET (75 MG TOTAL) BY MOUTH AT BEDTIME. 45 tablet 3  . triamcinolone cream (KENALOG) 0.1 % Apply to affected area twice daily. Do not apply to face.    . doxycycline (VIBRA-TABS) 100 MG tablet Take 100 mg by mouth 2 (two) times daily.    . hydrOXYzine (ATARAX/VISTARIL) 25 MG tablet Take 1 tablet (25 mg total) by mouth 3 (three) times daily as needed.  0   No facility-administered medications prior to visit.      Per HPI unless specifically indicated in ROS section below Review of Systems  Constitutional: Positive for unexpected weight change (dropping, has stabilized  since decreasing invokana). Negative for activity change, appetite change (healthy appetite), chills, fatigue and fever.  HENT: Negative for hearing loss.   Eyes: Negative for visual disturbance.  Respiratory: Positive for cough. Negative for chest tightness, shortness of breath and wheezing.   Cardiovascular: Negative for chest pain, palpitations and leg swelling.  Gastrointestinal: Negative for abdominal distention, abdominal pain, blood in stool, constipation, diarrhea, nausea and vomiting.  Genitourinary: Negative for difficulty urinating and hematuria.  Musculoskeletal: Negative for arthralgias, myalgias and neck pain.  Skin: Negative for rash.  Neurological: Negative for dizziness, seizures, syncope and headaches.  Hematological: Negative for adenopathy. Bruises/bleeds easily.  Psychiatric/Behavioral: Negative for dysphoric mood. The patient is not nervous/anxious.        Objective:    BP 118/68 (BP Location: Left Arm, Patient Position: Sitting, Cuff Size: Normal)   Pulse (!) 56   Temp 98.2 F (36.8 C) (Oral)   Ht 5' 6.5" (1.689 m)   Wt 141 lb 12 oz (64.3 kg)   SpO2 97%   BMI 22.54 kg/m   Wt Readings from Last 3 Encounters:  01/18/18 141 lb 12 oz (64.3 kg)  12/18/17 146 lb (66.2 kg)  09/16/17 147 lb 6.4 oz (66.9 kg)    Physical Exam  Constitutional: He is oriented to person, place, and time. He appears well-developed and well-nourished. No distress.  HENT:  Head: Normocephalic and atraumatic.  Right Ear: Hearing, tympanic membrane, external ear and ear canal normal.  Left Ear: Hearing, tympanic membrane, external ear and ear canal normal.  Nose: Nose normal.  Mouth/Throat: Uvula is midline, oropharynx is clear and moist and mucous membranes are normal. No oropharyngeal exudate, posterior oropharyngeal edema or posterior oropharyngeal erythema.  Eyes: Pupils are equal, round, and reactive to light. Conjunctivae and EOM are normal. No scleral icterus.  Neck: Normal range  of motion. Neck supple. No thyromegaly present.  Cardiovascular: Normal rate, regular rhythm, normal heart sounds and intact distal pulses.  No murmur heard. Pulses:      Radial pulses are 2+ on the right side, and 2+ on the left side.  Pulmonary/Chest: Effort normal and breath sounds normal. No respiratory distress. He has no wheezes. He has no rales.  Abdominal: Soft. Bowel sounds are normal. He exhibits no distension and no mass. There is no tenderness. There is no rebound and no guarding.  Musculoskeletal: Normal range of motion. He exhibits no edema.  Lymphadenopathy:    He has no cervical adenopathy.  Neurological: He is alert and oriented to person, place, and time.  CN grossly intact, station and gait intact  Skin: Skin is warm and dry. No rash noted.  Psychiatric: He has a normal mood and affect. His behavior is normal. Judgment  and thought content normal.  Nursing note and vitals reviewed.  Results for orders placed or performed in visit on 25/42/70  Basic metabolic panel  Result Value Ref Range   Sodium 138 135 - 145 mEq/L   Potassium 4.0 3.5 - 5.1 mEq/L   Chloride 104 96 - 112 mEq/L   CO2 28 19 - 32 mEq/L   Glucose, Bld 123 (H) 70 - 99 mg/dL   BUN 20 6 - 23 mg/dL   Creatinine, Ser 1.11 0.40 - 1.50 mg/dL   Calcium 9.2 8.4 - 10.5 mg/dL   GFR 75.19 >60.00 mL/min  Hemoglobin A1c  Result Value Ref Range   Hgb A1c MFr Bld 8.2 (H) 4.6 - 6.5 %      Assessment & Plan:   Problem List Items Addressed This Visit    Weight loss    Weight seems to have stabilized, in setting of decreased invokana dosing. He did have significant stressors over the past year. Healthy changes since 08/2017 - renewed spirituality. Check iFOB, UA today r/o occult blood loss. Abd [place] unrevealing pancreas. Would consider contrasted abd CT to eval pancreas in setting of weight loss and difficult to control DM despite low weight.      Relevant Orders   Fecal occult blood, imunochemical   Uncontrolled  type 2 diabetes mellitus with hyperglycemia, without long-term current use of insulin (HCC)    Endorses better sugar readings at home. Latest A1c was in setting of prednisone use (for skin). Appreciate endo care.       Smoker    Down to 1/2 ppd. He is contemplative.       Skin rash    Followed by derm - contact dermatitis with atopy. On MTX and folate. Also uses topical steroid cream. Just completed oral prednisone taper and doxy course.      Insomnia    Continue trazodone 26m daily.      Hyperlipidemia    Chronic, stable. Continue simvastatin.  The 10-year ASCVD risk score (Mikey BussingDC JBrooke Bonito, et al., 2013) is: 9%   Values used to calculate the score:     Age: 89years     Sex: Male     Is Non-Hispanic African American: No     Diabetic: Yes     Tobacco smoker: Yes     Systolic Blood Pressure: 1623mmHg     Is BP treated: No     HDL Cholesterol: 44.6 mg/dL     Total Cholesterol: 169 mg/dL       History of alcohol use    Fully quit 08/2017.      Health maintenance examination - Primary    Preventative protocols reviewed and updated unless pt declined. Discussed healthy diet and lifestyle.       Family history of premature CAD   Adjustment disorder with anxiety    Stable period. Continue celexa, buspar, trazodone.       AAA (abdominal aortic aneurysm) without rupture ([place]    rec yearly AAA [place] next due 08/2018       Other Visit Diagnoses    Need for influenza vaccination       Relevant Orders   Flu Vaccine QUAD 36+ mos IM (Completed)       No orders of the defined types were placed in this encounter.  Orders Placed This Encounter  Procedures  . Fecal occult blood, imunochemical    Standing Status:   Future    Standing Expiration Date:   01/19/2019  .  Flu Vaccine QUAD 36+ mos IM    Follow up plan: Return in about 1 year (around 01/19/2019) for annual exam, prior fasting for blood work.  Ria Bush, MD

## 2018-01-18 NOTE — Assessment & Plan Note (Signed)
Continue trazodone 75mg  daily.

## 2018-01-18 NOTE — Assessment & Plan Note (Signed)
Weight seems to have stabilized, in setting of decreased invokana dosing. He did have significant stressors over the past year. Healthy changes since 08/2017 - renewed spirituality. Check iFOB, UA today r/o occult blood loss. Abd US unrevealing pancreas. Would consider contrasted abd CT to eval pancreas in setting of weight loss and difficult to control DM despite low weight.

## 2018-01-18 NOTE — Assessment & Plan Note (Signed)
Stable period. Continue celexa, buspar, trazodone.

## 2018-01-18 NOTE — Assessment & Plan Note (Signed)
rec yearly AAA US, next due 08/2018

## 2018-01-18 NOTE — Assessment & Plan Note (Addendum)
Chronic, stable. Continue simvastatin.  The 10-year ASCVD risk score Denman George(Goff DC Montez HagemanJr., et al., 2013) is: 9%   Values used to calculate the score:     Age: 47 years     Sex: Male     Is Non-Hispanic African American: No     Diabetic: Yes     Tobacco smoker: Yes     Systolic Blood Pressure: 118 mmHg     Is BP treated: No     HDL Cholesterol: 44.6 mg/dL     Total Cholesterol: 169 mg/dL

## 2018-01-18 NOTE — Assessment & Plan Note (Signed)
Preventative protocols reviewed and updated unless pt declined. Discussed healthy diet and lifestyle.  

## 2018-01-18 NOTE — Assessment & Plan Note (Signed)
Fully quit 08/2017.

## 2018-01-18 NOTE — Patient Instructions (Addendum)
Flu shot today Urinalysis today. Pass by lab to pick up stool kit. You are doing well.  Return as needed or in 1 year for next physical.  Health Maintenance, Male A healthy lifestyle and preventive care is important for your health and wellness. Ask your health care provider about what schedule of regular examinations is right for you. What should I know about weight and diet? Eat a Healthy Diet  Eat plenty of vegetables, fruits, whole grains, low-fat dairy products, and lean protein.  Do not eat a lot of foods high in solid fats, added sugars, or salt.  Maintain a Healthy Weight Regular exercise can help you achieve or maintain a healthy weight. You should:  Do at least 150 minutes of exercise each week. The exercise should increase your heart rate and make you sweat (moderate-intensity exercise).  Do strength-training exercises at least twice a week.  Watch Your Levels of Cholesterol and Blood Lipids  Have your blood tested for lipids and cholesterol every 5 years starting at 48 years of age. If you are at high risk for heart disease, you should start having your blood tested when you are 48 years old. You may need to have your cholesterol levels checked more often if: ? Your lipid or cholesterol levels are high. ? You are older than 48 years of age. ? You are at high risk for heart disease.  What should I know about cancer screening? Many types of cancers can be detected early and may often be prevented. Lung Cancer  You should be screened every year for lung cancer if: ? You are a current smoker who has smoked for at least 30 years. ? You are a former smoker who has quit within the past 15 years.  Talk to your health care provider about your screening options, when you should start screening, and how often you should be screened.  Colorectal Cancer  Routine colorectal cancer screening usually begins at 48 years of age and should be repeated every 5-10 years until you are  48 years old. You may need to be screened more often if early forms of precancerous polyps or small growths are found. Your health care provider may recommend screening at an earlier age if you have risk factors for colon cancer.  Your health care provider may recommend using home test kits to check for hidden blood in the stool.  A small camera at the end of a tube can be used to examine your colon (sigmoidoscopy or colonoscopy). This checks for the earliest forms of colorectal cancer.  Prostate and Testicular Cancer  Depending on your age and overall health, your health care provider may do certain tests to screen for prostate and testicular cancer.  Talk to your health care provider about any symptoms or concerns you have about testicular or prostate cancer.  Skin Cancer  Check your skin from head to toe regularly.  Tell your health care provider about any new moles or changes in moles, especially if: ? There is a change in a mole's size, shape, or color. ? You have a mole that is larger than a pencil eraser.  Always use sunscreen. Apply sunscreen liberally and repeat throughout the day.  Protect yourself by wearing long sleeves, pants, a wide-brimmed hat, and sunglasses when outside.  What should I know about heart disease, diabetes, and high blood pressure?  If you are 73-29 years of age, have your blood pressure checked every 3-5 years. If you are 40 years of  age or older, have your blood pressure checked every year. You should have your blood pressure measured twice-once when you are at a hospital or clinic, and once when you are not at a hospital or clinic. Record the average of the two measurements. To check your blood pressure when you are not at a hospital or clinic, you can use: ? An automated blood pressure machine at a pharmacy. ? A home blood pressure monitor.  Talk to your health care provider about your target blood pressure.  If you are between 45-79 years old, ask  your health care provider if you should take aspirin to prevent heart disease.  Have regular diabetes screenings by checking your fasting blood sugar level. ? If you are at a normal weight and have a low risk for diabetes, have this test once every three years after the age of 45. ? If you are overweight and have a high risk for diabetes, consider being tested at a younger age or more often.  A one-time screening for abdominal aortic aneurysm (AAA) by ultrasound is recommended for men aged 65-75 years who are current or former smokers. What should I know about preventing infection? Hepatitis B If you have a higher risk for hepatitis B, you should be screened for this virus. Talk with your health care provider to find out if you are at risk for hepatitis B infection. Hepatitis C Blood testing is recommended for:  Everyone born from 1945 through 1965.  Anyone with known risk factors for hepatitis C.  Sexually Transmitted Diseases (STDs)  You should be screened each year for STDs including gonorrhea and chlamydia if: ? You are sexually active and are younger than 48 years of age. ? You are older than 48 years of age and your health care provider tells you that you are at risk for this type of infection. ? Your sexual activity has changed since you were last screened and you are at an increased risk for chlamydia or gonorrhea. Ask your health care provider if you are at risk.  Talk with your health care provider about whether you are at high risk of being infected with HIV. Your health care provider may recommend a prescription medicine to help prevent HIV infection.  What else can I do?  Schedule regular health, dental, and eye exams.  Stay current with your vaccines (immunizations).  Do not use any tobacco products, such as cigarettes, chewing tobacco, and e-cigarettes. If you need help quitting, ask your health care provider.  Limit alcohol intake to no more than 2 drinks per day.  One drink equals 12 ounces of beer, 5 ounces of wine, or 1 ounces of hard liquor.  Do not use street drugs.  Do not share needles.  Ask your health care provider for help if you need support or information about quitting drugs.  Tell your health care provider if you often feel depressed.  Tell your health care provider if you have ever been abused or do not feel safe at home. This information is not intended to replace advice given to you by your health care provider. Make sure you discuss any questions you have with your health care provider. Document Released: 10/18/2007 Document Revised: 12/19/2015 Document Reviewed: 01/23/2015 Elsevier Interactive Patient Education  2018 [employer]  

## 2018-01-18 NOTE — Assessment & Plan Note (Signed)
Down to 1/2 ppd. He is contemplative.

## 2018-01-27 ENCOUNTER — Other Ambulatory Visit (INDEPENDENT_AMBULATORY_CARE_PROVIDER_SITE_OTHER): Payer: BLUE CROSS/BLUE SHIELD

## 2018-01-27 DIAGNOSIS — R634 Abnormal weight loss: Secondary | ICD-10-CM | POA: Diagnosis not present

## 2018-01-27 LAB — FECAL OCCULT BLOOD, IMMUNOCHEMICAL: Fecal Occult Bld: NEGATIVE

## 2018-02-01 DIAGNOSIS — E119 Type 2 diabetes mellitus without complications: Secondary | ICD-10-CM | POA: Diagnosis not present

## 2018-02-01 LAB — HM DIABETES EYE EXAM

## 2018-02-09 ENCOUNTER — Other Ambulatory Visit: Payer: Self-pay | Admitting: Endocrinology

## 2018-02-26 ENCOUNTER — Other Ambulatory Visit: Payer: Self-pay | Admitting: Family Medicine

## 2018-03-09 ENCOUNTER — Other Ambulatory Visit: Payer: Self-pay | Admitting: Endocrinology

## 2018-03-17 ENCOUNTER — Other Ambulatory Visit (INDEPENDENT_AMBULATORY_CARE_PROVIDER_SITE_OTHER): Payer: BLUE CROSS/BLUE SHIELD

## 2018-03-17 DIAGNOSIS — E1165 Type 2 diabetes mellitus with hyperglycemia: Secondary | ICD-10-CM

## 2018-03-17 LAB — COMPREHENSIVE METABOLIC PANEL
ALK PHOS: 92 U/L (ref 39–117)
ALT: 133 U/L — AB (ref 0–53)
AST: 46 U/L — ABNORMAL HIGH (ref 0–37)
Albumin: 4.3 g/dL (ref 3.5–5.2)
BILIRUBIN TOTAL: 0.3 mg/dL (ref 0.2–1.2)
BUN: 21 mg/dL (ref 6–23)
CALCIUM: 9.1 mg/dL (ref 8.4–10.5)
CO2: 27 mEq/L (ref 19–32)
Chloride: 108 mEq/L (ref 96–112)
Creatinine, Ser: 0.94 mg/dL (ref 0.40–1.50)
GFR: 90.99 mL/min (ref >60.00)
Glucose, Bld: 193 mg/dL — ABNORMAL HIGH (ref 70–99)
POTASSIUM: 4.3 meq/L (ref 3.5–5.1)
Sodium: 140 mEq/L (ref 135–145)
TOTAL PROTEIN: 6.4 g/dL (ref 6.0–8.3)

## 2018-03-17 LAB — HEMOGLOBIN A1C: HEMOGLOBIN A1C: 6.9 % — AB (ref 4.6–6.5)

## 2018-03-22 ENCOUNTER — Ambulatory Visit: Payer: BLUE CROSS/BLUE SHIELD | Admitting: Endocrinology

## 2018-03-22 ENCOUNTER — Encounter: Payer: Self-pay | Admitting: Endocrinology

## 2018-03-22 VITALS — BP 120/80 | HR 60 | Ht 67.0 in | Wt 147.0 lb

## 2018-03-22 DIAGNOSIS — E1165 Type 2 diabetes mellitus with hyperglycemia: Secondary | ICD-10-CM

## 2018-03-22 DIAGNOSIS — R7989 Other specified abnormal findings of blood chemistry: Secondary | ICD-10-CM

## 2018-03-22 DIAGNOSIS — R945 Abnormal results of liver function studies: Secondary | ICD-10-CM | POA: Diagnosis not present

## 2018-03-22 MED ORDER — FREESTYLE LIBRE 14 DAY READER DEVI: 1.0000 | Freq: Once | 0 refills | Status: AC

## 2018-03-22 MED ORDER — FREESTYLE LIBRE 14 DAY SENSOR MISC: 1.0000 [IU] | 4 refills | Status: DC

## 2018-03-22 NOTE — Progress Notes (Signed)
Patient ID: Andrew Hart, male   DOB: [birth year], 47 y.o.   MRN: [id number]           Reason for Appointment: Follow-up for Type 2 Diabetes   History of Present Illness:          Date of diagnosis of type 2 diabetes mellitus: 2007 ?        Background history:  He was diagnosed with routine screening lab work He was initially tried on metformin but this caused diarrhea and he could not continue this Subsequently was treated with glipizide and apparently this was able to control his blood sugars fairly well for a few years Probably in early 2016 he was given Actos in addition to help his glucose control as A1c had gone up to 9.4 This helped transiently and A1c was 7.7 However his A1c had been otherwise over 8% since mid 2015 Because of  high blood sugars on his initial consultation in 05/2014 (A1c was 9.1) he was started on Invokana and Onglyza in addition to his Actos and glipizide.   Recent history:   Non-insulin hypoglycemic drugs the patient is taking are: Glipizide ER 10 mg daily, Actos 30 mg daily and Invokana 150 mg daily, Januvia 100 mg  His A1c in 12/2017 was 8.2 and now 6.9   Current blood sugar patterns and problems identified:  He was advised to improve his diet and see the dietitian as well as consider Trulicity on his last visit when A1c was persistently high but he did not want to see the dietitian  He says that he has generally tried to watch his diet better but may not be consistent with this  He does not like to prick his finger and check his sugar  He says that he feels better and he thinks his blood sugars are better now  Has only sporadic readings at home, probably about once a month at night and once last night  His lab glucose was 193 nonfasting, he thinks he had candy that morning  He was concerned about weight loss with Invokana on the last visit and this was reduced to half a tablet  He says that his appetite is improved now and he tends to get  hungry, has gradually gained weight  Not on any steroids recently  He says that with his work schedule he is staying very active now        Side effects from medications have been: Diarrhea from regular metformin  Compliance with the medical regimen: Fair Hypoglycemia: None   Glucose monitoring:  done 0-1  times a d ay         Glucometer:  Accu-Chek   Blood Glucose readings from download show only 1 reading  Review of readings over the last 2 months on his monitor so range of 84 up to 189 with only 3 readings  Dietician visit, most recent: 06/2015               Exercise: Usually active at work  Weight history:   Wt Readings from Last 3 Encounters:  03/22/18 147 lb (66.7 kg)  01/18/18 141 lb 12 oz (64.3 kg)  12/18/17 146 lb (66.2 kg)    Glycemic control:    Lab Results  Component Value Date   HGBA1C 6.9 (H) 03/17/2018   HGBA1C 8.2 (H) 12/14/2017   HGBA1C 9.0 (H) 07/06/2017   Lab Results  Component Value Date   MICROALBUR <0.7 07/06/2017   [place] 98 07/06/2017  CREATININE 0.94 03/17/2018    Lab on 03/17/2018  Component Date Value Ref Range Status  . Sodium 03/17/2018 140  135 - 145 mEq/L Final  . Potassium 03/17/2018 4.3  3.5 - 5.1 mEq/L Final  . Chloride 03/17/2018 108  96 - 112 mEq/L Final  . CO2 03/17/2018 27  19 - 32 mEq/L Final  . Glucose, Bld 03/17/2018 193* 70 - 99 mg/dL Final  . BUN 03/17/2018 21  6 - 23 mg/dL Final  . Creatinine, Ser 03/17/2018 0.94  0.40 - 1.50 mg/dL Final  . Total Bilirubin 03/17/2018 0.3  0.2 - 1.2 mg/dL Final  . Alkaline Phosphatase 03/17/2018 92  39 - 117 U/L Final  . AST 03/17/2018 46* 0 - 37 U/L Final  . ALT 03/17/2018 133* 0 - 53 U/L Final  . Total Protein 03/17/2018 6.4  6.0 - 8.3 g/dL Final  . Albumin 03/17/2018 4.3  3.5 - 5.2 g/dL Final  . Calcium 03/17/2018 9.1  8.4 - 10.5 mg/dL Final  . GFR 03/17/2018 90.99  >60.00 mL/min Final  . Hgb A1c MFr Bld 03/17/2018 6.9* 4.6 - 6.5 % Final   Glycemic Control Guidelines for  People with Diabetes:Non Diabetic:  <6%Goal of Therapy: <7%Additional Action Suggested:  >8%       Allergies as of 03/22/2018      Reactions   Janumet Xr [sitagliptin-metformin Hcl Er] Rash   Jardiance [empagliflozin] Rash   Metformin And Related Diarrhea   Other Rash   Methyldibromo Gluteronitrile - skin product preservative      Medication List        Accurate as of 03/22/18  9:39 AM. Always use your most recent med list.          ACCU-CHEK GUIDE w/Device Kit 1 kit by Does not apply route 2 (two) times daily. Use to check blood sugar twice a day   acyclovir 200 MG capsule Commonly known as:  ZOVIRAX Take 1 capsule (200 mg total) by mouth 3 (three) times daily. For 3-5 days as needed   aspirin 81 MG tablet Take 81 mg by mouth daily.   BAYER MICROLET LANCETS lancets Use as instructed to check blood sugar twice a day dx E11.65   busPIRone 10 MG tablet Commonly known as:  BUSPAR TAKE 1 TABLET BY MOUTH 2 TIMES DAILY.   citalopram 20 MG tablet Commonly known as:  CELEXA TAKE 1 TABLET BY MOUTH DAILY.   folic acid 1 MG tablet Commonly known as:  FOLVITE Take 1 mg by mouth daily.   glipiZIDE 10 MG 24 hr tablet Commonly known as:  GLUCOTROL XL TAKE 1 TABLET DAILY WITH BREAKFAST BY MOUTH.   glucose blood test strip Use to check blood sugars twice daily   hydrOXYzine 25 MG tablet Commonly known as:  ATARAX/VISTARIL Take 1 tablet (25 mg total) by mouth 3 (three) times daily as needed.   INVOKANA 300 MG Tabs tablet Generic drug:  canagliflozin TAKE 1 TABLET DAILY BEFORE BREAKFAST BY MOUTH.   methotrexate 5 MG tablet Commonly known as:  RHEUMATREX Take 5 mg by mouth once a week. Caution: Chemotherapy. Protect from light.   pantoprazole 20 MG tablet Commonly known as:  PROTONIX TAKE 1 TABLET (20 MG TOTAL) BY MOUTH DAILY.   pioglitazone 30 MG tablet Commonly known as:  ACTOS TAKE 1 TABLET BY MOUTH DAILY.   simvastatin 80 MG tablet Commonly known as:   ZOCOR TAKE 1 TABLET BY MOUTH DAILY.   sitaGLIPtin 100 MG tablet Commonly known as:  JANUVIA Take 1 tablet (100 mg total) by mouth daily.   JANUVIA 100 MG tablet Generic drug:  sitaGLIPtin TAKE 1 TABLET BY MOUTH DAILY.   traZODone 150 MG tablet Commonly known as:  DESYREL TAKE 1/2 TABLET (75 MG TOTAL) BY MOUTH AT BEDTIME.   triamcinolone cream 0.1 % Commonly known as:  KENALOG Apply to affected area twice daily. Do not apply to face.       Allergies:  Allergies  Allergen Reactions  . Janumet Xr [Sitagliptin-Metformin Hcl Er] Rash  . Jardiance [Empagliflozin] Rash  . Metformin And Related Diarrhea  . Other Rash    Methyldibromo Gluteronitrile - skin product preservative    Past Medical History:  Diagnosis Date  . Alcohol use 11/08/2015  . Diabetes mellitus without complication ([place]) 0100   Elli [place]es[place]  . GERD (gastroesophageal reflux disease)   . Herpes labialis   . History of chicken pox   . Hyperlipidemia     Past Surgical History:  Procedure Laterality Date  . APPENDECTOMY    . FINGER SURGERY Right    2 screws in ring finger  . TONSILLECTOMY      Family History  Problem Relation Age of Onset  . Diabetes Mother   . CAD Father 61       MI at 74, CABG at 71  . Diabetes Maternal Grandmother   . Parkinsonism Mother 57       progressive supranuclear palsy, deceased  . Stroke Neg Hx   . Cancer Neg Hx     Social History:  reports that he has been smoking cigarettes. He has a 7.50 pack-year smoking history. He has never used smokeless tobacco. He reports that he drinks alcohol. He reports that he has current or past drug history.    Review of Systems    Lipid history:  Has been on 80 mg simvastatin for treatment, prescribed by PCP  His lipids are well controlled    Lab Results  Component Value Date   CHOL 169 07/06/2017   HDL 44.60 07/06/2017   LDLCALC 98 07/06/2017   TRIG 133.0 07/06/2017   CHOLHDL 4 07/06/2017            Most recent foot exam:  10/2016  He has not had any alcohol since April However his liver functions are abnormal, he has been on methotrexate from dermatologist for the last 4 months  Lab Results  Component Value Date   ALT 133 (H) 03/17/2018       Review of Systems     Physical Examination:  BP 116/84   Pulse 60   Ht '5\' 7"'  (1.702 m)   Wt 147 lb (66.7 kg)   SpO2 98%   BMI 23.02 kg/m    ASSESSMENT:  Diabetes type 2, non-insulin-dependent  See history of present illness for detailed discussion of current diabetes management, blood sugar patterns and problems identified  His A1c is much better at 6.9, previously 8.2  This is likely to be from overall better diet and increase activity level although his weight has gone up Checking blood sugars very erratically at home Most recent lab glucose 193 at home nonfasting  PLAN:    Consistent diet and keep away from high carbohydrate high fat foods Avoid candy and other sweets which may raise his blood sugars More consistent monitoring of blood sugars at home including after meals Call if blood sugars are running high  He will discuss his abnormal liver function with his dermatologist and PCP  There are no Patient Instructions on file for this visit.   Elayne Snare 03/22/2018, 9:39 AM   Note: This office note was prepared with Dragon voice recognition system technology. Any transcriptional errors that result from this process are unintentional.

## 2018-03-22 NOTE — Patient Instructions (Addendum)
Check blood sugars on waking up 2-3 days a week  Also check blood sugars about 2 hours after meals and do this after different meals by rotation  Recommended blood sugar levels on waking up are 90-130 and about 2 hours after meal is 130-160  Please bring your blood sugar monitor to each visit, thank you  Lo fat meals

## 2018-03-29 ENCOUNTER — Other Ambulatory Visit: Payer: Self-pay | Admitting: Family Medicine

## 2018-04-08 DIAGNOSIS — L299 Pruritus, unspecified: Secondary | ICD-10-CM | POA: Diagnosis not present

## 2018-04-08 DIAGNOSIS — L259 Unspecified contact dermatitis, unspecified cause: Secondary | ICD-10-CM | POA: Diagnosis not present

## 2018-04-16 ENCOUNTER — Other Ambulatory Visit: Payer: Self-pay | Admitting: Endocrinology

## 2018-05-02 ENCOUNTER — Telehealth: Payer: Self-pay | Admitting: Family Medicine

## 2018-05-02 DIAGNOSIS — R7401 Elevation of levels of liver transaminase levels: Secondary | ICD-10-CM

## 2018-05-02 DIAGNOSIS — R740 Nonspecific elevation of levels of transaminase and lactic acid dehydrogenase [LDH]: Principal | ICD-10-CM

## 2018-05-02 NOTE — Telephone Encounter (Signed)
Received note from endocrinologist about elevated liver function. This could be coming from methotrexate or possibly cholesterol medicine. Did he touch base with dermatology about methotrexate? Does he have planned rpt labs?

## 2018-05-03 NOTE — Telephone Encounter (Signed)
Spoke with pt relaying Dr. Timoteo ExposeG's message. Pt verbalizes understanding and states he did talk with dermatology about the methotrexate and has stopped it about 1 mo ago. Says there are not plans for repeat labs at this time.

## 2018-05-04 NOTE — Addendum Note (Signed)
Addended by: Eustaquio BoydenGUTIERREZ, Maude Hettich on: 05/04/2018 07:48 AM   Modules accepted: Orders

## 2018-05-04 NOTE — Telephone Encounter (Signed)
Would offer lab visit to recheck liver functions to ensure improving off MTX. May come in at his convenience, doesn't have to be fasting.

## 2018-05-04 NOTE — Telephone Encounter (Signed)
Spoke with pt relaying Dr. Timoteo ExposeG's message.  Pt scheduled for lab visit on 05/06/17 at 2:00 PM.

## 2018-05-06 ENCOUNTER — Other Ambulatory Visit (INDEPENDENT_AMBULATORY_CARE_PROVIDER_SITE_OTHER): Payer: BLUE CROSS/BLUE SHIELD

## 2018-05-06 DIAGNOSIS — E1165 Type 2 diabetes mellitus with hyperglycemia: Secondary | ICD-10-CM | POA: Diagnosis not present

## 2018-05-06 DIAGNOSIS — R7401 Elevation of levels of liver transaminase levels: Secondary | ICD-10-CM

## 2018-05-06 DIAGNOSIS — R740 Nonspecific elevation of levels of transaminase and lactic acid dehydrogenase [LDH]: Secondary | ICD-10-CM

## 2018-05-06 DIAGNOSIS — R7989 Other specified abnormal findings of blood chemistry: Secondary | ICD-10-CM

## 2018-05-06 DIAGNOSIS — R945 Abnormal results of liver function studies: Secondary | ICD-10-CM

## 2018-05-06 LAB — MICROALBUMIN / CREATININE URINE RATIO
Creatinine,U: 25.7 mg/dL
Microalb Creat Ratio: 2.7 mg/g (ref 0.0–30.0)
Microalb, Ur: <0.7 mg/dL (ref 0.0–1.9)

## 2018-05-06 LAB — HEPATIC FUNCTION PANEL
ALK PHOS: 85 U/L (ref 39–117)
ALT: 23 U/L (ref 0–53)
AST: 15 U/L (ref 0–37)
Albumin: 4.6 g/dL (ref 3.5–5.2)
BILIRUBIN DIRECT: 0.1 mg/dL (ref 0.0–0.3)
BILIRUBIN TOTAL: 0.4 mg/dL (ref 0.2–1.2)
TOTAL PROTEIN: 7 g/dL (ref 6.0–8.3)

## 2018-05-06 LAB — COMPREHENSIVE METABOLIC PANEL
ALT: 23 U/L (ref 0–53)
AST: 15 U/L (ref 0–37)
Albumin: 4.6 g/dL (ref 3.5–5.2)
Alkaline Phosphatase: 85 U/L (ref 39–117)
BUN: 23 mg/dL (ref 6–23)
CO2: 27 mEq/L (ref 19–32)
Calcium: 9.3 mg/dL (ref 8.4–10.5)
Chloride: 104 mEq/L (ref 96–112)
Creatinine, Ser: 1.03 mg/dL (ref 0.40–1.50)
GFR: 81.84 mL/min (ref >60.00)
Glucose, Bld: 146 mg/dL — ABNORMAL HIGH (ref 70–99)
Potassium: 4.3 mEq/L (ref 3.5–5.1)
Sodium: 137 mEq/L (ref 135–145)
Total Bilirubin: 0.4 mg/dL (ref 0.2–1.2)
Total Protein: 7 g/dL (ref 6.0–8.3)

## 2018-05-06 LAB — LIPID PANEL
Cholesterol: 156 mg/dL (ref 0–200)
HDL: 47.4 mg/dL (ref >39.00)
LDL Cholesterol: 84 mg/dL (ref 0–99)
NonHDL: 108.66
TRIGLYCERIDES: 122 mg/dL (ref 0.0–149.0)
Total CHOL/HDL Ratio: 3
VLDL: 24.4 mg/dL (ref 0.0–40.0)

## 2018-05-06 LAB — HEMOGLOBIN A1C: Hgb A1c MFr Bld: 7 % — ABNORMAL HIGH (ref 4.6–6.5)

## 2018-05-25 ENCOUNTER — Other Ambulatory Visit: Payer: Self-pay | Admitting: Family Medicine

## 2018-06-21 ENCOUNTER — Other Ambulatory Visit: Payer: Self-pay | Admitting: Family Medicine

## 2018-06-21 ENCOUNTER — Other Ambulatory Visit: Payer: Self-pay | Admitting: Endocrinology

## 2018-07-01 ENCOUNTER — Other Ambulatory Visit: Payer: Self-pay | Admitting: Endocrinology

## 2018-07-08 ENCOUNTER — Telehealth: Payer: Self-pay

## 2018-07-08 NOTE — Telephone Encounter (Signed)
Pt wants to know if he can be tested for lyme's disease. I spoke with pt; pt has had a rash for 1 1/2 years. No fever but sometimes pt is very tired. Pt has been to dermatologist and tested for allergies; when takes prednisone rash goes away and comes back. Pt said 1 1/2 yrs ago mowed high grass area and then developed rash and pt wonders if got a deer tick that was not discovered. Pt scheduled appt with Dr Reece Agar 07/09/18 at 3:30. FYI to Dr Reece Agar.

## 2018-07-09 ENCOUNTER — Encounter: Payer: Self-pay | Admitting: Family Medicine

## 2018-07-09 ENCOUNTER — Ambulatory Visit: Payer: BLUE CROSS/BLUE SHIELD | Admitting: Family Medicine

## 2018-07-09 VITALS — BP 118/76 | HR 62 | Temp 98.5°F | Ht 66.5 in | Wt 150.4 lb

## 2018-07-09 DIAGNOSIS — W57XXXS Bitten or stung by nonvenomous insect and other nonvenomous arthropods, sequela: Secondary | ICD-10-CM | POA: Diagnosis not present

## 2018-07-09 DIAGNOSIS — R21 Rash and other nonspecific skin eruption: Secondary | ICD-10-CM | POA: Diagnosis not present

## 2018-07-09 NOTE — Telephone Encounter (Signed)
Will see today.  

## 2018-07-09 NOTE — Assessment & Plan Note (Signed)
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  Pt worried this may be untreated lyme disease - discussed it is not consistent with this. Give concomitant fatigue, will check titers.

## 2018-07-09 NOTE — Progress Notes (Signed)
BP 118/76 (BP Location: Left Arm, Patient Position: Sitting, Cuff Size: Normal)   Pulse 62   Temp 98.5 F (36.9 C) (Oral)   Ht 5' 6.5" (1.689 m)   Wt 150 lb 7 oz (68.2 kg)   SpO2 96%   BMI 23.92 kg/m    CC: rash Subjective:    Patient ID: Andrew Hart, male    DOB: 06-Oct-1970, 48 y.o.   MRN: [id number]  HPI: Andrew Hart is a 48 y.o. male presenting on 07/09/2018 for Rash (C/o rash located all over for 1.5 yrs ago. Areas are painful and itchy. Wants to be tested for lyme dz. )   Itchy rash present for 1.5 yrs followed by Andrew Hart. MTX was beneficial, but was stopped due to transaminitis. Previous biopsy by derm showed contact dermatitis reactive to Andrew Hart preservative - now following fragrance free regimen. Rash tends to improve with prednisone, but persists. Rash may have started after he mowed lawn 1.5 yrs ago.   T2DM followed by Dr Andrew Hart last seen 03/2018.  Lab Results  Component Value Date   HGBA1C 7.0 (H) 05/06/2018     Worried rash may come from lyme disease.  He had been in tall grass and did pull off 2 ticks last year.  Fatigue off and on.  No fevers, abd pain, nausea. Never had bullseye rash. Fingers feel achey at times. Occasional intermittent headache treated with ibuprofen.      Relevant past medical, surgical, family and social history reviewed and updated as indicated. Interim medical history since our last visit reviewed. Allergies and medications reviewed and updated. Outpatient Medications Prior to Visit  Medication Sig Dispense Refill  . acyclovir (ZOVIRAX) 200 MG capsule Take 1 capsule (200 mg total) by mouth 3 (three) times daily. For 3-5 days as needed 30 capsule 3  . aspirin 81 MG tablet Take 81 mg by mouth daily.    Marland Kitchen BAYER MICROLET LANCETS lancets Use as instructed to check blood sugar twice a day dx E11.65 100 each 3  . Blood Glucose Monitoring Suppl (ACCU-CHEK GUIDE) w/Device KIT 1 kit by Does not apply route 2 (two) times  daily. Use to check blood sugar twice a day 1 kit 2  . busPIRone (BUSPAR) 10 MG tablet TAKE 1 TABLET BY MOUTH 2 TIMES DAILY. 180 tablet 1  . canagliflozin (INVOKANA) 300 MG TABS tablet Takes 1/2 tablet daily 30 tablet 3  . citalopram (CELEXA) 20 MG tablet TAKE 1 TABLET BY MOUTH DAILY. 30 tablet 6  . Continuous Blood Gluc Sensor (FREESTYLE LIBRE 14 DAY SENSOR) MISC 1 Units by Does not apply route every 14 (fourteen) days. 2 each 4  . glipiZIDE (GLUCOTROL XL) 10 MG 24 hr tablet TAKE 1 TABLET DAILY WITH BREAKFAST BY MOUTH. 30 tablet 3  . glucose blood (ACCU-CHEK GUIDE) test strip Use to check blood sugars twice daily 100 each 3  . hydrOXYzine (ATARAX/VISTARIL) 25 MG tablet Take 1 tablet (25 mg total) by mouth 3 (three) times daily as needed.  0  . JANUVIA 100 MG tablet TAKE 1 TABLET BY MOUTH DAILY. 30 tablet 3  . pantoprazole (PROTONIX) 20 MG tablet TAKE 1 TABLET (20 MG TOTAL) BY MOUTH DAILY. 30 tablet 3  . pioglitazone (ACTOS) 30 MG tablet TAKE 1 TABLET BY MOUTH DAILY. 30 tablet 3  . simvastatin (ZOCOR) 80 MG tablet TAKE 1 TABLET BY MOUTH DAILY. 30 tablet 6  . traZODone (DESYREL) 150 MG tablet TAKE 1/2 TABLET (75 MG TOTAL) BY MOUTH  AT BEDTIME. 45 tablet 3  . triamcinolone cream (KENALOG) 0.1 % Apply to affected area twice daily. Do not apply to face.    . sitaGLIPtin (JANUVIA) 100 MG tablet Take 1 tablet (100 mg total) by mouth daily. 30 tablet 3  . folic acid (FOLVITE) 1 MG tablet Take 1 mg by mouth daily.    . methotrexate (RHEUMATREX) 5 MG tablet Take 5 mg by mouth once a week. Caution: Chemotherapy. Protect from light.     No facility-administered medications prior to visit.      Per HPI unless specifically indicated in ROS section below Review of Systems Objective:    BP 118/76 (BP Location: Left Arm, Patient Position: Sitting, Cuff Size: Normal)   Pulse 62   Temp 98.5 F (36.9 C) (Oral)   Ht 5' 6.5" (1.689 m)   Wt 150 lb 7 oz (68.2 kg)   SpO2 96%   BMI 23.92 kg/m   Wt Readings  from Last 3 Encounters:  07/09/18 150 lb 7 oz (68.2 kg)  03/22/18 147 lb (66.7 kg)  01/18/18 141 lb 12 oz (64.3 kg)    Physical Exam Vitals signs and nursing note reviewed.  Constitutional:      Appearance: Normal appearance.  Skin:    Findings: Rash present.     Comments: Pruritic excoriated papules that scab throughout body. Some areas with surrounding bruise  Neurological:     Mental Status: He is alert.       Results for orders placed or performed in visit on 05/06/18  Hepatic function panel  Result Value Ref Range   Total Bilirubin 0.4 0.2 - 1.2 mg/dL   Bilirubin, Direct 0.1 0.0 - 0.3 mg/dL   Alkaline Phosphatase 85 39 - 117 U/L   AST 15 0 - 37 U/L   ALT 23 0 - 53 U/L   Total Protein 7.0 6.0 - 8.3 g/dL   Albumin 4.6 3.5 - 5.2 g/dL  Lipid panel  Result Value Ref Range   Cholesterol 156 0 - 200 mg/dL   Triglycerides 122.0 0.0 - 149.0 mg/dL   HDL 47.40 >39.00 mg/dL   VLDL 24.4 0.0 - 40.0 mg/dL   LDL Cholesterol 84 0 - 99 mg/dL   Total CHOL/HDL Ratio 3    NonHDL 108.66   Comprehensive metabolic panel  Result Value Ref Range   Sodium 137 135 - 145 mEq/L   Potassium 4.3 3.5 - 5.1 mEq/L   Chloride 104 96 - 112 mEq/L   CO2 27 19 - 32 mEq/L   Glucose, Bld 146 (H) 70 - 99 mg/dL   BUN 23 6 - 23 mg/dL   Creatinine, Ser 1.03 0.40 - 1.50 mg/dL   Total Bilirubin 0.4 0.2 - 1.2 mg/dL   Alkaline Phosphatase 85 39 - 117 U/L   AST 15 0 - 37 U/L   ALT 23 0 - 53 U/L   Total Protein 7.0 6.0 - 8.3 g/dL   Albumin 4.6 3.5 - 5.2 g/dL   Calcium 9.3 8.4 - 10.5 mg/dL   GFR 81.84 >60.00 mL/min  Hemoglobin A1c  Result Value Ref Range   Hgb A1c MFr Bld 7.0 (H) 4.6 - 6.5 %  Microalbumin / creatinine urine ratio  Result Value Ref Range   Microalb, Ur <0.7 0.0 - 1.9 mg/dL   Creatinine,U 25.7 mg/dL   Microalb Creat Ratio 2.7 0.0 - 30.0 mg/g   Assessment & Plan:   Problem List Items Addressed This Visit    Skin rash - Primary  S/p biopsy by derm consistent with contact dermatitis  with atopy.  Pt worried this may be untreated lyme disease - discussed it is not consistent with this. Give concomitant fatigue, will check titers.       Relevant Orders   B. Burgdorfi Antibodies by WB    Other Visit Diagnoses    Tick bite, sequela       Relevant Orders   B. Burgdorfi Antibodies by WB       No orders of the defined types were placed in this encounter.  Orders Placed This Encounter  Procedures  . B. Burgdorfi Antibodies by WB    Follow up plan: No follow-ups on file.  Ria Bush, MD

## 2018-07-09 NOTE — Patient Instructions (Addendum)
Labs today - lyme disease titers. We will be in touch with results.

## 2018-07-13 LAB — B. BURGDORFI ANTIBODIES BY WB
B burgdorferi IgG Abs (IB): NEGATIVE
B burgdorferi IgM Abs (IB): NEGATIVE
LYME DISEASE 58 KD IGG: NONREACTIVE
Lyme Disease 18 kD IgG: NONREACTIVE
Lyme Disease 23 kD IgG: NONREACTIVE
Lyme Disease 23 kD IgM: NONREACTIVE
Lyme Disease 28 kD IgG: NONREACTIVE
Lyme Disease 30 kD IgG: NONREACTIVE
Lyme Disease 39 kD IgG: NONREACTIVE
Lyme Disease 39 kD IgM: NONREACTIVE
Lyme Disease 41 kD IgG: REACTIVE — AB
Lyme Disease 41 kD IgM: NONREACTIVE
Lyme Disease 45 kD IgG: NONREACTIVE
Lyme Disease 66 kD IgG: NONREACTIVE
Lyme Disease 93 kD IgG: NONREACTIVE

## 2018-07-17 ENCOUNTER — Other Ambulatory Visit: Payer: Self-pay | Admitting: Endocrinology

## 2018-07-17 DIAGNOSIS — E1165 Type 2 diabetes mellitus with hyperglycemia: Secondary | ICD-10-CM

## 2018-07-19 ENCOUNTER — Other Ambulatory Visit (INDEPENDENT_AMBULATORY_CARE_PROVIDER_SITE_OTHER): Payer: BLUE CROSS/BLUE SHIELD

## 2018-07-19 ENCOUNTER — Other Ambulatory Visit: Payer: Self-pay

## 2018-07-19 DIAGNOSIS — E1165 Type 2 diabetes mellitus with hyperglycemia: Secondary | ICD-10-CM | POA: Diagnosis not present

## 2018-07-19 LAB — BASIC METABOLIC PANEL
BUN: 16 mg/dL (ref 6–23)
CO2: 28 mEq/L (ref 19–32)
CREATININE: 1.06 mg/dL (ref 0.40–1.50)
Calcium: 9.3 mg/dL (ref 8.4–10.5)
Chloride: 107 mEq/L (ref 96–112)
GFR: 74.42 mL/min (ref >60.00)
Glucose, Bld: 169 mg/dL — ABNORMAL HIGH (ref 70–99)
Potassium: 4.1 mEq/L (ref 3.5–5.1)
Sodium: 140 mEq/L (ref 135–145)

## 2018-07-20 LAB — FRUCTOSAMINE: Fructosamine: 261 umol/L (ref 0–285)

## 2018-07-22 ENCOUNTER — Other Ambulatory Visit: Payer: Self-pay

## 2018-07-22 ENCOUNTER — Encounter: Payer: Self-pay | Admitting: Endocrinology

## 2018-07-22 ENCOUNTER — Ambulatory Visit: Payer: BLUE CROSS/BLUE SHIELD | Admitting: Endocrinology

## 2018-07-22 ENCOUNTER — Telehealth: Payer: Self-pay

## 2018-07-22 VITALS — BP 120/70 | HR 67 | Temp 98.5°F | Ht 66.5 in | Wt 150.2 lb

## 2018-07-22 DIAGNOSIS — E1165 Type 2 diabetes mellitus with hyperglycemia: Secondary | ICD-10-CM | POA: Diagnosis not present

## 2018-07-22 MED ORDER — PIOGLITAZONE HCL 45 MG PO TABS: 45.0000 mg | ORAL_TABLET | Freq: Every day | ORAL | 3 refills | Status: DC

## 2018-07-22 MED ORDER — CANAGLIFLOZIN 300 MG PO TABS: ORAL_TABLET | ORAL | 4 refills | Status: DC

## 2018-07-22 NOTE — Patient Instructions (Addendum)
Check blood sugars on waking up days a week  Also check blood sugars about 2 hours after meals and do this after different meals by rotation  Recommended blood sugar levels on waking up are 90-130 and about 2 hours after meal is 130-160  Please bring your blood sugar monitor to each visit, thank you  q

## 2018-07-22 NOTE — Telephone Encounter (Signed)
New Rx printed for MD to sign in order to fax to [facility] patient assistance foundation in regards to medication Invokana 300mg  PO daily. Sent to attention: PAT# [id number]

## 2018-07-22 NOTE — Progress Notes (Signed)
Patient ID: Andrew Hart, male   DOB: Aug 02, 1970, 48 y.o.   MRN: [id number]           Reason for Appointment: Follow-up for Type 2 Diabetes   History of Present Illness:          Date of diagnosis of type 2 diabetes mellitus: 2007 ?        Background history:  He was diagnosed with routine screening lab work He was initially tried on metformin but this caused diarrhea and he could not continue this Subsequently was treated with glipizide and apparently this was able to control his blood sugars fairly well for a few years Probably in early 2016 he was given Actos in addition to help his glucose control as A1c had gone up to 9.4 This helped transiently and A1c was 7.7 However his A1c had been otherwise over 8% since mid 2015 Because of  high blood sugars on his initial consultation in 05/2014 (A1c was 9.1) he was started on Invokana and Onglyza in addition to his Actos and glipizide.   Recent history:   Non-insulin hypoglycemic drugs currently: Glipizide ER 10 mg daily, Actos 30 mg daily and Invokana 150 mg daily, Januvia 100 mg  His A1c in 12/2017 was 8.2 and sequently has been below 7, now 7%   Current blood sugar patterns and problems identified:  He does not like to check his sugars as he is phobic of pricking his finger  Not clear why his lab glucose was 169, probably fasting although he is not completely sure  He has other lab work previously which showed variable blood sugars  Although he has been fairly active he does not appear to have any recent weight change  He has been able to get all his medications consistently  He was concerned about weight loss with Invokana previously and this was reduced to half a tablet and he thinks his weight is better with this He is usually avoiding snacks late at night but generally eating large portions       Side effects from medications have been: Diarrhea from regular metformin  Compliance with the medical regimen: Fair  Hypoglycemia: None   Glucose monitoring:  done 0-1  times a d ay         Glucometer:  Accu-Chek   Blood Glucose readings none recently    Dietician visit, most recent: 06/2015               Exercise: Usually active at work  Weight history:   Wt Readings from Last 3 Encounters:  07/22/18 150 lb 3.2 oz (68.1 kg)  07/09/18 150 lb 7 oz (68.2 kg)  03/22/18 147 lb (66.7 kg)    Glycemic control:    Lab Results  Component Value Date   HGBA1C 7.0 (H) 05/06/2018   HGBA1C 6.9 (H) 03/17/2018   HGBA1C 8.2 (H) 12/14/2017   Lab Results  Component Value Date   MICROALBUR <0.7 05/06/2018   LDLCALC 84 05/06/2018   CREATININE 1.06 07/19/2018    Lab on 07/19/2018  Component Date Value Ref Range Status  . Fructosamine 07/19/2018 261  0 - 285 umol/L Final   Comment: Published reference interval for apparently healthy subjects between age 54 and 68 is 7 - 285 umol/L and in a poorly controlled diabetic population is 228 - 563 umol/L with a mean of 396 umol/L.   Marland Kitchen Sodium 07/19/2018 140  135 - 145 mEq/L Final  . Potassium 07/19/2018 4.1  3.5 -  5.1 mEq/L Final  . Chloride 07/19/2018 107  96 - 112 mEq/L Final  . CO2 07/19/2018 28  19 - 32 mEq/L Final  . Glucose, Bld 07/19/2018 169* 70 - 99 mg/dL Final  . BUN 07/19/2018 16  6 - 23 mg/dL Final  . Creatinine, Ser 07/19/2018 1.06  0.40 - 1.50 mg/dL Final  . Calcium 07/19/2018 9.3  8.4 - 10.5 mg/dL Final  . GFR 07/19/2018 74.42  >60.00 mL/min Final      Allergies as of 07/22/2018      Reactions   Janumet Xr [sitagliptin-metformin Hcl Er] Rash   Jardiance [empagliflozin] Rash   Metformin And Related Diarrhea   Methotrexate Derivatives Other (See Comments)   transaminitis   Other Rash   Methyldibromo Gluteronitrile - skin product preservative      Medication List       Accurate as of July 22, 2018 11:40 AM. Always use your most recent med list.        Accu-Chek Guide w/Device Kit 1 kit by Does not apply route 2 (two) times  daily. Use to check blood sugar twice a day   acyclovir 200 MG capsule Commonly known as:  ZOVIRAX Take 1 capsule (200 mg total) by mouth 3 (three) times daily. For 3-5 days as needed   aspirin 81 MG tablet Take 81 mg by mouth daily.   Bayer Microlet Lancets lancets Use as instructed to check blood sugar twice a day dx E11.65   busPIRone 10 MG tablet Commonly known as:  BUSPAR TAKE 1 TABLET BY MOUTH 2 TIMES DAILY.   canagliflozin 300 MG Tabs tablet Commonly known as:  Invokana Takes 1/2 tablet daily   citalopram 20 MG tablet Commonly known as:  CELEXA TAKE 1 TABLET BY MOUTH DAILY.   glipiZIDE 10 MG 24 hr tablet Commonly known as:  GLUCOTROL XL TAKE 1 TABLET DAILY WITH BREAKFAST BY MOUTH.   glucose blood test strip Commonly known as:  Accu-Chek Guide Use to check blood sugars twice daily   hydrOXYzine 25 MG tablet Commonly known as:  ATARAX/VISTARIL Take 1 tablet (25 mg total) by mouth 3 (three) times daily as needed.   Januvia 100 MG tablet Generic drug:  sitaGLIPtin TAKE 1 TABLET BY MOUTH DAILY.   pantoprazole 20 MG tablet Commonly known as:  PROTONIX TAKE 1 TABLET (20 MG TOTAL) BY MOUTH DAILY.   pioglitazone 30 MG tablet Commonly known as:  ACTOS TAKE 1 TABLET BY MOUTH DAILY.   simvastatin 80 MG tablet Commonly known as:  ZOCOR TAKE 1 TABLET BY MOUTH DAILY.   traZODone 150 MG tablet Commonly known as:  DESYREL TAKE 1/2 TABLET (75 MG TOTAL) BY MOUTH AT BEDTIME.   triamcinolone cream 0.1 % Commonly known as:  KENALOG Apply to affected area twice daily. Do not apply to face.       Allergies:  Allergies  Allergen Reactions  . Janumet Xr [Sitagliptin-Metformin Hcl Er] Rash  . Jardiance [Empagliflozin] Rash  . Metformin And Related Diarrhea  . Methotrexate Derivatives Other (See Comments)    transaminitis  . Other Rash    Methyldibromo Gluteronitrile - skin product preservative    Past Medical History:  Diagnosis Date  . Alcohol use 11/08/2015   . Diabetes mellitus without complication ([place]) 0315   [facility]  . GERD (gastroesophageal reflux disease)   . Herpes labialis   . History of chicken pox   . Hyperlipidemia     Past Surgical History:  Procedure Laterality Date  . APPENDECTOMY    .  FINGER SURGERY Right    2 screws in ring finger  . TONSILLECTOMY      Family History  Problem Relation Age of Onset  . Diabetes Mother   . CAD Father 47       MI at 22, CABG at 11  . Diabetes Maternal Grandmother   . Parkinsonism Mother 14       progressive supranuclear palsy, deceased  . Stroke Neg Hx   . Cancer Neg Hx     Social History:  reports that he has been smoking cigarettes. He has a 7.50 pack-year smoking history. He has never used smokeless tobacco. He reports current alcohol use. He reports current drug use.    Review of Systems    Lipid history:  Has been on 80 mg simvastatin for treatment, prescribed by PCP  His lipids are well controlled    Lab Results  Component Value Date   CHOL 156 05/06/2018   HDL 47.40 05/06/2018   Abrams 84 05/06/2018   TRIG 122.0 05/06/2018   CHOLHDL 3 05/06/2018            Most recent foot exam: 3/20  He has not had any alcohol  Liver functions better with stopping methotrexate  Lab Results  Component Value Date   ALT 23 05/06/2018   ALT 23 05/06/2018    No pain in calf muscles on walking  No microalbuminuria  Review of Systems     Physical Examination:  BP 120/70 (BP Location: Left Arm, Patient Position: Sitting, Cuff Size: Normal)   Pulse 67   Temp 98.5 F (36.9 C) (Oral)   Ht 5' 6.5" (1.689 m)   Wt 150 lb 3.2 oz (68.1 kg)   SpO2 97%   BMI 23.88 kg/m   Diabetic Foot Exam - Simple   Simple Foot Form Diabetic Foot exam was performed with the following findings:  Yes 07/22/2018  9:25 AM  Visual Inspection No deformities, no ulcerations, no other skin breakdown bilaterally:  Yes Sensation Testing Intact to touch and monofilament testing bilaterally:  Yes  Pulse Check Posterior Tibialis and Dorsalis pulse intact bilaterally:  Yes See comments:  Yes Comments Pedal pulses difficult to palpate on left      ASSESSMENT:  Diabetes type 2, non-insulin-dependent  See history of present illness for detailed discussion of current diabetes management, blood sugar patterns and problems identified  His A1c is consistently better at 7%  He is on multiple drugs Although he is benefiting from [place] he is reluctant to try it to 300 mg dose because he thought it was causing excessive weight loss Does not monitor at home Since his fasting/morning blood sugar was 169 he may have periodic high readings also  He is usually eating relatively large portions to maintain his weight  PLAN:    Will try 45 mg of Actos Continue other medications unchanged Encouraged him to check his blood sugar periodically and call if consistently high     Patient Instructions  Check blood sugars on waking up days a week  Also check blood sugars about 2 hours after meals and do this after different meals by rotation  Recommended blood sugar levels on waking up are 90-130 and about 2 hours after meal is 130-160  Please bring your blood sugar monitor to each visit, thank you  q    Elayne Snare 07/22/2018, 11:40 AM   Note: This office note was prepared with Dragon voice recognition system technology. Any transcriptional errors that result from this  process are unintentional.

## 2018-07-26 ENCOUNTER — Other Ambulatory Visit: Payer: Self-pay | Admitting: Family Medicine

## 2018-08-03 ENCOUNTER — Other Ambulatory Visit: Payer: Self-pay | Admitting: Endocrinology

## 2018-08-17 ENCOUNTER — Other Ambulatory Visit: Payer: Self-pay | Admitting: Family Medicine

## 2018-08-18 NOTE — Telephone Encounter (Signed)
Electronic refill request Acyclovir Last office visit 07/09/18 Last refill 05/28/17 #30/3

## 2018-08-20 NOTE — Telephone Encounter (Signed)
refilled 

## 2018-08-20 NOTE — Telephone Encounter (Signed)
Patient is completely out of his ACYCLOVIR

## 2018-08-27 ENCOUNTER — Other Ambulatory Visit: Payer: Self-pay | Admitting: Family Medicine

## 2018-10-18 ENCOUNTER — Other Ambulatory Visit: Payer: Self-pay | Admitting: Family Medicine

## 2018-10-18 ENCOUNTER — Other Ambulatory Visit: Payer: Self-pay | Admitting: Endocrinology

## 2018-10-19 ENCOUNTER — Other Ambulatory Visit (INDEPENDENT_AMBULATORY_CARE_PROVIDER_SITE_OTHER): Payer: BC Managed Care – PPO

## 2018-10-19 ENCOUNTER — Other Ambulatory Visit: Payer: Self-pay

## 2018-10-19 DIAGNOSIS — E1165 Type 2 diabetes mellitus with hyperglycemia: Secondary | ICD-10-CM | POA: Diagnosis not present

## 2018-10-19 LAB — COMPREHENSIVE METABOLIC PANEL
ALT: 21 U/L (ref 0–53)
AST: 15 U/L (ref 0–37)
Albumin: 4.3 g/dL (ref 3.5–5.2)
Alkaline Phosphatase: 76 U/L (ref 39–117)
BUN: 19 mg/dL (ref 6–23)
CO2: 26 mEq/L (ref 19–32)
Calcium: 9 mg/dL (ref 8.4–10.5)
Chloride: 104 mEq/L (ref 96–112)
Creatinine, Ser: 0.92 mg/dL (ref 0.40–1.50)
GFR: 87.55 mL/min (ref >60.00)
Glucose, Bld: 130 mg/dL — ABNORMAL HIGH (ref 70–99)
Potassium: 3.9 mEq/L (ref 3.5–5.1)
Sodium: 136 mEq/L (ref 135–145)
Total Bilirubin: 0.5 mg/dL (ref 0.2–1.2)
Total Protein: 6.5 g/dL (ref 6.0–8.3)

## 2018-10-19 LAB — HEMOGLOBIN A1C: Hgb A1c MFr Bld: 7.5 % — ABNORMAL HIGH (ref 4.6–6.5)

## 2018-10-21 ENCOUNTER — Ambulatory Visit: Payer: BC Managed Care – PPO | Admitting: Endocrinology

## 2018-10-21 ENCOUNTER — Encounter: Payer: Self-pay | Admitting: Endocrinology

## 2018-10-21 ENCOUNTER — Other Ambulatory Visit: Payer: Self-pay

## 2018-10-21 VITALS — BP 120/62 | HR 63 | Ht 66.5 in | Wt 153.0 lb

## 2018-10-21 DIAGNOSIS — E1165 Type 2 diabetes mellitus with hyperglycemia: Secondary | ICD-10-CM

## 2018-10-21 NOTE — Progress Notes (Signed)
Patient ID: Andrew Hart, male   DOB: 07-Sep-1970, 49 y.o.   MRN: [id number]           Reason for Appointment: Follow-up for Type 2 Diabetes   History of Present Illness:          Date of diagnosis of type 2 diabetes mellitus: 2007 ?        Background history:  He was diagnosed with routine screening lab work He was initially tried on metformin but this caused diarrhea and he could not continue this Subsequently was treated with glipizide and apparently this was able to control his blood sugars fairly well for a few years Probably in early 2016 he was given Actos in addition to help his glucose control as A1c had gone up to 9.4 This helped transiently and A1c was 7.7 However his A1c had been otherwise over 8% since mid 2015 Because of  high blood sugars on his initial consultation in 05/2014 (A1c was 9.1) he was started on Invokana and Onglyza in addition to his Actos and glipizide.   Recent history:   Non-insulin hypoglycemic drugs currently: Glipizide ER 10 mg daily, Actos 45 mg daily and Invokana 150 mg daily, Januvia 100 mg  His A1c appears to be higher at 7.5 compared to 7%   Current blood sugar patterns and problems identified:  He does not like to check his sugars as he does not like to prick his finger  However has done some readings both morning and at night after dinner mostly  Most of his sugars in the evenings are relatively high although lowest reading was 135 and may be checked closer to bedtime rather than postprandially  Fasting readings are generally good except once  He says that he has not been very consistent with her diet and may be eating more, also trying to increase portions because of tendency to weight loss before  Has not benefited from increasing Actos to 45 mg Overall has not been as active with less work       Side effects from medications have been: Diarrhea from regular metformin  Compliance with the medical regimen: Fair Hypoglycemia: None    Glucose monitoring:  done 0-1  times a d ay         Glucometer:  Accu-Chek   Blood Glucose readings  FASTING range 93-161 average 121  Late evening 135-189 with average about 170  Dietician visit, most recent: 06/2015               Exercise: Only when he is active at work  Weight history:   Wt Readings from Last 3 Encounters:  10/21/18 153 lb (69.4 kg)  07/22/18 150 lb 3.2 oz (68.1 kg)  07/09/18 150 lb 7 oz (68.2 kg)    Glycemic control:    Lab Results  Component Value Date   HGBA1C 7.5 (H) 10/19/2018   HGBA1C 7.0 (H) 05/06/2018   HGBA1C 6.9 (H) 03/17/2018   Lab Results  Component Value Date   MICROALBUR <0.7 05/06/2018   LDLCALC 84 05/06/2018   CREATININE 0.92 10/19/2018    Lab on 10/19/2018  Component Date Value Ref Range Status  . Sodium 10/19/2018 136  135 - 145 mEq/L Final  . Potassium 10/19/2018 3.9  3.5 - 5.1 mEq/L Final  . Chloride 10/19/2018 104  96 - 112 mEq/L Final  . CO2 10/19/2018 26  19 - 32 mEq/L Final  . Glucose, Bld 10/19/2018 130* 70 - 99 mg/dL Final  .  BUN 10/19/2018 19  6 - 23 mg/dL Final  . Creatinine, Ser 10/19/2018 0.92  0.40 - 1.50 mg/dL Final  . Total Bilirubin 10/19/2018 0.5  0.2 - 1.2 mg/dL Final  . Alkaline Phosphatase 10/19/2018 76  39 - 117 U/L Final  . AST 10/19/2018 15  0 - 37 U/L Final  . ALT 10/19/2018 21  0 - 53 U/L Final  . Total Protein 10/19/2018 6.5  6.0 - 8.3 g/dL Final  . Albumin 10/19/2018 4.3  3.5 - 5.2 g/dL Final  . Calcium 10/19/2018 9.0  8.4 - 10.5 mg/dL Final  . GFR 10/19/2018 87.55  >60.00 mL/min Final  . Hgb A1c MFr Bld 10/19/2018 7.5* 4.6 - 6.5 % Final   Glycemic Control Guidelines for People with Diabetes:Non Diabetic:  <6%Goal of Therapy: <7%Additional Action Suggested:  >8%       Allergies as of 10/21/2018      Reactions   Janumet Xr [sitagliptin-metformin Hcl Er] Rash   Jardiance [empagliflozin] Rash   Metformin And Related Diarrhea   Methotrexate Derivatives Other (See Comments)   transaminitis    Other Rash   Methyldibromo Gluteronitrile - skin product preservative      Medication List       Accurate as of October 21, 2018 12:37 PM. If you have any questions, ask your nurse or doctor.        Accu-Chek Guide test strip Generic drug: glucose blood USE TO CHECK BLOOD SUGARS TWICE DAILY   Accu-Chek Guide w/Device Kit 1 kit by Does not apply route 2 (two) times daily. Use to check blood sugar twice a day   acyclovir 200 MG capsule Commonly known as: ZOVIRAX TAKE 1 CAPSULE BY MOUTH 3 TIMES DAILY FOR 3 TO 5 DAYS AS NEEDED.   aspirin 81 MG tablet Take 81 mg by mouth daily.   Bayer Microlet Lancets lancets Use as instructed to check blood sugar twice a day dx E11.65   busPIRone 10 MG tablet Commonly known as: BUSPAR TAKE 1 TABLET BY MOUTH 2 TIMES DAILY.   canagliflozin 300 MG Tabs tablet Commonly known as: Invokana Take 1 tablet by mouth once daily.   citalopram 20 MG tablet Commonly known as: CELEXA TAKE 1 TABLET BY MOUTH DAILY.   glipiZIDE 10 MG 24 hr tablet Commonly known as: GLUCOTROL XL TAKE 1 TABLET DAILY WITH BREAKFAST BY MOUTH.   hydrOXYzine 25 MG tablet Commonly known as: ATARAX/VISTARIL Take 1 tablet (25 mg total) by mouth 3 (three) times daily as needed.   Januvia 100 MG tablet Generic drug: sitaGLIPtin TAKE 1 TABLET BY MOUTH DAILY.   pantoprazole 20 MG tablet Commonly known as: PROTONIX TAKE 1 TABLET (20 MG TOTAL) BY MOUTH DAILY.   pioglitazone 45 MG tablet Commonly known as: Actos Take 1 tablet (45 mg total) by mouth daily.   simvastatin 80 MG tablet Commonly known as: ZOCOR TAKE 1 TABLET BY MOUTH DAILY.   traZODone 150 MG tablet Commonly known as: DESYREL TAKE 1/2 TABLET (75 MG TOTAL) BY MOUTH AT BEDTIME.   triamcinolone cream 0.1 % Commonly known as: KENALOG Apply to affected area twice daily. Do not apply to face.       Allergies:  Allergies  Allergen Reactions  . Janumet Xr [Sitagliptin-Metformin Hcl Er] Rash  . Jardiance  [Empagliflozin] Rash  . Metformin And Related Diarrhea  . Methotrexate Derivatives Other (See Comments)    transaminitis  . Other Rash    Methyldibromo Gluteronitrile - skin product preservative    Past  Medical History:  Diagnosis Date  . Alcohol use 11/08/2015  . Diabetes mellitus without complication ([place]) 7588   [place]ep[place]  . GERD (gastroesophageal reflux disease)   . Herpes labialis   . History of chicken pox   . Hyperlipidemia     Past Surgical History:  Procedure Laterality Date  . APPENDECTOMY    . FINGER SURGERY Right    2 screws in ring finger  . TONSILLECTOMY      Family History  Problem Relation Age of Onset  . Diabetes Mother   . CAD Father 70       MI at 80, CABG at 37  . Diabetes Maternal Grandmother   . Parkinsonism Mother 9       progressive supranuclear palsy, deceased  . Stroke Neg Hx   . Cancer Neg Hx     Social History:  reports that he has been smoking cigarettes. He has a 7.50 pack-year smoking history. He has never used smokeless tobacco. He reports current alcohol use. He reports current drug use.    Review of Systems    Lipid history:  Has been on 80 mg simvastatin for treatment, prescribed by PCP  His lipids are well controlled    Lab Results  Component Value Date   CHOL 156 05/06/2018   HDL 47.40 05/06/2018   LDLCALC 84 05/06/2018   TRIG 122.0 05/06/2018   CHOLHDL 3 05/06/2018           Most recent foot exam: 3/20  Liver functions have been normal with stopping methotrexate  Lab Results  Component Value Date   ALT 21 10/19/2018    No pain in calf muscles on walking  No microalbuminuria  Review of Systems     Physical Examination:  BP 120/62 (BP Location: Left Arm, Patient Position: Sitting, Cuff Size: Normal)   Pulse 63   Ht 5' 6.5" (1.689 m)   Wt 153 lb (69.4 kg)   SpO2 96%   BMI 24.32 kg/m     ASSESSMENT:  Diabetes type 2, non-insulin-dependent  See history of present illness for detailed discussion of  current diabetes management, blood sugar patterns and problems identified  His A1c is going up to 7.5%  He has not benefited from increasing Actos also has gained 3 pounds which he was wanting to do Blood sugars are relatively higher after his evening meals However he can do better with diet overall Recently he has been trying to be a little more active with his work Fasting readings are generally fairly good with lab glucose 130  PLAN:    Previously had been reluctant to increase Invokana but he is agreeable to trying 300 mg alternating with 150 Encouraged him to check more readings after his evening meals Also cut back on higher fat or higher carbohydrate foods and snacks Can add some brisk walking if he is not very active at work  [employer] in 3 months  There are no Patient Instructions on file for this visit.   Elayne Snare 10/21/2018, 12:37 PM   Note: This office note was prepared with Dragon voice recognition system technology. Any transcriptional errors that result from this process are unintentional.

## 2018-11-08 ENCOUNTER — Other Ambulatory Visit: Payer: Self-pay | Admitting: Endocrinology

## 2018-12-07 ENCOUNTER — Other Ambulatory Visit: Payer: Self-pay | Admitting: Family Medicine

## 2018-12-07 ENCOUNTER — Other Ambulatory Visit: Payer: Self-pay | Admitting: Endocrinology

## 2018-12-07 NOTE — Telephone Encounter (Signed)
LOV 07/09/2018 for acute visit. 01/18/18 was for CPE. No future appointments scheduled. Schedule CPE?

## 2018-12-15 ENCOUNTER — Other Ambulatory Visit: Payer: Self-pay | Admitting: Endocrinology

## 2018-12-31 ENCOUNTER — Other Ambulatory Visit: Payer: Self-pay | Admitting: Family Medicine

## 2018-12-31 NOTE — Telephone Encounter (Signed)
Pt is scheduled for 02/24/19 lab and 03/03/19 CPE  Pt wants to know if his wife can become a new patient with Dr. Danise Mina? Her name is Andrew Hart DOB 01/27/1973. I told him I would ask but was uncertain whether he would take on a new patient. He said he is concerned for his wife b/c she has been complaining of chest pains and said she has some other health concerns. Please advise.

## 2018-12-31 NOTE — Telephone Encounter (Signed)
E-scribed refill.  Plz schedule CPE and labs. 

## 2019-01-01 NOTE — Telephone Encounter (Signed)
Sure - may place in open 30 min slot, can we triage her for the chest pain too?

## 2019-01-03 NOTE — Telephone Encounter (Signed)
I copied information into Andrew Hart's chart and she is now scheduled. I sent information to triage nurses to call.

## 2019-01-17 ENCOUNTER — Other Ambulatory Visit (INDEPENDENT_AMBULATORY_CARE_PROVIDER_SITE_OTHER): Payer: BC Managed Care – PPO

## 2019-01-17 ENCOUNTER — Other Ambulatory Visit: Payer: Self-pay

## 2019-01-17 DIAGNOSIS — E1165 Type 2 diabetes mellitus with hyperglycemia: Secondary | ICD-10-CM | POA: Diagnosis not present

## 2019-01-17 LAB — COMPREHENSIVE METABOLIC PANEL
ALT: 22 U/L (ref 0–53)
AST: 15 U/L (ref 0–37)
Albumin: 4.3 g/dL (ref 3.5–5.2)
Alkaline Phosphatase: 76 U/L (ref 39–117)
BUN: 18 mg/dL (ref 6–23)
CO2: 25 mEq/L (ref 19–32)
Calcium: 8.9 mg/dL (ref 8.4–10.5)
Chloride: 106 mEq/L (ref 96–112)
Creatinine, Ser: 0.97 mg/dL (ref 0.40–1.50)
GFR: 82.28 mL/min (ref >60.00)
Glucose, Bld: 133 mg/dL — ABNORMAL HIGH (ref 70–99)
Potassium: 4.1 mEq/L (ref 3.5–5.1)
Sodium: 137 mEq/L (ref 135–145)
Total Bilirubin: 0.6 mg/dL (ref 0.2–1.2)
Total Protein: 6.5 g/dL (ref 6.0–8.3)

## 2019-01-17 LAB — LIPID PANEL
Cholesterol: 141 mg/dL (ref 0–200)
HDL: 43 mg/dL (ref >39.00)
LDL Cholesterol: 82 mg/dL (ref 0–99)
NonHDL: 98.02
Total CHOL/HDL Ratio: 3
Triglycerides: 80 mg/dL (ref 0.0–149.0)
VLDL: 16 mg/dL (ref 0.0–40.0)

## 2019-01-17 LAB — HEMOGLOBIN A1C: Hgb A1c MFr Bld: 7.8 % — ABNORMAL HIGH (ref 4.6–6.5)

## 2019-01-19 ENCOUNTER — Other Ambulatory Visit: Payer: Self-pay | Admitting: Family Medicine

## 2019-01-20 ENCOUNTER — Ambulatory Visit: Payer: BC Managed Care – PPO | Admitting: Endocrinology

## 2019-01-20 ENCOUNTER — Other Ambulatory Visit: Payer: Self-pay

## 2019-01-20 ENCOUNTER — Encounter: Payer: Self-pay | Admitting: Endocrinology

## 2019-01-20 VITALS — BP 110/80 | HR 62 | Ht 66.5 in | Wt 157.6 lb

## 2019-01-20 DIAGNOSIS — E1165 Type 2 diabetes mellitus with hyperglycemia: Secondary | ICD-10-CM

## 2019-01-20 NOTE — Patient Instructions (Signed)
Check blood sugars on waking up 2 days a week  Also check blood sugars about 2 hours after meals and do this after different meals by rotation  Recommended blood sugar levels on waking up are 90-130 and about 2 hours after meal is 130-160  Please bring your blood sugar monitor to each visit, thank you  Call in 3 weeks for Rx

## 2019-01-20 NOTE — Progress Notes (Signed)
Patient ID: Andrew Hart, male   DOB: [birth year], 48 y.o.   MRN: [id number]           Reason for Appointment: Follow-up for Type 2 Diabetes   History of Present Illness:          Date of diagnosis of type 2 diabetes mellitus: 2007 ?        Background history:  He was diagnosed with routine screening lab from his work He was initially tried on metformin but this caused diarrhea and he could not continue this Subsequently was treated with glipizide and apparently this was able to control his blood sugars fairly well for a few years Probably in early 2016 he was given Actos in addition to help his glucose control as A1c had gone up to 9.4 This helped transiently and A1c was 7.7 However his A1c had been otherwise over 8% since mid 2015 Because of  high blood sugars on his initial consultation in 05/2014 (A1c was 9.1) he was started on Invokana and Onglyza in addition to his Actos and glipizide.   Recent history:   Non-insulin hypoglycemic drugs: Glipizide ER 10 mg daily, Actos 45 mg daily and Invokana 150 mg daily, Januvia 100 mg  His A1c appears to be higher at 7.8   Current blood sugar patterns and problems identified:  He does appears to have high postprandial readings now  Although he does not check sugars very mild and only 12 times in the last month he appears to have fairly consistently high readings after his evening meal, checking mostly around 11 PM  Lab fasting glucose was 133 and his average blood sugar in the morning is 133 also  Lowest blood sugar is 80 midday and rarely may feel shaky if he does not eat 1 time  He has been wanting to gain weight and is not always controlling portions although eating times a regular  She does not think he is eating a lot of fried food and usually draining the grease from hamburgers and using the air Rolly Salter for chicken and meats  He was told to try a 150/300 alternating regimen for Invokana and he is doing this  Still concerned  about cost of most of his medications  His exercise is mostly work-related  Not doing any with walking or exercise at home       Side effects from medications have been: Diarrhea from regular metformin  Compliance with the medical regimen: Fair Hypoglycemia: None   Glucose monitoring:  done 0-1  times a d ay         Glucometer:  Accu-Chek   Blood Glucose readings   PRE-MEAL Fasting Lunch Dinner Bedtime Overall  Glucose range:  125-144    152-244   Mean/median:  133    200     Previous readings:  FASTING range 93-161 average 121  Late evening 135-189 with average about 170  Dietician visit, most recent: 06/2015               Exercise: Only when he is active at work  Weight history:   Wt Readings from Last 3 Encounters:  01/20/19 157 lb 9.6 oz (71.5 kg)  10/21/18 153 lb (69.4 kg)  07/22/18 150 lb 3.2 oz (68.1 kg)    Glycemic control:    Lab Results  Component Value Date   HGBA1C 7.8 (H) 01/17/2019   HGBA1C 7.5 (H) 10/19/2018   HGBA1C 7.0 (H) 05/06/2018   Lab Results  Component Value  Date   MICROALBUR <0.7 05/06/2018   LDLCALC 82 01/17/2019   CREATININE 0.97 01/17/2019    Lab on 01/17/2019  Component Date Value Ref Range Status  . Cholesterol 01/17/2019 141  0 - 200 mg/dL Final   ATP III Classification       Desirable:  < 200 mg/dL               Borderline High:  200 - 239 mg/dL          High:  > = 240 mg/dL  . Triglycerides 01/17/2019 80.0  0.0 - 149.0 mg/dL Final   Normal:  <150 mg/dLBorderline High:  150 - 199 mg/dL  . HDL 01/17/2019 43.00  >39.00 mg/dL Final  . VLDL 01/17/2019 16.0  0.0 - 40.0 mg/dL Final  . LDL Cholesterol 01/17/2019 82  0 - 99 mg/dL Final  . Total CHOL/HDL Ratio 01/17/2019 3   Final                  Men          Women1/2 Average Risk     3.4          3.3Average Risk          5.0          4.42X Average Risk          9.6          7.13X Average Risk          15.0          11.0                      . NonHDL 01/17/2019 98.02   Final    NOTE:  Non-HDL goal should be 30 mg/dL higher than patient's LDL goal (i.e. LDL goal of < 70 mg/dL, would have non-HDL goal of < 100 mg/dL)  . Sodium 01/17/2019 137  135 - 145 mEq/L Final  . Potassium 01/17/2019 4.1  3.5 - 5.1 mEq/L Final  . Chloride 01/17/2019 106  96 - 112 mEq/L Final  . CO2 01/17/2019 25  19 - 32 mEq/L Final  . Glucose, Bld 01/17/2019 133* 70 - 99 mg/dL Final  . BUN 01/17/2019 18  6 - 23 mg/dL Final  . Creatinine, Ser 01/17/2019 0.97  0.40 - 1.50 mg/dL Final  . Total Bilirubin 01/17/2019 0.6  0.2 - 1.2 mg/dL Final  . Alkaline Phosphatase 01/17/2019 76  39 - 117 U/L Final  . AST 01/17/2019 15  0 - 37 U/L Final  . ALT 01/17/2019 22  0 - 53 U/L Final  . Total Protein 01/17/2019 6.5  6.0 - 8.3 g/dL Final  . Albumin 01/17/2019 4.3  3.5 - 5.2 g/dL Final  . Calcium 01/17/2019 8.9  8.4 - 10.5 mg/dL Final  . GFR 01/17/2019 82.28  >60.00 mL/min Final  . Hgb A1c MFr Bld 01/17/2019 7.8* 4.6 - 6.5 % Final   Glycemic Control Guidelines for People with Diabetes:Non Diabetic:  <6%Goal of Therapy: <7%Additional Action Suggested:  >8%       Allergies as of 01/20/2019      Reactions   Janumet Xr [sitagliptin-metformin Hcl Er] Rash   Jardiance [empagliflozin] Rash   Metformin And Related Diarrhea   Methotrexate Derivatives Other (See Comments)   transaminitis   Other Rash   Methyldibromo Gluteronitrile - skin product preservative      Medication List       Accurate as of January 20, 2019 11:13 AM. If you have any questions, ask your nurse or doctor.        Accu-Chek Guide test strip Generic drug: glucose blood USE TO CHECK BLOOD SUGARS TWICE DAILY   Accu-Chek Guide w/Device Kit 1 kit by Does not apply route 2 (two) times daily. Use to check blood sugar twice a day   acyclovir 200 MG capsule Commonly known as: ZOVIRAX TAKE 1 CAPSULE BY MOUTH 3 TIMES DAILY FOR 3 TO 5 DAYS AS NEEDED.   aspirin 81 MG tablet Take 81 mg by mouth daily.   Bayer Microlet Lancets lancets  Use as instructed to check blood sugar twice a day dx E11.65   busPIRone 10 MG tablet Commonly known as: BUSPAR TAKE 1 TABLET BY MOUTH 2 TIMES DAILY.   canagliflozin 300 MG Tabs tablet Commonly known as: Invokana Take 1 tablet by mouth once daily.   citalopram 20 MG tablet Commonly known as: CELEXA TAKE 1 TABLET BY MOUTH DAILY.   glipiZIDE 10 MG 24 hr tablet Commonly known as: GLUCOTROL XL TAKE 1 TABLET DAILY WITH BREAKFAST BY MOUTH.   hydrOXYzine 25 MG tablet Commonly known as: ATARAX/VISTARIL Take 1 tablet (25 mg total) by mouth 3 (three) times daily as needed.   Januvia 100 MG tablet Generic drug: sitaGLIPtin TAKE 1 TABLET BY MOUTH DAILY.   pantoprazole 20 MG tablet Commonly known as: PROTONIX TAKE 1 TABLET BY MOUTH DAILY.   pioglitazone 45 MG tablet Commonly known as: ACTOS TAKE 1 TABLET BY MOUTH DAILY. *REPLACES 30 MG DOSE*   simvastatin 80 MG tablet Commonly known as: ZOCOR TAKE 1 TABLET BY MOUTH DAILY.   traZODone 150 MG tablet Commonly known as: DESYREL TAKE 1/2 TABLET (75 MG TOTAL) BY MOUTH AT BEDTIME.   triamcinolone cream 0.1 % Commonly known as: KENALOG Apply to affected area twice daily. Do not apply to face.       Allergies:  Allergies  Allergen Reactions  . Janumet Xr [Sitagliptin-Metformin Hcl Er] Rash  . Jardiance [Empagliflozin] Rash  . Metformin And Related Diarrhea  . Methotrexate Derivatives Other (See Comments)    transaminitis  . Other Rash    Methyldibromo Gluteronitrile - skin product preservative    Past Medical History:  Diagnosis Date  . Alcohol use 11/08/2015  . Diabetes mellitus without complication ([place]) 5397   Mariann Palo  . GERD (gastroesophageal reflux disease)   . Herpes labialis   . History of chicken pox   . Hyperlipidemia     Past Surgical History:  Procedure Laterality Date  . APPENDECTOMY    . FINGER SURGERY Right    2 screws in ring finger  . TONSILLECTOMY      Family History  Problem Relation Age of  Onset  . Diabetes Mother   . CAD Father 41       MI at 48, CABG at 61  . Diabetes Maternal Grandmother   . Parkinsonism Mother 36       progressive supranuclear palsy, deceased  . Stroke Neg Hx   . Cancer Neg Hx     Social History:  reports that he has been smoking cigarettes. He has a 7.50 pack-year smoking history. He has never used smokeless tobacco. He reports current alcohol use. He reports current drug use.    Review of Systems    Lipid history:  Has been on 80 mg simvastatin for treatment, prescribed by PCP  His lipids are well controlled    Lab Results  Component Value Date  CHOL 141 01/17/2019   HDL 43.00 01/17/2019   LDLCALC 82 01/17/2019   TRIG 80.0 01/17/2019   CHOLHDL 3 01/17/2019           Most recent foot exam: 3/20  Liver functions have been normal with stopping methotrexate  Lab Results  Component Value Date   ALT 22 01/17/2019    No pain in calf muscles on walking  No microalbuminuria  Review of Systems     Physical Examination:  BP 110/80 (BP Location: Left Arm, Patient Position: Sitting, Cuff Size: Normal)   Pulse 62   Ht 5' 6.5" (1.689 m)   Wt 157 lb 9.6 oz (71.5 kg)   SpO2 98%   BMI 25.06 kg/m     ASSESSMENT:  Diabetes type 2, non-insulin-dependent  See history of present illness for detailed discussion of current diabetes management, blood sugar patterns and problems identified  His A1c is going up to 7.8%  He likely is getting some insulin deficiency However may respond better to GLP-1 drugs compared to Januvia and he has not tried this Since he is likely not going to want to do any injectable drugs he can be tried on Rybelsus  His weight has improved  LIPIDS: Well controlled with simvastatin and will continue  PLAN:    Discussed with the patient the nature of GLP-1 drugs, the actions on insulin secretion, slowing stomach emptying, reduction of appetite and reduced liver glucose production Explained that Rybelsus  improves blood sugar control as well as produces weight loss and reduces cardiovascular events. Explained possible side effects especially nausea and vomiting that may initially; usually side effects improved with time.  Patient to call if nausea or vomiting does not improve within 2 weeks Have explained the need to take the capsules on empty stomach 30 minutes before breakfast daily.   Patient education material given along with co-pay card  He will stop Januvia for now Discussed that he may have more satiety with Rybelsus and can spread out his meals more Regular exercise To focus on trying to get evening blood sugars down He will let us know in about 3 weeks if his blood sugars are in the target range otherwise go up to 7 mg on the Rybelsus after the samples of 3 mg are finished  Follow-up in 2 months  He will need to see his dermatologist for skin rash  There are no Patient Instructions on file for this visit.   Elayne Snare 01/20/2019, 11:13 AM   Note: This office note was prepared with Dragon voice recognition system technology. Any transcriptional errors that result from this process are unintentional.

## 2019-01-28 DIAGNOSIS — L258 Unspecified contact dermatitis due to other agents: Secondary | ICD-10-CM | POA: Diagnosis not present

## 2019-01-28 DIAGNOSIS — L299 Pruritus, unspecified: Secondary | ICD-10-CM | POA: Diagnosis not present

## 2019-02-11 ENCOUNTER — Telehealth: Payer: Self-pay

## 2019-02-11 ENCOUNTER — Other Ambulatory Visit: Payer: Self-pay

## 2019-02-11 ENCOUNTER — Other Ambulatory Visit: Payer: Self-pay | Admitting: Family Medicine

## 2019-02-11 MED ORDER — RYBELSUS 7 MG PO TABS: 1.0000 | ORAL_TABLET | Freq: Every day | ORAL | 2 refills | Status: DC

## 2019-02-11 NOTE — Telephone Encounter (Signed)
As long as he is not having any nausea he can increase the Rybelsus to 7 mg every morning to get more consistent control, send prescription if needed

## 2019-02-11 NOTE — Telephone Encounter (Signed)
Called pt and gave him MD message. Pt verbalized understanding. Rx for Rybelsus 7mg  tablets sent to pharmacy.

## 2019-02-11 NOTE — Telephone Encounter (Signed)
Pt called to provide blood sugars per Dr. Ronnie Derby request after finishing a trial of Rybelsus 3mg  tablets. Pt's blood sugars over the last 7 days are as follows: 10/09- 112 fasting 10/08- 134 fasting 10/05- 188- 2218(after dinner) 10/03- 157 fasting 10/02- 163 fasting 09/29- 139- 2300 (after dinner) 09/29- 113 fasting 09/26-136 fasting  Pt would like to know if you would like him to increase to 7mg  tablets.

## 2019-02-18 DIAGNOSIS — L281 Prurigo nodularis: Secondary | ICD-10-CM | POA: Diagnosis not present

## 2019-02-18 DIAGNOSIS — L258 Unspecified contact dermatitis due to other agents: Secondary | ICD-10-CM | POA: Diagnosis not present

## 2019-02-21 DIAGNOSIS — L299 Pruritus, unspecified: Secondary | ICD-10-CM | POA: Diagnosis not present

## 2019-02-21 DIAGNOSIS — L259 Unspecified contact dermatitis, unspecified cause: Secondary | ICD-10-CM | POA: Diagnosis not present

## 2019-02-21 DIAGNOSIS — L089 Local infection of the skin and subcutaneous tissue, unspecified: Secondary | ICD-10-CM | POA: Diagnosis not present

## 2019-02-21 DIAGNOSIS — Z79899 Other long term (current) drug therapy: Secondary | ICD-10-CM | POA: Diagnosis not present

## 2019-02-21 DIAGNOSIS — L281 Prurigo nodularis: Secondary | ICD-10-CM | POA: Diagnosis not present

## 2019-02-22 ENCOUNTER — Telehealth: Payer: Self-pay

## 2019-02-22 NOTE — Telephone Encounter (Signed)
Called and left voicemail for pt requesting a callback to get more information about what type of side effects he is having from this medication.

## 2019-02-22 NOTE — Telephone Encounter (Signed)
Patient called in saying side affects were:  No energy No appetite No bowl movement Acid reflux Nauseas   please advise

## 2019-02-22 NOTE — Telephone Encounter (Signed)
Patient called in stating that he is having some side effects from medicine and would like to go back to old medication   Medication he is taking and not liking is: Semaglutide (RYBELSUS) 7 MG TABS   Please Advise

## 2019-02-22 NOTE — Telephone Encounter (Signed)
He will stop the Rybelsus for 2 days and then go back to 3 mg every morning.  May need new prescription

## 2019-02-23 ENCOUNTER — Other Ambulatory Visit: Payer: Self-pay

## 2019-02-23 ENCOUNTER — Encounter: Payer: Self-pay | Admitting: Family Medicine

## 2019-02-23 ENCOUNTER — Telehealth: Payer: Self-pay | Admitting: Endocrinology

## 2019-02-23 ENCOUNTER — Other Ambulatory Visit: Payer: Self-pay | Admitting: Family Medicine

## 2019-02-23 DIAGNOSIS — E785 Hyperlipidemia, unspecified: Secondary | ICD-10-CM

## 2019-02-23 DIAGNOSIS — E1165 Type 2 diabetes mellitus with hyperglycemia: Secondary | ICD-10-CM

## 2019-02-23 DIAGNOSIS — Z125 Encounter for screening for malignant neoplasm of prostate: Secondary | ICD-10-CM

## 2019-02-23 MED ORDER — SITAGLIPTIN PHOSPHATE 100 MG PO TABS: 100.0000 mg | ORAL_TABLET | Freq: Every day | ORAL | 3 refills | Status: DC

## 2019-02-23 MED ORDER — CANAGLIFLOZIN 300 MG PO TABS: ORAL_TABLET | ORAL | 4 refills | Status: DC

## 2019-02-23 NOTE — Telephone Encounter (Signed)
Called pt and left detailed voicemail with MD message as well as a request for the pt to return my call to inform this office if this is what he would prefer to do so the appropriate prescriptions can be sent to his pharmacy.

## 2019-02-23 NOTE — Telephone Encounter (Signed)
Called pt and gave him MD message. Pt did verbalize understanding, but stated that he would think about resuming the medication. He states that he does not like the nausea and how much it is suppressing his appetite.  Pt stated that the nausea existed with the 3mg  tablet, but he just fought through it.

## 2019-02-23 NOTE — Telephone Encounter (Signed)
If he does not take the 3 mg Rybelsus he will go back to Januvia and also increase his Invokana to the whole tablet of 300 mg

## 2019-02-23 NOTE — Telephone Encounter (Signed)
Do you want pt back on Januvia 100mg  QD?

## 2019-02-23 NOTE — Telephone Encounter (Signed)
Needs to resume 100 mg Januvia daily

## 2019-02-23 NOTE — Telephone Encounter (Signed)
Patient ph# [phone] called re: patient states he is going to go back on Januvia and take a full tablet of Invokana. Patient letting dr. Dwyane Dee know the above information.

## 2019-02-23 NOTE — Telephone Encounter (Signed)
Rx sent 

## 2019-02-24 ENCOUNTER — Other Ambulatory Visit: Payer: BC Managed Care – PPO

## 2019-02-25 ENCOUNTER — Encounter: Payer: Self-pay | Admitting: Family Medicine

## 2019-03-02 ENCOUNTER — Other Ambulatory Visit: Payer: Self-pay | Admitting: Family Medicine

## 2019-03-03 ENCOUNTER — Encounter: Payer: BC Managed Care – PPO | Admitting: Family Medicine

## 2019-03-07 ENCOUNTER — Other Ambulatory Visit (INDEPENDENT_AMBULATORY_CARE_PROVIDER_SITE_OTHER): Payer: BC Managed Care – PPO

## 2019-03-07 DIAGNOSIS — E785 Hyperlipidemia, unspecified: Secondary | ICD-10-CM

## 2019-03-07 DIAGNOSIS — E1165 Type 2 diabetes mellitus with hyperglycemia: Secondary | ICD-10-CM

## 2019-03-07 LAB — LIPID PANEL
Cholesterol: 155 mg/dL (ref 0–200)
HDL: 38.4 mg/dL — ABNORMAL LOW (ref >39.00)
LDL Cholesterol: 86 mg/dL (ref 0–99)
NonHDL: 116.89
Total CHOL/HDL Ratio: 4
Triglycerides: 152 mg/dL — ABNORMAL HIGH (ref 0.0–149.0)
VLDL: 30.4 mg/dL (ref 0.0–40.0)

## 2019-03-07 LAB — COMPREHENSIVE METABOLIC PANEL
ALT: 15 U/L (ref 0–53)
AST: 14 U/L (ref 0–37)
Albumin: 4.4 g/dL (ref 3.5–5.2)
Alkaline Phosphatase: 86 U/L (ref 39–117)
BUN: 19 mg/dL (ref 6–23)
CO2: 27 mEq/L (ref 19–32)
Calcium: 9.1 mg/dL (ref 8.4–10.5)
Chloride: 103 mEq/L (ref 96–112)
Creatinine, Ser: 1.04 mg/dL (ref 0.40–1.50)
GFR: 75.88 mL/min (ref >60.00)
Glucose, Bld: 210 mg/dL — ABNORMAL HIGH (ref 70–99)
Potassium: 3.9 mEq/L (ref 3.5–5.1)
Sodium: 137 mEq/L (ref 135–145)
Total Bilirubin: 0.5 mg/dL (ref 0.2–1.2)
Total Protein: 6.8 g/dL (ref 6.0–8.3)

## 2019-03-07 LAB — MICROALBUMIN / CREATININE URINE RATIO
Creatinine,U: 63.6 mg/dL
Microalb Creat Ratio: 1.1 mg/g (ref 0.0–30.0)
Microalb, Ur: <0.7 mg/dL (ref 0.0–1.9)

## 2019-03-14 ENCOUNTER — Encounter: Payer: Self-pay | Admitting: Family Medicine

## 2019-03-14 ENCOUNTER — Other Ambulatory Visit: Payer: Self-pay

## 2019-03-14 ENCOUNTER — Ambulatory Visit (INDEPENDENT_AMBULATORY_CARE_PROVIDER_SITE_OTHER): Payer: BC Managed Care – PPO | Admitting: Family Medicine

## 2019-03-14 VITALS — BP 118/80 | HR 69 | Temp 98.0°F | Ht 66.25 in | Wt 153.2 lb

## 2019-03-14 DIAGNOSIS — Z23 Encounter for immunization: Secondary | ICD-10-CM | POA: Diagnosis not present

## 2019-03-14 DIAGNOSIS — F4322 Adjustment disorder with anxiety: Secondary | ICD-10-CM | POA: Diagnosis not present

## 2019-03-14 DIAGNOSIS — I714 Abdominal aortic aneurysm, without rupture, unspecified: Secondary | ICD-10-CM

## 2019-03-14 DIAGNOSIS — F17200 Nicotine dependence, unspecified, uncomplicated: Secondary | ICD-10-CM

## 2019-03-14 DIAGNOSIS — E1165 Type 2 diabetes mellitus with hyperglycemia: Secondary | ICD-10-CM

## 2019-03-14 DIAGNOSIS — Z0000 Encounter for general adult medical examination without abnormal findings: Secondary | ICD-10-CM

## 2019-03-14 DIAGNOSIS — G4700 Insomnia, unspecified: Secondary | ICD-10-CM

## 2019-03-14 DIAGNOSIS — Z87898 Personal history of other specified conditions: Secondary | ICD-10-CM

## 2019-03-14 DIAGNOSIS — E1169 Type 2 diabetes mellitus with other specified complication: Secondary | ICD-10-CM

## 2019-03-14 DIAGNOSIS — E785 Hyperlipidemia, unspecified: Secondary | ICD-10-CM

## 2019-03-14 DIAGNOSIS — L281 Prurigo nodularis: Secondary | ICD-10-CM

## 2019-03-14 DIAGNOSIS — Z8249 Family history of ischemic heart disease and other diseases of the circulatory system: Secondary | ICD-10-CM

## 2019-03-14 LAB — POC URINALSYSI DIPSTICK (AUTOMATED)
Bilirubin, UA: NEGATIVE
Blood, UA: NEGATIVE
Glucose, UA: POSITIVE — AB
Ketones, UA: NEGATIVE
Leukocytes, UA: NEGATIVE
Nitrite, UA: NEGATIVE
Protein, UA: NEGATIVE
Spec Grav, UA: 1.01 (ref 1.010–1.025)
Urobilinogen, UA: 0.2 E.U./dL
pH, UA: 5.5 (ref 5.0–8.0)

## 2019-03-14 MED ORDER — DOXEPIN HCL 50 MG PO CAPS: 50.0000 mg | ORAL_CAPSULE | Freq: Every day | ORAL | 6 refills | Status: DC

## 2019-03-14 MED ORDER — ASPIRIN EC 81 MG PO TBEC: 81.0000 mg | DELAYED_RELEASE_TABLET | ORAL | Status: DC

## 2019-03-14 NOTE — Assessment & Plan Note (Addendum)
Preventative protocols reviewed and updated unless pt declined. Discussed healthy diet and lifestyle.  Discussed new USPSTF colon cancer screening recommendations - suggested he call insurance to see if colonoscopy will be covered this year. Father with colon polyps at later age

## 2019-03-14 NOTE — Assessment & Plan Note (Signed)
Trazodone 75mg  nightly effective. Will trial doxepin due to ongoing pruritic skin rash.

## 2019-03-14 NOTE — Assessment & Plan Note (Signed)
Chronic, stable period on buspar and celexa. See below for trazodone change.

## 2019-03-14 NOTE — Assessment & Plan Note (Addendum)
Ongoing 1/2 ppd use. Encouraged smoking cessation.

## 2019-03-14 NOTE — Patient Instructions (Addendum)
Flu shot today Urinalysis today GAD7 questionairre today Decrease aspirin to 81mg  3 days a week.  Trial doxepin 50mg  in place of trazodone.  Check with insurance to see if they would cover screening colonoscopy at age 49 given new (USPSTF) screening guidelines.  Good to see you today, call us with questions.  Return as needed or in 1 year for next physical.   Health Maintenance, Male Adopting a healthy lifestyle and getting preventive care are important in promoting health and wellness. Ask your health care provider about:  The right schedule for you to have regular tests and exams.  Things you can do on your own to prevent diseases and keep yourself healthy. What should I know about diet, weight, and exercise? Eat a healthy diet   Eat a diet that includes plenty of vegetables, fruits, low-fat dairy products, and lean protein.  Do not eat a lot of foods that are high in solid fats, added sugars, or sodium. Maintain a healthy weight Body mass index (BMI) is a measurement that can be used to identify possible weight problems. It estimates body fat based on height and weight. Your health care provider can help determine your BMI and help you achieve or maintain a healthy weight. Get regular exercise Get regular exercise. This is one of the most important things you can do for your health. Most adults should:  Exercise for at least 150 minutes each week. The exercise should increase your heart rate and make you sweat (moderate-intensity exercise).  Do strengthening exercises at least twice a week. This is in addition to the moderate-intensity exercise.  Spend less time sitting. Even light physical activity can be beneficial. Watch cholesterol and blood lipids Have your blood tested for lipids and cholesterol at 49 years of age, then have this test every 5 years. You may need to have your cholesterol levels checked more often if:  Your lipid or cholesterol levels are high.  You are  older than 49 years of age.  You are at high risk for heart disease. What should I know about cancer screening? Many types of cancers can be detected early and may often be prevented. Depending on your health history and family history, you may need to have cancer screening at various ages. This may include screening for:  Colorectal cancer.  Prostate cancer.  Skin cancer.  Lung cancer. What should I know about heart disease, diabetes, and high blood pressure? Blood pressure and heart disease  High blood pressure causes heart disease and increases the risk of stroke. This is more likely to develop in people who have high blood pressure readings, are of African descent, or are overweight.  Talk with your health care provider about your target blood pressure readings.  Have your blood pressure checked: ? Every 3-5 years if you are 14-18 years of age. ? Every year if you are 37 years old or older.  If you are between the ages of 8 and 74 and are a current or former smoker, ask your health care provider if you should have a one-time screening for abdominal aortic aneurysm (AAA). Diabetes Have regular diabetes screenings. This checks your fasting blood sugar level. Have the screening done:  Once every three years after age 25 if you are at a normal weight and have a low risk for diabetes.  More often and at a younger age if you are overweight or have a high risk for diabetes. What should I know about preventing infection? Hepatitis B If  you have a higher risk for hepatitis B, you should be screened for this virus. Talk with your health care provider to find out if you are at risk for hepatitis B infection. Hepatitis C Blood testing is recommended for:  Everyone born from [birth year] through 1966.  Anyone with known risk factors for hepatitis C. Sexually transmitted infections (STIs)  You should be screened each year for STIs, including gonorrhea and chlamydia, if: ? You are sexually  active and are younger than 49 years of age. ? You are older than 49 years of age and your health care provider tells you that you are at risk for this type of infection. ? Your sexual activity has changed since you were last screened, and you are at increased risk for chlamydia or gonorrhea. Ask your health care provider if you are at risk.  Ask your health care provider about whether you are at high risk for HIV. Your health care provider may recommend a prescription medicine to help prevent HIV infection. If you choose to take medicine to prevent HIV, you should first get tested for HIV. You should then be tested every 3 months for as long as you are taking the medicine. Follow these instructions at home: Lifestyle  Do not use any products that contain nicotine or tobacco, such as cigarettes, e-cigarettes, and chewing tobacco. If you need help quitting, ask your health care provider.  Do not use street drugs.  Do not share needles.  Ask your health care provider for help if you need support or information about quitting drugs. Alcohol use  Do not drink alcohol if your health care provider tells you not to drink.  If you drink alcohol: ? Limit how much you have to 0-2 drinks a day. ? Be aware of how much alcohol is in your drink. In the [place] one drink equals one 12 oz bottle of beer (355 mL), one 5 oz glass of wine (148 mL), or one 1 oz glass of hard liquor (44 mL). General instructions  Schedule regular health, dental, and eye exams.  Stay current with your vaccines.  Tell your health care provider if: ? You often feel depressed. ? You have ever been abused or do not feel safe at home. Summary  Adopting a healthy lifestyle and getting preventive care are important in promoting health and wellness.  Follow your health care provider's instructions about healthy diet, exercising, and getting tested or screened for diseases.  Follow your health care provider's instructions on  monitoring your cholesterol and blood pressure. This information is not intended to replace advice given to you by your health care provider. Make sure you discuss any questions you have with your health care provider. Document Released: 10/18/2007 Document Revised: 04/14/2018 Document Reviewed: 04/14/2018 Elsevier Patient Education  2020 [employer].

## 2019-03-14 NOTE — Assessment & Plan Note (Signed)
Remains abstinent 

## 2019-03-14 NOTE — Assessment & Plan Note (Signed)
Chronic, stable. Continue simvastatin. The 10-year ASCVD risk score Mikey Bussing DC Brooke Bonito., et al., 2013) is: 10.8%   Values used to calculate the score:     Age: 49 years     Sex: Male     Is Non-Hispanic African American: No     Diabetic: Yes     Tobacco smoker: Yes     Systolic Blood Pressure: 644 mmHg     Is BP treated: No     HDL Cholesterol: 38.4 mg/dL     Total Cholesterol: 155 mg/dL

## 2019-03-14 NOTE — Assessment & Plan Note (Signed)
Continue to encourage smoking cessation. Decrease aspirin 81mg  to MWF due to easy bruising noted. Continue high dose simvastatin.

## 2019-03-14 NOTE — Assessment & Plan Note (Addendum)
Appreciate derm care.  Trial nightly doxepin for itching.  Continue hydroxyzine PRN - discussed not using both together given anticholinergic properties.

## 2019-03-14 NOTE — Assessment & Plan Note (Signed)
Will order abd aortic US to monitor.

## 2019-03-14 NOTE — Assessment & Plan Note (Addendum)
Appreciate endo care. Continue current regimen.  Check UA today in actos use.

## 2019-03-14 NOTE — Progress Notes (Signed)
This visit was conducted in person.  BP 118/80 (BP Location: Left Arm, Patient Position: Sitting, Cuff Size: Normal)   Pulse 69   Temp 98 F (36.7 C) (Temporal)   Ht 5' 6.25" (1.683 m)   Wt 153 lb 3 oz (69.5 kg)   SpO2 96%   BMI 24.54 kg/m    CC: CPE Subjective:    Patient ID: Andrew Hart, male    DOB: 11/05/1970, 48 y.o.   MRN: [id number]  HPI: Andrew Hart is a 48 y.o. male presenting on 03/14/2019 for Annual Exam   Known prurigo nodularis s/p triamcinolone injections into lesions with benefit, as well as contact dermatitis followed by [facility] dermatologist Dr Nathaniel Man last seen 02/2019. Also recommended community light therapy PRN. Previously on methotrexate which was discontinued due to transaminitis. Previous biopsy by derm showed contact dermatitis reactive to Methyldibromo-gluteronitrile preservative - now following fragrance free regimen. Using Healing moisturizing cream by [employer] with benefit.  DM followed by Dr Dwyane Dee. On invokana, actos, januvia, glipizide XL.  Lab Results  Component Value Date   HGBA1C 7.8 (H) 01/17/2019   Some L elbow pain.  Some sacral pain over last 6 months. Denies inciting trauma/injury.  Doing well regarding mood on celexa, buspar, trazodone for sleep.   Preventative: Colon cancer screening - discussed new USPSTF recommendations  Flu shot yearly Pneumovax 2016 Tdap 2014 Seat belt use discussed Sunscreen use discussed. No changing moles on skin. Smoking - 1/2 ppd x 30 yrs  Alcohol - stopped since 08/16/2017 - has rededicated himself to the [employer] - stopped since 08/16/2017 Dentist Q6 mo Eye exam - yearly - due  Lives with wife and 2 children (daughter 2003, son 2006)  Occ: owns Education administrator business  Activity: active at work, at home on yard  Diet: some water, fruits/vegetables daily, lots of soda and gatorade     Relevant past medical, surgical, family and social history reviewed and updated as indicated. Interim medical  history since our last visit reviewed. Allergies and medications reviewed and updated. Outpatient Medications Prior to Visit  Medication Sig Dispense Refill  . ACCU-CHEK GUIDE test strip USE TO CHECK BLOOD SUGARS TWICE DAILY 100 each 3  . acyclovir (ZOVIRAX) 200 MG capsule TAKE 1 CAPSULE BY MOUTH 3 TIMES DAILY FOR 3 TO 5 DAYS AS NEEDED. 30 capsule 3  . BAYER MICROLET LANCETS lancets Use as instructed to check blood sugar twice a day dx E11.65 100 each 3  . Blood Glucose Monitoring Suppl (ACCU-CHEK GUIDE) w/Device KIT 1 kit by Does not apply route 2 (two) times daily. Use to check blood sugar twice a day 1 kit 2  . busPIRone (BUSPAR) 10 MG tablet TAKE 1 TABLET BY MOUTH 2 TIMES DAILY. 180 tablet 0  . canagliflozin (INVOKANA) 300 MG TABS tablet Take 1 tablet by mouth once daily. 90 tablet 4  . citalopram (CELEXA) 20 MG tablet TAKE 1 TABLET BY MOUTH DAILY. 30 tablet 0  . glipiZIDE (GLUCOTROL XL) 10 MG 24 hr tablet TAKE 1 TABLET DAILY WITH BREAKFAST BY MOUTH. 30 tablet 3  . halobetasol (ULTRAVATE) 0.05 % cream Apply topically 2 (two) times daily. Apply to red, itchy bumps twice daily. Can cover with Saran wrap fo r30 min.  DO NOT apply to face/groin.    . hydrOXYzine (ATARAX/VISTARIL) 25 MG tablet Take 1 tablet (25 mg total) by mouth 3 (three) times daily as needed.  0  . pantoprazole (PROTONIX) 20 MG tablet TAKE 1 TABLET BY MOUTH  DAILY. 90 tablet 1  . pioglitazone (ACTOS) 45 MG tablet TAKE 1 TABLET BY MOUTH DAILY. *REPLACES 30 MG DOSE* 30 tablet 3  . simvastatin (ZOCOR) 80 MG tablet TAKE 1 TABLET BY MOUTH DAILY. 30 tablet 1  . sitaGLIPtin (JANUVIA) 100 MG tablet Take 1 tablet (100 mg total) by mouth daily. 30 tablet 3  . traZODone (DESYREL) 150 MG tablet TAKE 1/2 TABLET (75 MG TOTAL) BY MOUTH AT BEDTIME. 45 tablet 3  . aspirin 81 MG tablet Take 81 mg by mouth daily.    . sitaGLIPtin (JANUVIA) 100 MG tablet Take 100 mg by mouth daily. Take 1 tablet by mouth once daily.    Marland Kitchen triamcinolone cream  (KENALOG) 0.1 % Apply to affected area twice daily. Do not apply to face.     No facility-administered medications prior to visit.      Per HPI unless specifically indicated in ROS section below Review of Systems  Constitutional: Negative for activity change, appetite change, chills, fatigue, fever and unexpected weight change.  HENT: Negative for hearing loss.   Eyes: Negative for visual disturbance.  Respiratory: Positive for cough ("smoker's cough"). Negative for chest tightness, shortness of breath and wheezing.   Cardiovascular: Negative for chest pain, palpitations and leg swelling.  Gastrointestinal: Negative for abdominal distention, abdominal pain, blood in stool, constipation, diarrhea, nausea and vomiting.  Genitourinary: Negative for difficulty urinating and hematuria.  Musculoskeletal: Negative for arthralgias, myalgias and neck pain.  Skin: Negative for rash.  Neurological: Negative for dizziness, seizures, syncope and headaches.  Hematological: Negative for adenopathy. Bruises/bleeds easily.  Psychiatric/Behavioral: Negative for dysphoric mood. The patient is not nervous/anxious.    Objective:    BP 118/80 (BP Location: Left Arm, Patient Position: Sitting, Cuff Size: Normal)   Pulse 69   Temp 98 F (36.7 C) (Temporal)   Ht 5' 6.25" (1.683 m)   Wt 153 lb 3 oz (69.5 kg)   SpO2 96%   BMI 24.54 kg/m   Wt Readings from Last 3 Encounters:  03/14/19 153 lb 3 oz (69.5 kg)  01/20/19 157 lb 9.6 oz (71.5 kg)  10/21/18 153 lb (69.4 kg)    Physical Exam Vitals signs and nursing note reviewed.  Constitutional:      General: He is not in acute distress.    Appearance: Normal appearance. He is well-developed. He is not ill-appearing.  HENT:     Head: Normocephalic and atraumatic.     Right Ear: Hearing, tympanic membrane, ear canal and external ear normal.     Left Ear: Hearing, tympanic membrane, ear canal and external ear normal.     Nose: Nose normal.     Mouth/Throat:      Mouth: Mucous membranes are moist.     Pharynx: Oropharynx is clear. Uvula midline. No posterior oropharyngeal erythema.  Eyes:     General: No scleral icterus.    Extraocular Movements: Extraocular movements intact.     Conjunctiva/sclera: Conjunctivae normal.     Pupils: Pupils are equal, round, and reactive to light.  Neck:     Musculoskeletal: Normal range of motion and neck supple.  Cardiovascular:     Rate and Rhythm: Normal rate and regular rhythm.     Pulses: Normal pulses.          Radial pulses are 2+ on the right side and 2+ on the left side.     Heart sounds: Normal heart sounds. No murmur.  Pulmonary:     Effort: Pulmonary effort is normal. No  respiratory distress.     Breath sounds: Normal breath sounds. No wheezing, rhonchi or rales.  Abdominal:     General: Abdomen is flat. Bowel sounds are normal. There is no distension.     Palpations: Abdomen is soft. There is no mass.     Tenderness: There is no abdominal tenderness. There is no guarding or rebound.     Hernia: No hernia is present.  Musculoskeletal: Normal range of motion.     Right lower leg: No edema.     Left lower leg: No edema.  Lymphadenopathy:     Cervical: No cervical adenopathy.  Skin:    General: Skin is warm and dry.     Findings: No rash.  Neurological:     General: No focal deficit present.     Mental Status: He is alert and oriented to person, place, and time.     Comments: CN grossly intact, station and gait intact  Psychiatric:        Mood and Affect: Mood normal.        Behavior: Behavior normal.        Thought Content: Thought content normal.        Judgment: Judgment normal.       Results for orders placed or performed in visit on 03/14/19  POCT Urinalysis Dipstick (Automated)  Result Value Ref Range   Color, UA yellow    Clarity, UA clear    Glucose, UA Positive (A) Negative   Bilirubin, UA negative    Ketones, UA negative    Spec Grav, UA 1.010 1.010 - 1.025   Blood, UA  negative    pH, UA 5.5 5.0 - 8.0   Protein, UA Negative Negative   Urobilinogen, UA 0.2 0.2 or 1.0 E.U./dL   Nitrite, UA negative    Leukocytes, UA Negative Negative   Depression screen [facility] 2/9 03/14/2019 01/18/2018 07/30/2017 06/25/2017 05/28/2017  Decreased Interest 0 0 _0 Down, Depressed, Hopeless 0 0 _1 PHQ - 2 Score 0 0 _2 Altered sleeping - - _3 Tired, decreased energy - - _4 Change in appetite - - _5 Feeling bad or failure about yourself  - - _6 Trouble concentrating - - _7 Moving slowly or fidgety/restless - - 0 1 1  Suicidal thoughts - - 0 0 1  PHQ-9 Score - - _8 Difficult doing work/chores - - - - -   GAD 7 : Generalized Anxiety Score 07/30/2017 06/25/2017 05/28/2017 08/06/2015  Nervous, Anxious, on Edge _9 Control/stop worrying _10 Worry too much - different things _11 Trouble relaxing _12 Restless _13 Easily annoyed or irritable _14 Afraid - awful might happen _15 Total GAD 7 Score _16 Assessment & Plan:   Problem List Items Addressed This Visit    Uncontrolled type 2 diabetes mellitus with hyperglycemia, without long-term current use of insulin (Home)    Appreciate endo care. Continue current regimen.  Check UA today in actos use.       Relevant Medications   aspirin EC 81 MG tablet   Other Relevant Orders   POCT Urinalysis Dipstick (Automated) (Completed)   Smoker    Ongoing 1/2 ppd use. Encouraged  smoking cessation.       Prurigo nodularis    Appreciate derm care.  Trial nightly doxepin for itching.  Continue hydroxyzine PRN - discussed not using both together given anticholinergic properties.       Insomnia    Trazodone 40m nightly effective. Will trial doxepin due to ongoing pruritic skin rash.       Hyperlipidemia associated with type 2 diabetes mellitus (HCC)    Chronic, stable. Continue simvastatin. The 10-year ASCVD risk score (Mikey BussingDC JBrooke Bonito, et al., 2013) is: 10.8%    Values used to calculate the score:     Age: [age]years     Sex: Male     Is Non-Hispanic African American: No     Diabetic: Yes     Tobacco smoker: Yes     Systolic Blood Pressure: 1751mmHg     Is BP treated: No     HDL Cholesterol: 38.4 mg/dL     Total Cholesterol: 155 mg/dL       Relevant Medications   aspirin EC 81 MG tablet   History of alcohol use    Remains abstinent.       Health maintenance examination - Primary    Preventative protocols reviewed and updated unless pt declined. Discussed healthy diet and lifestyle.  Discussed new USPSTF colon cancer screening recommendations - suggested he call insurance to see if colonoscopy will be covered this year. Father with colon polyps at later age      Family history of premature CAD    Continue to encourage smoking cessation. Decrease aspirin 815mto MWF due to easy bruising noted. Continue high dose simvastatin.       Adjustment disorder with anxiety    Chronic, stable period on buspar and celexa. See below for trazodone change.       AAA (abdominal aortic aneurysm) without rupture [place]   Will order abd aortic [place] monitor.       Relevant Medications   aspirin EC 81 MG tablet   Other Relevant Orders   VAS [place] DUPLEX    Other Visit Diagnoses    Need for influenza vaccination       Relevant Orders   Flu Vaccine QUAD 36+ mos IM (Completed)       Meds ordered this encounter  Medications  . aspirin EC 81 MG tablet    Sig: Take 1 tablet (81 mg total) by mouth every Monday, Wednesday, and Friday.  . Marland Kitchenoxepin (SINEQUAN) 50 MG capsule    Sig: Take 1 capsule (50 mg total) by mouth at bedtime.    Dispense:  30 capsule    Refill:  6    In place of trazodone   Orders Placed This Encounter  Procedures  . Flu Vaccine QUAD 36+ mos IM  . POCT Urinalysis Dipstick (Automated)    Patient instructions: Flu shot today Urinalysis today GAD7 questionairre today  Decrease aspirin to 8177m days a week.  Trial  doxepin 79m4m place of trazodone.  Check with insurance to see if they would cover screening colonoscopy at age 61 g34en new (USPSTF) screening guidelines.  Good to see you today, call us wKoreah questions.  Return as needed or in 1 year for next physical.   Follow up plan: Return in about 1 year (around 03/13/2020) for annual exam, prior fasting for blood work.  JaviRia Bush

## 2019-03-15 ENCOUNTER — Telehealth: Payer: Self-pay

## 2019-03-15 NOTE — Telephone Encounter (Signed)
Patient called in wanting to know if he needed to keep his appt with Dr or labs being that he is no longer taking the medication     Please advise

## 2019-03-15 NOTE — Telephone Encounter (Signed)
Please confirm that he is talking about Rybelsus.  If his blood sugars are well controlled he can wait another month to come back.  Needs to make sure he checks his sugars at least daily at different times before his next visit

## 2019-03-16 NOTE — Telephone Encounter (Signed)
Called pt and left detailed voicemail with MD message. 

## 2019-03-17 ENCOUNTER — Other Ambulatory Visit: Payer: BC Managed Care – PPO

## 2019-03-21 ENCOUNTER — Other Ambulatory Visit: Payer: Self-pay | Admitting: Family Medicine

## 2019-03-22 ENCOUNTER — Ambulatory Visit: Payer: BC Managed Care – PPO | Admitting: Endocrinology

## 2019-03-25 ENCOUNTER — Ambulatory Visit (HOSPITAL_COMMUNITY)
Admission: RE | Admit: 2019-03-25 | Discharge: 2019-03-25 | Disposition: A | Discharge status: Home / Self Care | Payer: BC Managed Care – PPO | Source: Ambulatory Visit | Attending: Cardiology | Admitting: Cardiology

## 2019-03-25 ENCOUNTER — Other Ambulatory Visit: Payer: Self-pay

## 2019-03-25 DIAGNOSIS — I714 Abdominal aortic aneurysm, without rupture, unspecified: Secondary | ICD-10-CM

## 2019-03-29 ENCOUNTER — Encounter: Payer: Self-pay | Admitting: Family Medicine

## 2019-03-29 ENCOUNTER — Other Ambulatory Visit: Payer: Self-pay | Admitting: Family Medicine

## 2019-03-29 DIAGNOSIS — E1165 Type 2 diabetes mellitus with hyperglycemia: Secondary | ICD-10-CM

## 2019-03-29 DIAGNOSIS — I700 Atherosclerosis of aorta: Secondary | ICD-10-CM

## 2019-03-29 DIAGNOSIS — I77811 Abdominal aortic ectasia: Secondary | ICD-10-CM

## 2019-03-29 DIAGNOSIS — I708 Atherosclerosis of other arteries: Secondary | ICD-10-CM | POA: Insufficient documentation

## 2019-04-14 ENCOUNTER — Ambulatory Visit: Payer: BC Managed Care – PPO | Admitting: Vascular Surgery

## 2019-04-14 ENCOUNTER — Encounter: Payer: Self-pay | Admitting: Vascular Surgery

## 2019-04-14 ENCOUNTER — Other Ambulatory Visit: Payer: Self-pay

## 2019-04-14 VITALS — BP 133/79 | HR 65 | Temp 98.1°F | Resp 20 | Ht 66.5 in | Wt 161.0 lb

## 2019-04-14 DIAGNOSIS — I739 Peripheral vascular disease, unspecified: Secondary | ICD-10-CM | POA: Diagnosis not present

## 2019-04-14 DIAGNOSIS — I714 Abdominal aortic aneurysm, without rupture, unspecified: Secondary | ICD-10-CM

## 2019-04-14 NOTE — Progress Notes (Signed)
Referring Physician: Dr Danise Mina  Patient name: Andrew Hart MRN: [id number] DOB: [birth year] Sex: male  REASON FOR CONSULT: AAA  HPI: Andrew Hart is a 48 y.o. male, sent for evaluation of abdominal aortic aneurysm.  He had an aortic ultrasound performed in April 2019 which showed a 3.6 cm abdominal aortic aneurysm.  This was an ultrasound done for weight loss.  He subsequently had a repeat ultrasound March 26, 2019 which showed the aortic diameter was 3.2 cm.  He does have a family history of abdominal aortic aneurysm in his father.  His father had his aneurysm repaired in his 4s.  He does not have any abdominal or back pain.  However, he does have pain in his left hip and buttocks region after walking about 30 minutes.  He does not really complain of calf claudication.  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  Patient is on statin and an aspirin.  Other medical problems include diabetes hyperlipidemia both of which are currently stable.  I did discuss smoking cessation with him today.  Past Medical History:  Diagnosis Date  . Alcohol use 11/08/2015  . Diabetes mellitus without complication ([place]) 3818   Kumar  . GERD (gastroesophageal reflux disease)   . Herpes labialis   . History of chicken pox   . Hyperlipidemia    Past Surgical History:  Procedure Laterality Date  . APPENDECTOMY    . FINGER SURGERY Right    2 screws in ring finger  . TONSILLECTOMY      Family History  Problem Relation Age of Onset  . Diabetes Mother   . Parkinsonism Mother 74       progressive supranuclear palsy, deceased  . CAD Father 27       MI at 80, CABG at 59  . Diabetes Maternal Grandmother   . Stroke Neg Hx   . Cancer Neg Hx     SOCIAL HISTORY: Social History   Socioeconomic History  . Marital status: Married    Spouse name: Not on file  . Number of children: Not on file  . Years of education: Not on file  . Highest education level: Not on file  Occupational  History  . Not on file  Tobacco Use  . Smoking status: Current Every Day Smoker    Packs/day: 0.50    Years: 15.00    Pack years: 7.50    Types: Cigarettes    Last attempt to quit: 12/04/2014    Years since quitting: 4.3  . Smokeless tobacco: Never Used  Substance and Sexual Activity  . Alcohol use: Yes    Alcohol/week: 0.0 standard drinks    Comment: Regular beer (12 pk/wkend)  . Drug use: Yes    Comment: MJ (occasional)  . Sexual activity: Not on file  Other Topics Concern  . Not on file  Social History Narrative   Lives with wife and 2 children (daughter 2003, son 75)    Occ: owns Education administrator business    Activity: active at work, at home on yard   Diet: some water, fruits/vegetables daily    Social Determinants of Radio broadcast assistant Strain:   . Difficulty of Paying Living Expenses: Not on file  Food Insecurity:   . Worried About Charity fundraiser in the Last Year: Not on file  . Ran Out of Food in the Last Year: Not on file  Transportation Needs:   . Lack of Transportation (Medical): Not on file  .  Lack of Transportation (Non-Medical): Not on file  Physical Activity:   . Days of Exercise per Week: Not on file  . Minutes of Exercise per Session: Not on file  Stress:   . Feeling of Stress : Not on file  Social Connections:   . Frequency of Communication with Friends and Family: Not on file  . Frequency of Social Gatherings with Friends and Family: Not on file  . Attends Religious Services: Not on file  . Active Member of Clubs or Organizations: Not on file  . Attends Archivist Meetings: Not on file  . Marital Status: Not on file  Intimate Partner Violence:   . Fear of Current or Ex-Partner: Not on file  . Emotionally Abused: Not on file  . Physically Abused: Not on file  . Sexually Abused: Not on file    Allergies  Allergen Reactions  . Janumet Xr [Sitagliptin-Metformin Hcl Er] Rash  . Jardiance [Empagliflozin] Rash  . Metformin And  Related Diarrhea  . Methotrexate Derivatives Other (See Comments)    transaminitis  . Other Rash    Methyldibromo Gluteronitrile - skin product preservative    Current Outpatient Medications  Medication Sig Dispense Refill  . ACCU-CHEK GUIDE test strip USE TO CHECK BLOOD SUGARS TWICE DAILY 100 each 3  . acyclovir (ZOVIRAX) 200 MG capsule TAKE 1 CAPSULE BY MOUTH 3 TIMES DAILY FOR 3 TO 5 DAYS AS NEEDED. 30 capsule 3  . aspirin EC 81 MG tablet Take 1 tablet (81 mg total) by mouth every Monday, Wednesday, and Friday.    Marland Kitchen BAYER MICROLET LANCETS lancets Use as instructed to check blood sugar twice a day dx E11.65 100 each 3  . Blood Glucose Monitoring Suppl (ACCU-CHEK GUIDE) w/Device KIT 1 kit by Does not apply route 2 (two) times daily. Use to check blood sugar twice a day 1 kit 2  . busPIRone (BUSPAR) 10 MG tablet TAKE 1 TABLET BY MOUTH 2 TIMES DAILY. 180 tablet 0  . canagliflozin (INVOKANA) 300 MG TABS tablet Take 1 tablet by mouth once daily. 90 tablet 4  . citalopram (CELEXA) 20 MG tablet TAKE 1 TABLET BY MOUTH DAILY. 30 tablet 5  . doxepin (SINEQUAN) 50 MG capsule Take 1 capsule (50 mg total) by mouth at bedtime. 30 capsule 6  . glipiZIDE (GLUCOTROL XL) 10 MG 24 hr tablet TAKE 1 TABLET DAILY WITH BREAKFAST BY MOUTH. 30 tablet 3  . halobetasol (ULTRAVATE) 0.05 % cream Apply topically 2 (two) times daily. Apply to red, itchy bumps twice daily. Can cover with Saran wrap fo r30 min.  DO NOT apply to face/groin.    . hydrOXYzine (ATARAX/VISTARIL) 25 MG tablet Take 1 tablet (25 mg total) by mouth 3 (three) times daily as needed.  0  . pantoprazole (PROTONIX) 20 MG tablet TAKE 1 TABLET BY MOUTH DAILY. 90 tablet 1  . pioglitazone (ACTOS) 45 MG tablet TAKE 1 TABLET BY MOUTH DAILY. *REPLACES 30 MG DOSE* 30 tablet 3  . simvastatin (ZOCOR) 80 MG tablet TAKE 1 TABLET BY MOUTH DAILY. 30 tablet 1  . sitaGLIPtin (JANUVIA) 100 MG tablet Take 1 tablet (100 mg total) by mouth daily. 30 tablet 3  . traZODone  (DESYREL) 150 MG tablet TAKE 1/2 TABLET (75 MG TOTAL) BY MOUTH AT BEDTIME. 45 tablet 3   No current facility-administered medications for this visit.    ROS:   General:  No weight loss, Fever, chills  HEENT: No recent headaches, no nasal bleeding, no visual changes, no  sore throat  Neurologic: No dizziness, blackouts, seizures. No recent symptoms of stroke or mini- stroke. No recent episodes of slurred speech, or temporary blindness.  Cardiac: No recent episodes of chest pain/pressure, no shortness of breath at rest.  No shortness of breath with exertion.  Denies history of atrial fibrillation or irregular heartbeat  Vascular: No history of rest pain in feet.  No history of claudication.  No history of non-healing ulcer, No history of DVT   Pulmonary: No home oxygen, no productive cough, no hemoptysis,  No asthma or wheezing  Musculoskeletal:  '[X]'  Arthritis, '[X]'  Low back pain,  '[X]'  Joint pain  Hematologic:No history of hypercoagulable state.  No history of easy bleeding.  No history of anemia  Gastrointestinal: No hematochezia or melena,  No gastroesophageal reflux, no trouble swallowing  Urinary: '[ ]'  chronic Kidney disease, '[ ]'  on HD - '[ ]'  MWF or '[ ]'  TTHS, '[ ]'  Burning with urination, '[ ]'  Frequent urination, '[ ]'  Difficulty urinating;   Skin: Chronic diffuse macular skin rash under evaluation by dermatology thought to be atopic dermatitis  Psychological: No history of anxiety,  No history of depression   Physical Examination   Vitals:   04/14/19 0823  BP: 133/79  Pulse: 65  Resp: 20  Temp: 98.1 F (36.7 C)  SpO2: 96%  Weight: 161 lb (73 kg)  Height: 5' 6.5" (1.689 m)    General:  Alert and oriented, no acute distress HEENT: Normal Neck: No JVD Pulmonary: Clear to auscultation bilaterally Cardiac: Regular Rate and Rhythm Abdomen: Soft, non-tender, non-distended, no mass Skin: Diffuse 8 mm circular erosive skin lesions arms legs torso Extremity Pulses:  2+ radial,  brachial, faint femoral, absent popliteal dorsalis pedis, posterior tibial pulses bilaterally Musculoskeletal: No deformity or edema  Neurologic: Upper and lower extremity motor 5/5 and symmetric  DATA:  Reviewed the patient's recent ultrasound of the abdominal aorta as per history of present illness  ASSESSMENT: Small abdominal aortic aneurysm 3.2 cm diameter and a fairly young individual with family history of aneurysm.  Left hip pain with abnormal pulse exam possible hip and buttock claudication   PLAN: CT angiogram further define abdomen and pelvis as well as lower extremity runoff to evaluate for arterial occlusive disease.  Bilateral ABIs  I will see the patient in follow-up on December 31 go over these results.  We will try to quit smoking.   Ruta Hinds, MD [facility] Office: [phone] Pager: [phone]

## 2019-04-15 ENCOUNTER — Other Ambulatory Visit: Payer: Self-pay | Admitting: Vascular Surgery

## 2019-04-15 DIAGNOSIS — I714 Abdominal aortic aneurysm, without rupture, unspecified: Secondary | ICD-10-CM

## 2019-04-16 ENCOUNTER — Other Ambulatory Visit: Payer: Self-pay | Admitting: Endocrinology

## 2019-04-16 ENCOUNTER — Other Ambulatory Visit: Payer: Self-pay | Admitting: Family Medicine

## 2019-04-20 ENCOUNTER — Other Ambulatory Visit (INDEPENDENT_AMBULATORY_CARE_PROVIDER_SITE_OTHER): Payer: BC Managed Care – PPO

## 2019-04-20 ENCOUNTER — Other Ambulatory Visit: Payer: Self-pay

## 2019-04-20 DIAGNOSIS — E1165 Type 2 diabetes mellitus with hyperglycemia: Secondary | ICD-10-CM

## 2019-04-20 LAB — BASIC METABOLIC PANEL
BUN: 19 mg/dL (ref 6–23)
CO2: 27 mEq/L (ref 19–32)
Calcium: 9.1 mg/dL (ref 8.4–10.5)
Chloride: 105 mEq/L (ref 96–112)
Creatinine, Ser: 0.99 mg/dL (ref 0.40–1.50)
GFR: 80.28 mL/min (ref >60.00)
Glucose, Bld: 102 mg/dL — ABNORMAL HIGH (ref 70–99)
Potassium: 3.6 mEq/L (ref 3.5–5.1)
Sodium: 139 mEq/L (ref 135–145)

## 2019-04-21 DIAGNOSIS — L259 Unspecified contact dermatitis, unspecified cause: Secondary | ICD-10-CM | POA: Diagnosis not present

## 2019-04-21 DIAGNOSIS — L239 Allergic contact dermatitis, unspecified cause: Secondary | ICD-10-CM | POA: Diagnosis not present

## 2019-04-21 DIAGNOSIS — L309 Dermatitis, unspecified: Secondary | ICD-10-CM | POA: Diagnosis not present

## 2019-04-21 DIAGNOSIS — L281 Prurigo nodularis: Secondary | ICD-10-CM | POA: Diagnosis not present

## 2019-04-21 LAB — FRUCTOSAMINE: Fructosamine: 313 umol/L — ABNORMAL HIGH (ref 0–285)

## 2019-04-26 ENCOUNTER — Encounter: Payer: Self-pay | Admitting: Endocrinology

## 2019-04-26 ENCOUNTER — Other Ambulatory Visit: Payer: Self-pay

## 2019-04-26 ENCOUNTER — Ambulatory Visit (INDEPENDENT_AMBULATORY_CARE_PROVIDER_SITE_OTHER): Payer: BC Managed Care – PPO | Admitting: Endocrinology

## 2019-04-26 DIAGNOSIS — E1165 Type 2 diabetes mellitus with hyperglycemia: Secondary | ICD-10-CM | POA: Diagnosis not present

## 2019-04-26 NOTE — Progress Notes (Signed)
Patient ID: Andrew Hart, male   DOB: 06-09-1970, 48 y.o.   MRN: [id number]           Reason for Appointment: Follow-up for Type 2 Diabetes  I connected with the above-named patient by video enabled telemedicine application and verified that I am speaking with the correct person. The patient was explained the limitations of evaluation and management by telemedicine and the availability of in person appointments.  Patient also understood that there may be a patient responsible charge related to this service . Location of the patient: Patient's home . Location of the provider: Physician office Only the patient and myself were participating in the encounter The patient understood the above statements and agreed to proceed.   History of Present Illness:          Date of diagnosis of type 2 diabetes mellitus: 2007 ?        Background history:  He was diagnosed with routine screening lab from his work He was initially tried on metformin but this caused diarrhea and he could not continue this Subsequently was treated with glipizide and apparently this was able to control his blood sugars fairly well for a few years Probably in early 2016 he was given Actos in addition to help his glucose control as A1c had gone up to 9.4 This helped transiently and A1c was 7.7 However his A1c had been otherwise over 8% since mid 2015 Because of  high blood sugars on his initial consultation in 05/2014 (A1c was 9.1) he was started on Invokana and Onglyza in addition to his Actos and glipizide.   Recent history:   Non-insulin hypoglycemic drugs: Glipizide ER 10 mg daily, Actos 45 mg daily and Invokana 300 mg daily, Januvia 100 mg  His A1c on his last visit was higher at 7.8 Fructosamine 313  Current blood sugar patterns and problems identified:  He is now taking 300 mg Invokana, previously alternating 150 and 300  Although he was tried on Rybelsus previously because of his increasing A1c.  He did not  tolerate this and stopped it after about a month or so  Previously had been concerned about weight loss from [place] but is actually gaining weight, recent weight about 160 at home  Although his fructosamine is still high he does not appear to have any consistently high readings at home  FASTING readings are mildly increased but has had readings as low as 77 later in the day and 102 in the lab when he was late for lunch  Previously was having readings as high as 244 after dinner but he has not checked these recently and highest reading is 170 1 at night  He is a still tries to avoid high-fat foods and a lot of high carbohydrate meals       Side effects from medications have been: Diarrhea from regular metformin, nausea and constipation from Rybelsus  Compliance with the medical regimen: Fair Hypoglycemia: None   Glucose monitoring:  Done once a day or less       Glucometer:  Accu-Chek   Blood Glucose readings from patient review of monitor   PRE-MEAL Fasting Lunch Dinner Bedtime Overall  Glucose range:  130-163   77  103-171   Mean/median:     ?   Previous readings:  PRE-MEAL Fasting Lunch Dinner Bedtime Overall  Glucose range:  125-144    152-244   Mean/median:  133    200     Dietician visit, most recent:  06/2015               Exercise: Mostly when he is active at work  Weight history:   Wt Readings from Last 3 Encounters:  04/14/19 161 lb (73 kg)  03/14/19 153 lb 3 oz (69.5 kg)  01/20/19 157 lb 9.6 oz (71.5 kg)    Glycemic control:    Lab Results  Component Value Date   HGBA1C 7.8 (H) 01/17/2019   HGBA1C 7.5 (H) 10/19/2018   HGBA1C 7.0 (H) 05/06/2018   Lab Results  Component Value Date   MICROALBUR <0.7 03/07/2019   LDLCALC 86 03/07/2019   CREATININE 0.99 04/20/2019    Lab on 04/20/2019  Component Date Value Ref Range Status  . Sodium 04/20/2019 139  135 - 145 mEq/L Final  . Potassium 04/20/2019 3.6  3.5 - 5.1 mEq/L Final  . Chloride 04/20/2019 105   96 - 112 mEq/L Final  . CO2 04/20/2019 27  19 - 32 mEq/L Final  . Glucose, Bld 04/20/2019 102* 70 - 99 mg/dL Final  . BUN 04/20/2019 19  6 - 23 mg/dL Final  . Creatinine, Ser 04/20/2019 0.99  0.40 - 1.50 mg/dL Final  . GFR 04/20/2019 80.28  >60.00 mL/min Final  . Calcium 04/20/2019 9.1  8.4 - 10.5 mg/dL Final  . Fructosamine 04/20/2019 313* 0 - 285 umol/L Final   Comment: Published reference interval for apparently healthy subjects between age 61 and 18 is 15 - 285 umol/L and in a poorly controlled diabetic population is 228 - 563 umol/L with a mean of 396 umol/L.       Allergies as of 04/26/2019      Reactions   Janumet Xr [sitagliptin-metformin Hcl Er] Rash   Jardiance [empagliflozin] Rash   Metformin And Related Diarrhea   Methotrexate Derivatives Other (See Comments)   transaminitis   Other Rash   Methyldibromo Gluteronitrile - skin product preservative      Medication List       Accurate as of April 26, 2019  8:29 AM. If you have any questions, ask your nurse or doctor.        Accu-Chek Guide test strip Generic drug: glucose blood USE TO CHECK BLOOD SUGARS TWICE DAILY   Accu-Chek Guide w/Device Kit 1 kit by Does not apply route 2 (two) times daily. Use to check blood sugar twice a day   acyclovir 200 MG capsule Commonly known as: ZOVIRAX TAKE 1 CAPSULE BY MOUTH 3 TIMES DAILY FOR 3 TO 5 DAYS AS NEEDED.   aspirin EC 81 MG tablet Take 1 tablet (81 mg total) by mouth every Monday, Wednesday, and Friday.   Bayer Microlet Lancets lancets Use as instructed to check blood sugar twice a day dx E11.65   busPIRone 10 MG tablet Commonly known as: BUSPAR TAKE 1 TABLET BY MOUTH 2 TIMES DAILY.   canagliflozin 300 MG Tabs tablet Commonly known as: Invokana Take 1 tablet by mouth once daily.   citalopram 20 MG tablet Commonly known as: CELEXA TAKE 1 TABLET BY MOUTH DAILY.   glipiZIDE 10 MG 24 hr tablet Commonly known as: GLUCOTROL XL TAKE 1 TABLET DAILY WITH  BREAKFAST BY MOUTH.   halobetasol 0.05 % cream Commonly known as: ULTRAVATE Apply topically 2 (two) times daily. Apply to red, itchy bumps twice daily. Can cover with Saran wrap fo r30 min.  DO NOT apply to face/groin.   hydrOXYzine 25 MG tablet Commonly known as: ATARAX/VISTARIL Take 1 tablet (25 mg total) by mouth 3 (  three) times daily as needed.   pantoprazole 20 MG tablet Commonly known as: PROTONIX TAKE 1 TABLET BY MOUTH DAILY.   pioglitazone 45 MG tablet Commonly known as: ACTOS TAKE 1 TABLET BY MOUTH DAILY.   simvastatin 80 MG tablet Commonly known as: ZOCOR TAKE 1 TABLET BY MOUTH DAILY.   sitaGLIPtin 100 MG tablet Commonly known as: Januvia Take 1 tablet (100 mg total) by mouth daily.   traZODone 150 MG tablet Commonly known as: DESYREL TAKE 1/2 TABLET (75 MG TOTAL) BY MOUTH AT BEDTIME.       Allergies:  Allergies  Allergen Reactions  . Janumet Xr [Sitagliptin-Metformin Hcl Er] Rash  . Jardiance [Empagliflozin] Rash  . Metformin And Related Diarrhea  . Methotrexate Derivatives Other (See Comments)    transaminitis  . Other Rash    Methyldibromo Gluteronitrile - skin product preservative    Past Medical History:  Diagnosis Date  . Alcohol use 11/08/2015  . Diabetes mellitus without complication ([place]) 3704   Erisa Mehlman  . GERD (gastroesophageal reflux disease)   . Herpes labialis   . History of chicken pox   . Hyperlipidemia     Past Surgical History:  Procedure Laterality Date  . APPENDECTOMY    . FINGER SURGERY Right    2 screws in ring finger  . TONSILLECTOMY      Family History  Problem Relation Age of Onset  . Diabetes Mother   . Parkinsonism Mother 45       progressive supranuclear palsy, deceased  . CAD Father 58       MI at 72, CABG at 9  . Diabetes Maternal Grandmother   . Stroke Neg Hx   . Cancer Neg Hx     Social History:  reports that he has been smoking cigarettes. He has a 7.50 pack-year smoking history. He has never used  smokeless tobacco. He reports current alcohol use. He reports current drug use.    Review of Systems    Lipid history:  Has been on 80 mg simvastatin for treatment, prescribed by PCP  His lipids are well controlled    Lab Results  Component Value Date   CHOL 155 03/07/2019   HDL 38.40 (L) 03/07/2019   LDLCALC 86 03/07/2019   TRIG 152.0 (H) 03/07/2019   CHOLHDL 4 03/07/2019           Most recent foot exam: 3/20  He is on a new injection every 2 weeks from his dermatologist for his rash   Review of Systems     Physical Examination:  There were no vitals taken for this visit.    ASSESSMENT:  Diabetes type 2, non-insulin-dependent  See history of present illness for detailed discussion of current diabetes management, blood sugar patterns and problems identified  His A1c was 7.8 Fructosamine is now 313  He was not able to tolerate Rybelsus and usually does not want to do any injectable drugs  Although his A1c has not been checked he likely still has inadequate control Fasting readings are mildly increased but he does not appear to have consistently high readings at bedtime although monitoring is insufficient. He also can be more active for exercise  Renal function normal with increasing Invokana to 300 mg daily  PLAN:    He will check more readings after meals and alternate fasting and after meal reading Avoid skipping meals to avoid hypoglycemia with taking glipizide No change in basic medication regimen as yet Regular walking for exercise May consider Trulicity if  blood sugars are not well controlled  Follow-up in 2 months with repeat A1c     There are no Patient Instructions on file for this visit.   Andrew Hart 04/26/2019, 8:29 AM   Note: This office note was prepared with Dragon voice recognition system technology. Any transcriptional errors that result from this process are unintentional.

## 2019-04-28 ENCOUNTER — Other Ambulatory Visit: Payer: Self-pay

## 2019-04-28 DIAGNOSIS — I739 Peripheral vascular disease, unspecified: Secondary | ICD-10-CM

## 2019-05-02 ENCOUNTER — Ambulatory Visit
Admission: RE | Admit: 2019-05-02 | Discharge: 2019-05-02 | Disposition: A | Discharge status: Home / Self Care | Payer: BC Managed Care – PPO | Source: Ambulatory Visit | Attending: Vascular Surgery | Admitting: Vascular Surgery

## 2019-05-02 DIAGNOSIS — I714 Abdominal aortic aneurysm, without rupture, unspecified: Secondary | ICD-10-CM

## 2019-05-02 MED ORDER — IOPAMIDOL (ISOVUE-370) INJECTION 76%
100.0000 mL | Freq: Once | INTRAVENOUS | Status: AC | PRN
  Administered 2019-05-02: 100 mL via INTRAVENOUS

## 2019-05-05 ENCOUNTER — Other Ambulatory Visit: Payer: Self-pay

## 2019-05-05 ENCOUNTER — Ambulatory Visit (HOSPITAL_COMMUNITY)
Admission: RE | Admit: 2019-05-05 | Discharge: 2019-05-05 | Disposition: A | Discharge status: Home / Self Care | Payer: BC Managed Care – PPO | Source: Ambulatory Visit | Attending: Vascular Surgery | Admitting: Vascular Surgery

## 2019-05-05 ENCOUNTER — Ambulatory Visit: Payer: BC Managed Care – PPO | Admitting: Vascular Surgery

## 2019-05-05 ENCOUNTER — Encounter: Payer: Self-pay | Admitting: Vascular Surgery

## 2019-05-05 VITALS — BP 125/81 | HR 67 | Temp 97.8°F | Resp 20 | Ht 66.5 in | Wt 157.2 lb

## 2019-05-05 DIAGNOSIS — I739 Peripheral vascular disease, unspecified: Secondary | ICD-10-CM | POA: Insufficient documentation

## 2019-05-05 DIAGNOSIS — I714 Abdominal aortic aneurysm, without rupture, unspecified: Secondary | ICD-10-CM

## 2019-05-05 NOTE — Progress Notes (Signed)
Patient is a 48 year old male who returns for follow-up today regarding abdominal aortic aneurysm.  He returns after recent CT angiogram with runoff.  He has no abdominal or back pain.  He does still complain of pain in his left hip and buttocks after walking about 30 minutes.  This has not really changed.  He is on a statin and aspirin.  Past Medical History:  Diagnosis Date  . Alcohol use 11/08/2015  . Diabetes mellitus without complication ([place]) 8756   Kumar  . GERD (gastroesophageal reflux disease)   . Herpes labialis   . History of chicken pox   . Hyperlipidemia     Past Surgical History:  Procedure Laterality Date  . APPENDECTOMY    . FINGER SURGERY Right    2 screws in ring finger  . TONSILLECTOMY      Current Outpatient Medications on File Prior to Visit  Medication Sig Dispense Refill  . ACCU-CHEK GUIDE test strip USE TO CHECK BLOOD SUGARS TWICE DAILY 100 each 3  . acyclovir (ZOVIRAX) 200 MG capsule TAKE 1 CAPSULE BY MOUTH 3 TIMES DAILY FOR 3 TO 5 DAYS AS NEEDED. 30 capsule 3  . aspirin EC 81 MG tablet Take 1 tablet (81 mg total) by mouth every Monday, Wednesday, and Friday.    Marland Kitchen BAYER MICROLET LANCETS lancets Use as instructed to check blood sugar twice a day dx E11.65 100 each 3  . Blood Glucose Monitoring Suppl (ACCU-CHEK GUIDE) w/Device KIT 1 kit by Does not apply route 2 (two) times daily. Use to check blood sugar twice a day 1 kit 2  . busPIRone (BUSPAR) 10 MG tablet TAKE 1 TABLET BY MOUTH 2 TIMES DAILY. 180 tablet 0  . canagliflozin (INVOKANA) 300 MG TABS tablet Take 1 tablet by mouth once daily. 90 tablet 4  . citalopram (CELEXA) 20 MG tablet TAKE 1 TABLET BY MOUTH DAILY. 30 tablet 5  . doxycycline (VIBRAMYCIN) 100 MG capsule Take 100 mg by mouth 2 (two) times daily.    . dupilumab (DUPIXENT) 300 MG/2ML prefilled syringe Inject into the skin.    Marland Kitchen glipiZIDE (GLUCOTROL XL) 10 MG 24 hr tablet TAKE 1 TABLET DAILY WITH BREAKFAST BY MOUTH. 30 tablet 3  . halobetasol  (ULTRAVATE) 0.05 % cream Apply topically 2 (two) times daily. Apply to red, itchy bumps twice daily. Can cover with Saran wrap fo r30 min.  DO NOT apply to face/groin.    . hydrOXYzine (ATARAX/VISTARIL) 25 MG tablet Take 1 tablet (25 mg total) by mouth 3 (three) times daily as needed.  0  . pantoprazole (PROTONIX) 20 MG tablet TAKE 1 TABLET BY MOUTH DAILY. 90 tablet 1  . pioglitazone (ACTOS) 45 MG tablet TAKE 1 TABLET BY MOUTH DAILY. 30 tablet 3  . simvastatin (ZOCOR) 80 MG tablet TAKE 1 TABLET BY MOUTH DAILY. 30 tablet 1  . sitaGLIPtin (JANUVIA) 100 MG tablet Take 1 tablet (100 mg total) by mouth daily. 30 tablet 3  . traZODone (DESYREL) 150 MG tablet TAKE 1/2 TABLET (75 MG TOTAL) BY MOUTH AT BEDTIME. 45 tablet 3   No current facility-administered medications on file prior to visit.    Physical exam:  Vitals:   05/05/19 1052  BP: 125/81  Pulse: 67  Resp: 20  Temp: 97.8 F (36.6 C)  SpO2: 97%  Weight: 157 lb 3.2 oz (71.3 kg)  Height: 5' 6.5" (1.689 m)    General: Well-developed male no acute distress  Data: I reviewed the patient's CT images today which shows  a 3 cm infrarenal abdominal aortic aneurysm.  There is a greater than 80% stenosis of his left common iliac artery.  Otherwise no other obvious aneurysms and intact runoff.  There was diffuse atherosclerosis.  Patient had bilateral ABIs performed today.  I reviewed and interpreted the study.  Right side was one and normal.  Left side was 0.75.  Assessment:   1.   infrarenal abdominal aortic aneurysm 3 cm diameter needs follow-up ultrasound in 2 years  2.  Peripheral arterial disease with high-grade left common iliac stenosis most likely the reason for his buttock and thigh claudication on the left side.  I discussed with the patient today the possibility of aortogram lower extremity runoff possible intervention for this stenosis.  He wished to discuss this with his wife further before proceeding with an intervention.  He will  call us at some point in the future if he wishes to proceed.  Risk benefits possible complications and procedure details of arteriogram were explained to the patient today include but not limited to bleeding infection vessel injury contrast reaction.  Ruta Hinds, MD [facility] Office: [phone]

## 2019-05-09 ENCOUNTER — Telehealth: Payer: Self-pay | Admitting: *Deleted

## 2019-05-09 NOTE — Telephone Encounter (Signed)
Noted  

## 2019-05-09 NOTE — Telephone Encounter (Signed)
Patient called stating that he was exposed to someone last Thursday that has tested positive for covid. Patient stated that he is not having any symptoms, but wants information on testing. Patient was given testing site information.

## 2019-05-10 ENCOUNTER — Other Ambulatory Visit: Payer: Self-pay | Admitting: Family Medicine

## 2019-05-13 ENCOUNTER — Other Ambulatory Visit (HOSPITAL_COMMUNITY): Payer: Self-pay | Admitting: Vascular Surgery

## 2019-05-13 DIAGNOSIS — I714 Abdominal aortic aneurysm, without rupture, unspecified: Secondary | ICD-10-CM

## 2019-06-17 ENCOUNTER — Other Ambulatory Visit: Payer: Self-pay | Admitting: Family Medicine

## 2019-07-15 ENCOUNTER — Other Ambulatory Visit: Payer: Self-pay

## 2019-07-21 ENCOUNTER — Other Ambulatory Visit (HOSPITAL_COMMUNITY)
Admission: RE | Admit: 2019-07-21 | Discharge: 2019-07-21 | Disposition: A | Discharge status: Home / Self Care | Payer: 59 | Source: Ambulatory Visit | Attending: Vascular Surgery | Admitting: Vascular Surgery

## 2019-07-21 DIAGNOSIS — Z01812 Encounter for preprocedural laboratory examination: Secondary | ICD-10-CM | POA: Diagnosis present

## 2019-07-21 DIAGNOSIS — Z20822 Contact with and (suspected) exposure to covid-19: Secondary | ICD-10-CM | POA: Diagnosis not present

## 2019-07-21 LAB — SARS CORONAVIRUS 2 (TAT 6-24 HRS): SARS Coronavirus 2: NEGATIVE

## 2019-07-22 ENCOUNTER — Other Ambulatory Visit: Payer: Self-pay

## 2019-07-22 ENCOUNTER — Ambulatory Visit (HOSPITAL_COMMUNITY)
Admission: RE | Admit: 2019-07-22 | Discharge: 2019-07-22 | Disposition: A | Discharge status: Home / Self Care | Payer: 59 | Attending: Vascular Surgery | Admitting: Vascular Surgery

## 2019-07-22 ENCOUNTER — Encounter (HOSPITAL_COMMUNITY)
Admission: RE | Disposition: A | Discharge status: Home / Self Care | Payer: Self-pay | Source: Home / Self Care | Attending: Vascular Surgery

## 2019-07-22 DIAGNOSIS — Z7982 Long term (current) use of aspirin: Secondary | ICD-10-CM | POA: Insufficient documentation

## 2019-07-22 DIAGNOSIS — Z7984 Long term (current) use of oral hypoglycemic drugs: Secondary | ICD-10-CM | POA: Insufficient documentation

## 2019-07-22 DIAGNOSIS — F17210 Nicotine dependence, cigarettes, uncomplicated: Secondary | ICD-10-CM | POA: Insufficient documentation

## 2019-07-22 DIAGNOSIS — K219 Gastro-esophageal reflux disease without esophagitis: Secondary | ICD-10-CM | POA: Insufficient documentation

## 2019-07-22 DIAGNOSIS — I70212 Atherosclerosis of native arteries of extremities with intermittent claudication, left leg: Secondary | ICD-10-CM | POA: Insufficient documentation

## 2019-07-22 DIAGNOSIS — Z79899 Other long term (current) drug therapy: Secondary | ICD-10-CM | POA: Diagnosis not present

## 2019-07-22 DIAGNOSIS — E1151 Type 2 diabetes mellitus with diabetic peripheral angiopathy without gangrene: Secondary | ICD-10-CM | POA: Insufficient documentation

## 2019-07-22 DIAGNOSIS — E785 Hyperlipidemia, unspecified: Secondary | ICD-10-CM | POA: Insufficient documentation

## 2019-07-22 DIAGNOSIS — I714 Abdominal aortic aneurysm, without rupture: Secondary | ICD-10-CM | POA: Insufficient documentation

## 2019-07-22 HISTORY — PX: ABDOMINAL AORTOGRAM W/LOWER EXTREMITY: CATH118223

## 2019-07-22 HISTORY — PX: PERIPHERAL VASCULAR INTERVENTION: CATH118257

## 2019-07-22 LAB — POCT I-STAT, CHEM 8
BUN: 20 mg/dL (ref 6–20)
Calcium, Ion: 1.23 mmol/L (ref 1.15–1.40)
Chloride: 104 mmol/L (ref 98–111)
Creatinine, Ser: 0.9 mg/dL (ref 0.61–1.24)
Glucose, Bld: 185 mg/dL — ABNORMAL HIGH (ref 70–99)
HCT: 53 % — ABNORMAL HIGH (ref 39.0–52.0)
Hemoglobin: 18 g/dL — ABNORMAL HIGH (ref 13.0–17.0)
Potassium: 4.4 mmol/L (ref 3.5–5.1)
Sodium: 140 mmol/L (ref 135–145)
TCO2: 27 mmol/L (ref 22–32)

## 2019-07-22 LAB — POCT ACTIVATED CLOTTING TIME
Activated Clotting Time: 246 seconds
Activated Clotting Time: 186 seconds

## 2019-07-22 LAB — GLUCOSE, CAPILLARY: Glucose-Capillary: 96 mg/dL (ref 70–99)

## 2019-07-22 SURGERY — ABDOMINAL AORTOGRAM W/LOWER EXTREMITY
Anesthesia: LOCAL

## 2019-07-22 MED ORDER — FENTANYL CITRATE (PF) 100 MCG/2ML IJ SOLN
INTRAMUSCULAR | Status: DC | PRN
  Administered 2019-07-22: 25 ug via INTRAVENOUS

## 2019-07-22 MED ORDER — HEPARIN (PORCINE) IN NACL 1000-0.9 UT/500ML-% IV SOLN
INTRAVENOUS | Status: AC
  Filled 2019-07-22: qty 1000

## 2019-07-22 MED ORDER — LIDOCAINE HCL (PF) 1 % IJ SOLN
INTRAMUSCULAR | Status: DC | PRN
  Administered 2019-07-22: 20 mL via INTRADERMAL

## 2019-07-22 MED ORDER — HEPARIN SODIUM (PORCINE) 1000 UNIT/ML IJ SOLN
INTRAMUSCULAR | Status: DC | PRN
  Administered 2019-07-22: 7000 [IU] via INTRAVENOUS

## 2019-07-22 MED ORDER — ACETAMINOPHEN 325 MG PO TABS: 650.0000 mg | ORAL_TABLET | ORAL | Status: DC | PRN

## 2019-07-22 MED ORDER — SODIUM CHLORIDE 0.9 % IV SOLN: 250.0000 mL | INTRAVENOUS | Status: DC | PRN

## 2019-07-22 MED ORDER — LIDOCAINE HCL (PF) 1 % IJ SOLN
INTRAMUSCULAR | Status: AC
  Filled 2019-07-22: qty 30

## 2019-07-22 MED ORDER — ONDANSETRON HCL 4 MG/2ML IJ SOLN: 4.0000 mg | Freq: Four times a day (QID) | INTRAMUSCULAR | Status: DC | PRN

## 2019-07-22 MED ORDER — SODIUM CHLORIDE 0.9% FLUSH: 3.0000 mL | INTRAVENOUS | Status: DC | PRN

## 2019-07-22 MED ORDER — HYDRALAZINE HCL 20 MG/ML IJ SOLN: 5.0000 mg | INTRAMUSCULAR | Status: DC | PRN

## 2019-07-22 MED ORDER — FENTANYL CITRATE (PF) 100 MCG/2ML IJ SOLN
INTRAMUSCULAR | Status: AC
  Filled 2019-07-22: qty 2

## 2019-07-22 MED ORDER — SODIUM CHLORIDE 0.9 % IV SOLN: INTRAVENOUS | Status: DC

## 2019-07-22 MED ORDER — LABETALOL HCL 5 MG/ML IV SOLN: 10.0000 mg | INTRAVENOUS | Status: DC | PRN

## 2019-07-22 MED ORDER — SODIUM CHLORIDE 0.9% FLUSH: 3.0000 mL | Freq: Two times a day (BID) | INTRAVENOUS | Status: DC

## 2019-07-22 SURGICAL SUPPLY — 13 items
CATH ANGIO 5F PIGTAIL 65CM (CATHETERS) ×1 IMPLANT
KIT ENCORE 26 ADVANTAGE (KITS) ×1 IMPLANT
KIT PV (KITS) ×3 IMPLANT
SHEATH BRITE TIP 7FR 35CM (SHEATH) ×1 IMPLANT
SHEATH BRITE TIP 8FR 35CM (SHEATH) ×1 IMPLANT
SHEATH PINNACLE 5F 10CM (SHEATH) ×1 IMPLANT
SHEATH PROBE COVER 6X72 (BAG) ×1 IMPLANT
STENT VIABAHNBX 9X39X135 (Permanent Stent) ×1 IMPLANT
SYR MEDRAD MARK V 150ML (SYRINGE) ×1 IMPLANT
TRANSDUCER W/STOPCOCK (MISCELLANEOUS) ×3 IMPLANT
TRAY PV CATH (CUSTOM PROCEDURE TRAY) ×3 IMPLANT
WIRE BENTSON .035X145CM (WIRE) ×1 IMPLANT
WIRE ROSEN-J .035X260CM (WIRE) ×1 IMPLANT

## 2019-07-22 NOTE — Op Note (Signed)
Procedure: Abdominal aortogram with bilateral lower extremity runoff left common iliac stent (9 x 39 VBX), ultrasound left groin  Reoperative diagnosis: Claudication left leg  Postoperative diagnosis: Same  Anesthesia: Local  Operative findings: 1.  80% narrowing left common iliac origin stented to 0% residual stenosis 9 x 39 VBX  2.  40 to 50% narrowing right common iliac origin  3.  Infrarenal abdominal aortic aneurysm consistent with CT scan measurement of 3-1/2 cm  4.  No significant outflow occlusive disease  Operative details: After obtaining form consent, the patient was taken the PV lab.  The patient placed supine position on the angio table.  Both groins were prepped and draped in usual sterile fashion.  Local anesthesia was infiltrated over the left common femoral artery.  Ultrasound was used to identify the left common femoral artery and femoral bifurcation.  Next an introducer needle was used to cannulate left common femoral artery under ultrasound guidance.  035 Bentson wire was threaded up the abdominal aorta under fluoroscopic guidance.  A 5 French sheath placed over the guidewire in the left common femoral artery.  This was thoroughly flushed with heparinized saline.  A 5 French pigtail catheter was advanced over the guidewire into the abdominal aorta.  Abdominal aortogram was obtained in AP projection.  Left and right renal arteries are widely patent.  Infrarenal abdominal aorta is patent.  There is aneurysmal configuration consistent with the patient's prior CT scan of a 3-1/2 cm abdominal aortic aneurysm.  Left common iliac origin has about an 80 to 90% stenosis.  There is about a 40 to 50% stenosis of the right common iliac origin.  The left internal iliac artery is occluded.  The right internal iliac artery is patent.  Next pigtail catheter is pulled down just above the aortic bifurcation a magnified views of the pelvis were performed to confirm the above findings.  At this point  bilateral lower extremity runoff views were obtained through the pigtail catheter.  In the left lower extremity, the left common femoral profundofemoral superficial femoral-popliteal anterior tibial posterior tibial and peroneal arteries are all widely patent.  There is some mild atherosclerotic change of the tibial vessels distally.  In the right lower extremity.  There are similar findings.  At this point it was decided to intervene on the left common iliac origin stenosis.  The patient was given 7000 units of intravenous heparin.  The sheath in the left groin was swapped out for a 5 to a 7 [place] Brite tip sheath.  This was advanced up and through the lesion of the left common iliac.  Angiogram was then performed for roadmapping.  A 9 x 39 VBX stent was selected.  I tried to advance this through the 7 [place] sheath but it was too large for this.  Therefore the 7 French sheath was swapped out over an [employer] wire for an 8 [place] sheath.  The stent then traveled easily through this.  This was then centered on the lesion right at the origin of the left common iliac and deployed to nominal pressure for 15 seconds.  Patient experienced some discomfort and this was treated with 25 mcg of fentanyl.  The balloon was then deflated.  Completion angiogram showed no evidence of dissection widely patent left common iliac artery still 40 to 50% stenosis of the right common iliac artery.  At this point the sheath was pulled back down in the left hemipelvis.  The guidewire was removed.  The patient was  taken to the holding area in stable condition.  Operative management: Successful stenting of left common iliac artery which should improve the patient's claudication symptoms.  We will arrange for him to have bilateral ABIs with exercise and an aortoiliac duplex in 1 month's time.  If his symptoms are resolved at that point.  He will need a follow-up ultrasound of the abdominal aorta aortoiliac duplex scan in 6 months  time to continue to follow his left iliac stent and his infrarenal abdominal aortic aneurysm.  He will be maintained on aspirin.  Ruta Hinds, MD [facility] Office: [phone]

## 2019-07-22 NOTE — Discharge Instructions (Signed)
Femoral Site Care This sheet gives you information about how to care for yourself after your procedure. Your health care provider may also give you more specific instructions. If you have problems or questions, contact your health care provider. What can I expect after the procedure?  After the procedure, it is common to have:  Bruising that usually fades within 1-2 weeks.  Tenderness at the site. Follow these instructions at home: Wound care 1. Follow instructions from your health care provider about how to take care of your insertion site. Make sure you: ? Wash your hands with soap and water before you change your bandage (dressing). If soap and water are not available, use hand sanitizer. ? Remove your dressing as told by your health care provider. In 24 hours 2. Do not take baths, swim, or use a hot tub until your health care provider approves. 3. You may shower 24-48 hours after the procedure or as told by your health care provider. ? Gently wash the site with plain soap and water. ? Pat the area dry with a clean towel. ? Do not rub the site. This may cause bleeding. 4. Do not apply powder or lotion to the site. Keep the site clean and dry. 5. Check your femoral site every day for signs of infection. Check for: ? Redness, swelling, or pain. ? Fluid or blood. ? Warmth. ? Pus or a bad smell. Activity 1. For the first 2-3 days after your procedure, or as long as directed: ? Avoid climbing stairs as much as possible. ? Do not squat. 2. Do not lift anything that is heavier than 10 lb (4.5 kg), or the limit that you are told, until your health care provider says that it is safe.  3. Rest as directed. ? Avoid sitting for a long time without moving. Get up to take short walks every 1-2 hours. 4. Do not drive for 24 hours if you were given a medicine to help you relax (sedative). General instructions  Take over-the-counter and prescription medicines only as told by your health care  provider.  Keep all follow-up visits as told by your health care provider. This is important. Contact a health care provider if you have:  A fever or chills.  You have redness, swelling, or pain around your insertion site. Get help right away if:  The catheter insertion area swells very fast.  You pass out.  You suddenly start to sweat or your skin gets clammy.  The catheter insertion area is bleeding, and the bleeding does not stop when you hold steady pressure on the area.  The area near or just beyond the catheter insertion site becomes pale, cool, tingly, or numb. These symptoms may represent a serious problem that is an emergency. Do not wait to see if the symptoms will go away. Get medical help right away. Call your local emergency services (911 in the [place] Do not drive yourself to the hospital. Summary  After the procedure, it is common to have bruising that usually fades within 1-2 weeks.  Check your femoral site every day for signs of infection.  Do not lift anything that is heavier than 10 lb (4.5 kg), or the limit that you are told, until your health care provider says that it is safe. This information is not intended to replace advice given to you by your health care provider. Make sure you discuss any questions you have with your health care provider. Document Revised: 05/04/2017 Document Reviewed: 05/04/2017 Elsevier Patient  Education  [employer].

## 2019-07-22 NOTE — Progress Notes (Signed)
Up and walked and tolerated well; groin stable, no bleeding or hematoma 

## 2019-07-22 NOTE — Progress Notes (Signed)
55fr sheath aspirated and removed from lfa. Manual pressure applied for 20 minutes. Site level o distal AT pulses present with doppler.  Tegaderm dressing applied, bedrest instuctions given.  Bed rest begins at 14:36:00

## 2019-07-23 NOTE — H&P (Signed)
Referring Physician: Dr Danise Mina  Patient name: Andrew Hart MRN: [id number] DOB: 14-Oct-1970 Sex: male  REASON FOR CONSULT: AAA  HPI:  Andrew Hart is a 49 y.o. male, sent for evaluation of abdominal aortic aneurysm. He had an aortic ultrasound performed in April 2019 which showed a 3.6 cm abdominal aortic aneurysm. This was an ultrasound done for weight loss. He subsequently had a repeat ultrasound March 26, 2019 which showed the aortic diameter was 3.2 cm. He does have a family history of abdominal aortic aneurysm in his father. His father had his aneurysm repaired in his 26s. He does not have any abdominal or back pain. However, he does have pain in his left hip and buttocks region after walking about 30 minutes. He does not really complain of calf claudication. 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 Patient is on statin and an aspirin. Other medical problems include diabetes hyperlipidemia both of which are currently stable. I did discuss smoking cessation with him today.      Past Medical History:  Diagnosis Date  . Alcohol use 11/08/2015  . Diabetes mellitus without complication ([place]) 5784   Kumar  . GERD (gastroesophageal reflux disease)   . Herpes labialis   . History of chicken pox   . Hyperlipidemia         Past Surgical History:  Procedure Laterality Date  . APPENDECTOMY    . FINGER SURGERY Right    2 screws in ring finger  . TONSILLECTOMY          Family History  Problem Relation Age of Onset  . Diabetes Mother   . Parkinsonism Mother 55   progressive supranuclear palsy, deceased  . CAD Father 25   MI at 74, CABG at 41  . Diabetes Maternal Grandmother   . Stroke Neg Hx   . Cancer Neg Hx    SOCIAL HISTORY:  Social History        Socioeconomic History  . Marital status: Married    Spouse name: Not on file  . Number of children: Not on file  . Years of education: Not on file  . Highest education level: Not on file  Occupational History  .  Not on file  Tobacco Use  . Smoking status: Current Every Day Smoker    Packs/day: 0.50    Years: 15.00    Pack years: 7.50    Types: Cigarettes    Last attempt to quit: 12/04/2014    Years since quitting: 4.3  . Smokeless tobacco: Never Used  Substance and Sexual Activity  . Alcohol use: Yes    Alcohol/week: 0.0 standard drinks    Comment: Regular beer (12 pk/wkend)  . Drug use: Yes    Comment: MJ (occasional)  . Sexual activity: Not on file  Other Topics Concern  . Not on file  Social History Narrative   Lives with wife and 2 children (daughter 2003, son 40)    Occ: owns Education administrator business    Activity: active at work, at home on yard   Diet: some water, fruits/vegetables daily    Social Determinants of Scientist, physiological Strain:   . Difficulty of Paying Living Expenses: Not on file  Food Insecurity:   . Worried About Charity fundraiser in the Last Year: Not on file  . Ran Out of Food in the Last Year: Not on file  Transportation Needs:   . Lack of Transportation (Medical): Not on file  .  Lack of Transportation (Non-Medical): Not on file  Physical Activity:   . Days of Exercise per Week: Not on file  . Minutes of Exercise per Session: Not on file  Stress:   . Feeling of Stress : Not on file  Social Connections:   . Frequency of Communication with Friends and Family: Not on file  . Frequency of Social Gatherings with Friends and Family: Not on file  . Attends Religious Services: Not on file  . Active Member of Clubs or Organizations: Not on file  . Attends Archivist Meetings: Not on file  . Marital Status: Not on file  Intimate Partner Violence:   . Fear of Current or Ex-Partner: Not on file  . Emotionally Abused: Not on file  . Physically Abused: Not on file  . Sexually Abused: Not on file        Allergies  Allergen Reactions  . Janumet Xr [Sitagliptin-Metformin Hcl Er] Rash  . Jardiance [Empagliflozin] Rash  . Metformin And Related  Diarrhea  . Methotrexate Derivatives Other (See Comments)    transaminitis  . Other Rash    Methyldibromo Gluteronitrile - skin product preservative         Current Outpatient Medications  Medication Sig Dispense Refill  . ACCU-CHEK GUIDE test strip USE TO CHECK BLOOD SUGARS TWICE DAILY 100 each 3  . acyclovir (ZOVIRAX) 200 MG capsule TAKE 1 CAPSULE BY MOUTH 3 TIMES DAILY FOR 3 TO 5 DAYS AS NEEDED. 30 capsule 3  . aspirin EC 81 MG tablet Take 1 tablet (81 mg total) by mouth every Monday, Wednesday, and Friday.    Marland Kitchen BAYER MICROLET LANCETS lancets Use as instructed to check blood sugar twice a day dx E11.65 100 each 3  . Blood Glucose Monitoring Suppl (ACCU-CHEK GUIDE) w/Device KIT 1 kit by Does not apply route 2 (two) times daily. Use to check blood sugar twice a day 1 kit 2  . busPIRone (BUSPAR) 10 MG tablet TAKE 1 TABLET BY MOUTH 2 TIMES DAILY. 180 tablet 0  . canagliflozin (INVOKANA) 300 MG TABS tablet Take 1 tablet by mouth once daily. 90 tablet 4  . citalopram (CELEXA) 20 MG tablet TAKE 1 TABLET BY MOUTH DAILY. 30 tablet 5  . doxepin (SINEQUAN) 50 MG capsule Take 1 capsule (50 mg total) by mouth at bedtime. 30 capsule 6  . glipiZIDE (GLUCOTROL XL) 10 MG 24 hr tablet TAKE 1 TABLET DAILY WITH BREAKFAST BY MOUTH. 30 tablet 3  . halobetasol (ULTRAVATE) 0.05 % cream Apply topically 2 (two) times daily. Apply to red, itchy bumps twice daily. Can cover with Saran wrap fo r30 min. DO NOT apply to face/groin.    . hydrOXYzine (ATARAX/VISTARIL) 25 MG tablet Take 1 tablet (25 mg total) by mouth 3 (three) times daily as needed.  0  . pantoprazole (PROTONIX) 20 MG tablet TAKE 1 TABLET BY MOUTH DAILY. 90 tablet 1  . pioglitazone (ACTOS) 45 MG tablet TAKE 1 TABLET BY MOUTH DAILY. *REPLACES 30 MG DOSE* 30 tablet 3  . simvastatin (ZOCOR) 80 MG tablet TAKE 1 TABLET BY MOUTH DAILY. 30 tablet 1  . sitaGLIPtin (JANUVIA) 100 MG tablet Take 1 tablet (100 mg total) by mouth daily. 30 tablet 3  . traZODone  (DESYREL) 150 MG tablet TAKE 1/2 TABLET (75 MG TOTAL) BY MOUTH AT BEDTIME. 45 tablet 3   No current facility-administered medications for this visit.   ROS:  General: No weight loss, Fever, chills  HEENT: No recent headaches, no nasal  bleeding, no visual changes, no sore throat  Neurologic: No dizziness, blackouts, seizures. No recent symptoms of stroke or mini- stroke. No recent episodes of slurred speech, or temporary blindness.  Cardiac: No recent episodes of chest pain/pressure, no shortness of breath at rest. No shortness of breath with exertion. Denies history of atrial fibrillation or irregular heartbeat  Vascular: No history of rest pain in feet. No history of claudication. No history of non-healing ulcer, No history of DVT  Pulmonary: No home oxygen, no productive cough, no hemoptysis, No asthma or wheezing  Musculoskeletal: _0  Arthritis, _1  Low back pain, _2  Joint pain  Hematologic:No history of hypercoagulable state. No history of easy bleeding. No history of anemia  Gastrointestinal: No hematochezia or melena, No gastroesophageal reflux, no trouble swallowing  Urinary: _3  chronic Kidney disease, _4  on HD - _5  MWF or _6  TTHS, _7  Burning with urination, _8  Frequent urination, _9  Difficulty urinating;  Skin: Chronic diffuse macular skin rash under evaluation by dermatology thought to be atopic dermatitis  Psychological: No history of anxiety, No history of depression  Physical Examination   Vitals:   07/22/19 1610 07/22/19 1640 07/22/19 1710 07/22/19 1740  BP: 113/63 121/61 120/63 111/69  Pulse: 84 81 66 63  Resp:      Temp:      TempSrc:      SpO2: 97% 96% 97% 97%  Weight:      Height:       General: Alert and oriented, no acute distress  HEENT: Normal  Neck: No JVD  Pulmonary: Clear to auscultation bilaterally  Cardiac: Regular Rate and Rhythm  Abdomen: Soft, non-tender, non-distended, no mass  Skin: Diffuse 8 mm circular erosive skin lesions arms legs torso   Extremity Pulses: 2+ radial, brachial, faint femoral, absent popliteal dorsalis pedis, posterior tibial pulses bilaterally  Musculoskeletal: No deformity or edema  Neurologic: Upper and lower extremity motor 5/5 and symmetric  DATA:  Reviewed the patient's recent ultrasound of the abdominal aorta as per history of present illness  ASSESSMENT: Small abdominal aortic aneurysm 3.2 cm diameter and a fairly young individual with family history of aneurysm. Left hip pain with abnormal pulse exam possible hip and buttock claudication   PLAN: Aortogram with runoff possible iliac intervention today   Ruta Hinds, MD  [facility]  Office: [phone]  Pager: [phone]

## 2019-07-25 MED FILL — Heparin Sod (Porcine)-NaCl IV Soln 1000 Unit/500ML-0.9%: INTRAVENOUS | Qty: 1000 | Status: AC

## 2019-08-02 ENCOUNTER — Other Ambulatory Visit: Payer: Self-pay | Admitting: Endocrinology

## 2019-08-02 ENCOUNTER — Other Ambulatory Visit: Payer: Self-pay | Admitting: Family Medicine

## 2019-08-17 ENCOUNTER — Other Ambulatory Visit: Payer: Self-pay | Admitting: *Deleted

## 2019-08-17 DIAGNOSIS — I739 Peripheral vascular disease, unspecified: Secondary | ICD-10-CM

## 2019-08-24 ENCOUNTER — Telehealth (HOSPITAL_COMMUNITY): Payer: Self-pay

## 2019-08-24 NOTE — Telephone Encounter (Signed)

## 2019-08-25 ENCOUNTER — Ambulatory Visit (INDEPENDENT_AMBULATORY_CARE_PROVIDER_SITE_OTHER): Payer: 59 | Admitting: Vascular Surgery

## 2019-08-25 ENCOUNTER — Other Ambulatory Visit: Payer: Self-pay

## 2019-08-25 ENCOUNTER — Ambulatory Visit (HOSPITAL_COMMUNITY)
Admission: RE | Admit: 2019-08-25 | Discharge: 2019-08-25 | Disposition: A | Discharge status: Home / Self Care | Payer: 59 | Source: Ambulatory Visit | Attending: Vascular Surgery | Admitting: Vascular Surgery

## 2019-08-25 ENCOUNTER — Encounter: Payer: Self-pay | Admitting: Vascular Surgery

## 2019-08-25 VITALS — BP 115/69 | HR 56 | Temp 98.0°F | Resp 18 | Ht 68.0 in | Wt 169.0 lb

## 2019-08-25 DIAGNOSIS — I714 Abdominal aortic aneurysm, without rupture, unspecified: Secondary | ICD-10-CM

## 2019-08-25 DIAGNOSIS — I739 Peripheral vascular disease, unspecified: Secondary | ICD-10-CM | POA: Diagnosis not present

## 2019-08-25 NOTE — Progress Notes (Signed)
Patient is a 48-year-old male who returns for postoperative follow-up today.  He recently underwent stenting of his left common iliac artery with a 9 x 39 VBX stent.  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  Since he was not having any symptoms this was not treated.  He also has a known infrarenal abdominal aortic aneurysm about 3.5 cm diameter.  His claudication symptoms have resolved.  He has no abdominal or back pain.  He has continued to refrain from smoking.  He is on a statin and takes 81 mg of aspirin Monday Wednesday Friday.  Review of systems: He has no shortness of breath.  He has no chest pain.  Current Outpatient Medications on File Prior to Visit  Medication Sig Dispense Refill  . ACCU-CHEK GUIDE test strip USE TO CHECK BLOOD SUGARS TWICE DAILY 100 each 3  . acyclovir (ZOVIRAX) 200 MG capsule TAKE 1 CAPSULE BY MOUTH 3 TIMES DAILY FOR 3 TO 5 DAYS AS NEEDED. (Patient taking differently: Take 200 mg by mouth 3 (three) times daily as needed (fever blisters). For up to 5 days) 30 capsule 3  . aspirin EC 81 MG tablet Take 1 tablet (81 mg total) by mouth every Monday, Wednesday, and Friday.    . BAYER MICROLET LANCETS lancets Use as instructed to check blood sugar twice a day dx E11.65 100 each 3  . Blood Glucose Monitoring Suppl (ACCU-CHEK GUIDE) w/Device KIT 1 kit by Does not apply route 2 (two) times daily. Use to check blood sugar twice a day 1 kit 2  . busPIRone (BUSPAR) 10 MG tablet TAKE 1 TABLET BY MOUTH 2 TIMES DAILY. 180 tablet 2  . canagliflozin (INVOKANA) 300 MG TABS tablet Take 1 tablet by mouth once daily. (Patient taking differently: Take 300 mg by mouth daily. ) 90 tablet 4  . citalopram (CELEXA) 20 MG tablet TAKE 1 TABLET BY MOUTH DAILY. (Patient taking differently: Take 20 mg by mouth daily. ) 30 tablet 5  . dupilumab (DUPIXENT) 300 MG/2ML prefilled syringe Inject 300 mg into the skin every 14 (fourteen) days.     . glipiZIDE (GLUCOTROL XL) 10  MG 24 hr tablet TAKE 1 TABLET DAILY WITH BREAKFAST BY MOUTH. (Patient taking differently: Take 10 mg by mouth daily with breakfast. ) 30 tablet 3  . halobetasol (ULTRAVATE) 0.05 % cream Apply 1 application topically 2 (two) times daily. Can cover with Saran wrap fo r30 min.  DO NOT apply to face/groin.    . hydrOXYzine (ATARAX/VISTARIL) 25 MG tablet Take 25 mg by mouth 3 (three) times daily as needed for itching.   0  . ibuprofen (ADVIL) 200 MG tablet Take 400 mg by mouth every 6 (six) hours as needed for headache or moderate pain.    . pantoprazole (PROTONIX) 20 MG tablet TAKE 1 TABLET BY MOUTH DAILY. (Patient taking differently: Take 20 mg by mouth daily as needed for heartburn. ) 90 tablet 2  . pioglitazone (ACTOS) 45 MG tablet TAKE 1 TABLET BY MOUTH DAILY. (Patient taking differently: Take 45 mg by mouth daily. ) 30 tablet 3  . simvastatin (ZOCOR) 80 MG tablet TAKE 1 TABLET BY MOUTH DAILY. (Patient taking differently: Take 80 mg by mouth at bedtime. ) 90 tablet 3  . sitaGLIPtin (JANUVIA) 100 MG tablet Take 1 tablet (100 mg total) by mouth daily. 30 tablet 3  . traZODone (DESYREL) 150 MG tablet TAKE 1/2 TABLET (75 MG TOTAL) BY MOUTH AT   BEDTIME. (Patient taking differently: Take 75 mg by mouth at bedtime. ) 45 tablet 3   No current facility-administered medications on file prior to visit.    Physical exam:  Vitals:   08/25/19 0844  BP: 115/69  Pulse: (!) 56  Resp: 18  Temp: 98 F (36.7 C)  SpO2: 98%  Weight: 169 lb (76.7 kg)  Height: 5' 8" (1.727 m)   Extremities: 2+ dorsalis pedis pulses bilaterally, 2+ femoral pulses no evidence of pseudoaneurysm  Abdomen: Soft nontender nondistended no significant pulsatile mass  Data: Patient had bilateral ABIs performed today.  Left side ABI has improved from 0.75 to 0.98.  Right side ABI has slightly decreased from 1.0 to .85.  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  If he develops claudication symptoms on the right leg he would be a candidate for stenting of this as well.  Abdominal aortic aneurysm 3 cm diameter on CT scan December 2020.  Plan: Follow-up 1 year with aortic ultrasound and bilateral ABIs in our APP clinic.  He will call sooner if he has any new problems.  Charles Fields, MD [facility] Office: [phone]  

## 2019-08-30 ENCOUNTER — Other Ambulatory Visit: Payer: Self-pay | Admitting: *Deleted

## 2019-08-30 DIAGNOSIS — I714 Abdominal aortic aneurysm, without rupture, unspecified: Secondary | ICD-10-CM

## 2019-08-30 DIAGNOSIS — I739 Peripheral vascular disease, unspecified: Secondary | ICD-10-CM

## 2019-09-06 ENCOUNTER — Other Ambulatory Visit: Payer: Self-pay | Admitting: Endocrinology

## 2019-09-06 ENCOUNTER — Other Ambulatory Visit: Payer: Self-pay | Admitting: Family Medicine

## 2019-09-06 NOTE — Telephone Encounter (Signed)
Per OV note, 03/14/19, pt started on doxepin 50 mg in place trazodone.  Spoke with pt asking what he is taking.  States he went back to trazodone.  E-scribed refill.

## 2019-09-07 LAB — HM DIABETES EYE EXAM

## 2019-09-21 ENCOUNTER — Encounter: Payer: Self-pay | Admitting: Family Medicine

## 2019-09-21 DIAGNOSIS — E1151 Type 2 diabetes mellitus with diabetic peripheral angiopathy without gangrene: Secondary | ICD-10-CM | POA: Insufficient documentation

## 2019-10-17 ENCOUNTER — Other Ambulatory Visit: Payer: Self-pay | Admitting: Endocrinology

## 2019-10-17 ENCOUNTER — Other Ambulatory Visit: Payer: Self-pay | Admitting: Family Medicine

## 2019-10-18 ENCOUNTER — Other Ambulatory Visit: Payer: Self-pay

## 2019-10-18 ENCOUNTER — Other Ambulatory Visit (INDEPENDENT_AMBULATORY_CARE_PROVIDER_SITE_OTHER): Payer: 59

## 2019-10-18 DIAGNOSIS — E1165 Type 2 diabetes mellitus with hyperglycemia: Secondary | ICD-10-CM

## 2019-10-18 LAB — COMPREHENSIVE METABOLIC PANEL
ALT: 23 U/L (ref 0–53)
AST: 18 U/L (ref 0–37)
Albumin: 4.1 g/dL (ref 3.5–5.2)
Alkaline Phosphatase: 71 U/L (ref 39–117)
BUN: 19 mg/dL (ref 6–23)
CO2: 29 mEq/L (ref 19–32)
Calcium: 8.6 mg/dL (ref 8.4–10.5)
Chloride: 104 mEq/L (ref 96–112)
Creatinine, Ser: 1.13 mg/dL (ref 0.40–1.50)
GFR: 68.77 mL/min (ref >60.00)
Glucose, Bld: 286 mg/dL — ABNORMAL HIGH (ref 70–99)
Potassium: 4 mEq/L (ref 3.5–5.1)
Sodium: 137 mEq/L (ref 135–145)
Total Bilirubin: 0.5 mg/dL (ref 0.2–1.2)
Total Protein: 6.1 g/dL (ref 6.0–8.3)

## 2019-10-18 LAB — HEMOGLOBIN A1C: Hgb A1c MFr Bld: 9.1 % — ABNORMAL HIGH (ref 4.6–6.5)

## 2019-10-21 ENCOUNTER — Ambulatory Visit (INDEPENDENT_AMBULATORY_CARE_PROVIDER_SITE_OTHER): Payer: 59 | Admitting: Endocrinology

## 2019-10-21 ENCOUNTER — Encounter: Payer: Self-pay | Admitting: Endocrinology

## 2019-10-21 ENCOUNTER — Other Ambulatory Visit: Payer: Self-pay

## 2019-10-21 VITALS — BP 120/76 | HR 57 | Ht 68.0 in | Wt 171.0 lb

## 2019-10-21 DIAGNOSIS — E1165 Type 2 diabetes mellitus with hyperglycemia: Secondary | ICD-10-CM

## 2019-10-21 MED ORDER — TRULICITY 0.75 MG/0.5ML ~~LOC~~ SOAJ: SUBCUTANEOUS | 0 refills | Status: DC

## 2019-10-21 MED ORDER — PIOGLITAZONE HCL 45 MG PO TABS: 45.0000 mg | ORAL_TABLET | Freq: Every day | ORAL | 0 refills | Status: DC

## 2019-10-21 NOTE — Progress Notes (Signed)
Patient ID: Andrew Hart, male   DOB: 30-Jun-1970, 49 y.o.   MRN: [id number]           Reason for Appointment: Follow-up for Type 2 Diabetes     History of Present Illness:          Date of diagnosis of type 2 diabetes mellitus: 2007 ?        Background history:  He was diagnosed with routine screening lab from his work He was initially tried on metformin but this caused diarrhea and he could not continue this Subsequently was treated with glipizide and apparently this was able to control his blood sugars fairly well for a few years Probably in early 2016 he was given Actos in addition to help his glucose control as A1c had gone up to 9.4 This helped transiently and A1c was 7.7 However his A1c had been otherwise over 8% since mid 2015 Because of  high blood sugars on his initial consultation in 05/2014 (A1c was 9.1) he was started on Invokana and Onglyza in addition to his Actos and glipizide.   Recent history:   Non-insulin hypoglycemic drugs: Glipizide ER 10 mg daily, Actos 45 mg daily and Invokana 300 mg daily, Januvia 100 mg  His A1c higher than usual at 9.1  Current blood sugar patterns and problems identified:  He is reportedly taking all his medications including 300 mg Invokana, but has not been seen since 12/20  Not clear why his blood sugars are higher  His blood sugar has been monitored only rarely at home and in the last month has only checked it today and it was about 185 fasting  Blood sugar after lunch on the lab was 286  Recently has no change in his weight  He is generally active with his work  However still eating out some but he is trying to reduce fast foods  He has been out of his Actos for about a week       Side effects from medications have been: Diarrhea from regular metformin, nausea and constipation from Rybelsus  Hypoglycemia: None   Glucose monitoring:  Done once a day or less       Glucometer:  Accu-Chek   Blood Glucose readings as  above  Previous readings:  PRE-MEAL Fasting Lunch Dinner Bedtime Overall  Glucose range:  130-163   77  103-171   Mean/median:     ?     Dietician visit, most recent: 06/2015               Exercise: Mostly when he is active at work  Weight history:   Wt Readings from Last 3 Encounters:  10/21/19 171 lb (77.6 kg)  08/25/19 169 lb (76.7 kg)  07/22/19 170 lb (77.1 kg)    Glycemic control:    Lab Results  Component Value Date   HGBA1C 9.1 (H) 10/18/2019   HGBA1C 7.8 (H) 01/17/2019   HGBA1C 7.5 (H) 10/19/2018   Lab Results  Component Value Date   MICROALBUR <0.7 03/07/2019   LDLCALC 86 03/07/2019   CREATININE 1.13 10/18/2019    Lab on 10/18/2019  Component Date Value Ref Range Status  . Sodium 10/18/2019 137  135 - 145 mEq/L Final  . Potassium 10/18/2019 4.0  3.5 - 5.1 mEq/L Final  . Chloride 10/18/2019 104  96 - 112 mEq/L Final  . CO2 10/18/2019 29  19 - 32 mEq/L Final  . Glucose, Bld 10/18/2019 286* 70 - 99 mg/dL Final  .  BUN 10/18/2019 19  6 - 23 mg/dL Final  . Creatinine, Ser 10/18/2019 1.13  0.40 - 1.50 mg/dL Final  . Total Bilirubin 10/18/2019 0.5  0.2 - 1.2 mg/dL Final  . Alkaline Phosphatase 10/18/2019 71  39 - 117 U/L Final  . AST 10/18/2019 18  0 - 37 U/L Final  . ALT 10/18/2019 23  0 - 53 U/L Final  . Total Protein 10/18/2019 6.1  6.0 - 8.3 g/dL Final  . Albumin 10/18/2019 4.1  3.5 - 5.2 g/dL Final  . GFR 10/18/2019 68.77  >60.00 mL/min Final  . Calcium 10/18/2019 8.6  8.4 - 10.5 mg/dL Final  . Hgb A1c MFr Bld 10/18/2019 9.1* 4.6 - 6.5 % Final   Glycemic Control Guidelines for People with Diabetes:Non Diabetic:  <6%Goal of Therapy: <7%Additional Action Suggested:  >8%       Allergies as of 10/21/2019      Reactions   Janumet Xr [sitagliptin-metformin Hcl Er] Rash   Tolerates the plain Januvia    Jardiance [empagliflozin] Rash   Metformin And Related Diarrhea   Methotrexate Derivatives Other (See Comments)   transaminitis   Other Rash    Methyldibromo Gluteronitrile - skin product preservative      Medication List       Accurate as of October 21, 2019 11:29 AM. If you have any questions, ask your nurse or doctor.        Accu-Chek Guide test strip Generic drug: glucose blood USE TO CHECK BLOOD SUGARS TWICE DAILY   Accu-Chek Guide w/Device Kit 1 kit by Does not apply route 2 (two) times daily. Use to check blood sugar twice a day   acyclovir 200 MG capsule Commonly known as: ZOVIRAX TAKE 1 CAPSULE BY MOUTH 3 TIMES DAILY FOR 3 TO 5 DAYS AS NEEDED. What changed: See the new instructions.   aspirin EC 81 MG tablet Take 1 tablet (81 mg total) by mouth every Monday, Wednesday, and Friday.   Bayer Microlet Lancets lancets Use as instructed to check blood sugar twice a day dx E11.65   busPIRone 10 MG tablet Commonly known as: BUSPAR TAKE 1 TABLET BY MOUTH 2 TIMES DAILY.   canagliflozin 300 MG Tabs tablet Commonly known as: Invokana Take 1 tablet by mouth once daily. What changed:   how much to take  how to take this  when to take this  additional instructions   citalopram 20 MG tablet Commonly known as: CELEXA TAKE 1 TABLET BY MOUTH DAILY.   dupilumab 300 MG/2ML prefilled syringe Commonly known as: DUPIXENT Inject 300 mg into the skin every 14 (fourteen) days.   glipiZIDE 10 MG 24 hr tablet Commonly known as: GLUCOTROL XL Take 1 tablet (10 mg total) by mouth daily with breakfast. **Needs appt for further refills**   halobetasol 0.05 % cream Commonly known as: ULTRAVATE Apply 1 application topically 2 (two) times daily. Can cover with Saran wrap fo r30 min.  DO NOT apply to face/groin.   hydrOXYzine 25 MG tablet Commonly known as: ATARAX/VISTARIL Take 25 mg by mouth 3 (three) times daily as needed for itching.   ibuprofen 200 MG tablet Commonly known as: ADVIL Take 400 mg by mouth every 6 (six) hours as needed for headache or moderate pain.   pantoprazole 20 MG tablet Commonly known as:  PROTONIX TAKE 1 TABLET BY MOUTH DAILY. What changed:   when to take this  reasons to take this   pioglitazone 45 MG tablet Commonly known as: ACTOS Take 1  tablet (45 mg total) by mouth daily. **Needs appt for further refills**   simvastatin 80 MG tablet Commonly known as: ZOCOR TAKE 1 TABLET BY MOUTH DAILY. What changed: when to take this   sitaGLIPtin 100 MG tablet Commonly known as: Januvia Take 1 tablet (100 mg total) by mouth daily. **Needs appt for further refills**   traZODone 150 MG tablet Commonly known as: DESYREL TAKE 1/2 TABLET (75 MG TOTAL) BY MOUTH AT BEDTIME.   Trulicity 6.31 SH/7.50YO Sopn Generic drug: Dulaglutide Inject in the abdominal skin as directed once a week Started by: Elayne Snare, MD       Allergies:  Allergies  Allergen Reactions  . Janumet Xr [Sitagliptin-Metformin Hcl Er] Rash    Tolerates the plain Januvia   . Jardiance [Empagliflozin] Rash  . Metformin And Related Diarrhea  . Methotrexate Derivatives Other (See Comments)    transaminitis  . Other Rash    Methyldibromo Gluteronitrile - skin product preservative    Past Medical History:  Diagnosis Date  . Alcohol use 11/08/2015  . Diabetes mellitus without complication ([place]) 3785   Lakiesha Ralphs  . GERD (gastroesophageal reflux disease)   . Herpes labialis   . History of chicken pox   . Hyperlipidemia     Past Surgical History:  Procedure Laterality Date  . ABDOMINAL AORTOGRAM W/LOWER EXTREMITY N/A 07/22/2019   Procedure: ABDOMINAL AORTOGRAM W/LOWER EXTREMITY;  Surgeon: Elam Dutch, MD;  Location: [facility];  Service: Cardiovascular;  Laterality: N/A;  . APPENDECTOMY    . FINGER SURGERY Right    2 screws in ring finger  . PERIPHERAL VASCULAR INTERVENTION Left 07/22/2019   Procedure: PERIPHERAL VASCULAR INTERVENTION;  Surgeon: Elam Dutch, MD;  Location: [facility];  Service: Cardiovascular;  Laterality: Left;  . TONSILLECTOMY      Family History  Problem  Relation Age of Onset  . Diabetes Mother   . Parkinsonism Mother 52       progressive supranuclear palsy, deceased  . CAD Father 20       MI at 34, CABG at 19  . Diabetes Maternal Grandmother   . Stroke Neg Hx   . Cancer Neg Hx     Social History:  reports that he has been smoking cigarettes. He has a 7.50 pack-year smoking history. He has never used smokeless tobacco. He reports current alcohol use. He reports current drug use.    Review of Systems    Lipid history:  Has been on 80 mg simvastatin for treatment, prescribed by PCP  His lipids have been well controlled    Lab Results  Component Value Date   CHOL 155 03/07/2019   HDL 38.40 (L) 03/07/2019   LDLCALC 86 03/07/2019   TRIG 152.0 (H) 03/07/2019   CHOLHDL 4 03/07/2019           No recent liver dysfunction  Lab Results  Component Value Date   ALT 23 10/18/2019    Most recent foot exam: 3/20  He is still on Dupixent every 2 weeks from his dermatologist for his rash  He has been reluctant to take the Covid vaccine  Review of Systems     Physical Examination:  BP 120/76 (BP Location: Left Arm, Patient Position: Sitting, Cuff Size: Normal)   Pulse (!) 57   Ht '5\' 8"'  (1.727 m)   Wt 171 lb (77.6 kg)   SpO2 97%   BMI 26.00 kg/m     ASSESSMENT:  Diabetes type 2, non-insulin-dependent  See history of present illness for detailed discussion of current diabetes management, blood sugar patterns and problems identified  His A1c is 9.1, was 7.8    Even with multiple medications including Invokana his blood sugars are getting higher indicating progression of his diabetes  He has not monitored blood sugars much at all and still not motivated Postprandial reading was 286  Although he is trying to avoid fast food he is frequently still eating out Overall physical activity level is good  LIPIDS: Last profile was done in 11/20, currently on simvastatin from the PCP  PLAN:    He will restart checking  blood sugars regularly and discussed targets for before and after meals  Although he had intolerance to Rybelsus he is not having many other options except GLP-1 drugs or insulin Showed him the Trulicity autoinjector and he thinks he can do this once a week without difficulty Discussed with the patient the nature of GLP-1 drugs, the action on various organ systems, improved satiety, how they benefit blood glucose control, as well as the benefit of weight loss. Explained possible side effects of TRULICITY, most commonly nausea that usually improves over time; discussed safety information in package insert.  Demonstrated the medication injection device and injection technique to the patient.  He will use his abdomen laterally to inject  To start with 0.75 mg dosage weekly for the first 4 injections and then reassess dose, and to call if he has any difficulties  Patient brochure on Trulicity with 36-UYQ free trial card given  We will need to follow-up in 1 month to make sure his blood sugars are improving Needs to have normal regular follow-up  Patient education information given on Covid vaccines and encouraged him to take this  There are no Patient Instructions on file for this visit.   Elayne Snare 10/21/2019, 11:29 AM   Note: This office note was prepared with Dragon voice recognition system technology. Any transcriptional errors that result from this process are unintentional.

## 2019-10-24 ENCOUNTER — Other Ambulatory Visit: Payer: Self-pay | Admitting: Endocrinology

## 2019-10-24 DIAGNOSIS — E1165 Type 2 diabetes mellitus with hyperglycemia: Secondary | ICD-10-CM

## 2019-11-14 ENCOUNTER — Other Ambulatory Visit: Payer: Self-pay | Admitting: Endocrinology

## 2019-11-28 ENCOUNTER — Encounter: Payer: Self-pay | Admitting: Endocrinology

## 2019-11-28 ENCOUNTER — Other Ambulatory Visit: Payer: Self-pay

## 2019-11-28 ENCOUNTER — Ambulatory Visit (INDEPENDENT_AMBULATORY_CARE_PROVIDER_SITE_OTHER): Payer: 59 | Admitting: Endocrinology

## 2019-11-28 VITALS — BP 118/80 | HR 67 | Ht 68.0 in | Wt 168.2 lb

## 2019-11-28 DIAGNOSIS — E785 Hyperlipidemia, unspecified: Secondary | ICD-10-CM | POA: Diagnosis not present

## 2019-11-28 DIAGNOSIS — E1165 Type 2 diabetes mellitus with hyperglycemia: Secondary | ICD-10-CM

## 2019-11-28 DIAGNOSIS — E1169 Type 2 diabetes mellitus with other specified complication: Secondary | ICD-10-CM

## 2019-11-28 NOTE — Progress Notes (Signed)
Patient ID: Andrew Hart, male   DOB: October 14, 1970, 49 y.o.   MRN: [id number]           Reason for Appointment: Follow-up for Type 2 Diabetes    History of Present Illness:          Date of diagnosis of type 2 diabetes mellitus: 2007 ?        Background history:  He was diagnosed with routine screening lab from his work He was initially tried on metformin but this caused diarrhea and he could not continue this Subsequently was treated with glipizide and apparently this was able to control his blood sugars fairly well for a few years Probably in early 2016 he was given Actos in addition to help his glucose control as A1c had gone up to 9.4 This helped transiently and A1c was 7.7 However his A1c had been otherwise over 8% since mid 2015 Because of  high blood sugars on his initial consultation in 05/2014 (A1c was 9.1) he was started on Invokana and Onglyza in addition to his Actos and glipizide.   Recent history:   Non-insulin hypoglycemic drugs: Trulicity 1.49 mg weekly, glipizide ER 10 mg daily, Actos 45 mg daily and Invokana 300 mg daily, Januvia 100 mg  His A1c in 6/21 was than usual at 9.1  Current blood sugar patterns and problems identified:  He is continuing Trulicity that was started in 6/21  He has done fairly well with this with minor side effects: He says that for couple of days after the injection he feels tired.  Also may have had more reflux symptoms  Also started having some blurred vision with the change in blood sugars  He is able to cut back on portions weight is slightly below  Also overall blood sugars are fairly good with only one unusually high reading after lunch  Most of the time he is forgetting to check his sugars after meals  Did not come for his labs for fructosamine assessment       Side effects from medications have been: Diarrhea from regular metformin, nausea and constipation from Rybelsus   Glucose monitoring:  Done once a day or less        Glucometer: Contour   Blood Glucose readings   PRE-MEAL Fasting Lunch Dinner Bedtime Overall  Glucose range:  111-135  92-123   102-178   Mean/median:  120    133 134   POST-MEAL PC Breakfast PC Lunch PC Dinner  Glucose range:   240   Mean/median:       Dietician visit, most recent: 06/2015  Weight history:   Wt Readings from Last 3 Encounters:  11/28/19 168 lb 3.2 oz (76.3 kg)  10/21/19 171 lb (77.6 kg)  08/25/19 169 lb (76.7 kg)    Glycemic control:    Lab Results  Component Value Date   HGBA1C 9.1 (H) 10/18/2019   HGBA1C 7.8 (H) 01/17/2019   HGBA1C 7.5 (H) 10/19/2018   Lab Results  Component Value Date   MICROALBUR <0.7 03/07/2019   [place] 86 03/07/2019   CREATININE 1.13 10/18/2019    No visits with results within 1 Week(s) from this visit.  Latest known visit with results is:  Lab on 10/18/2019  Component Date Value Ref Range Status  . Sodium 10/18/2019 137  135 - 145 mEq/L Final  . Potassium 10/18/2019 4.0  3.5 - 5.1 mEq/L Final  . Chloride 10/18/2019 104  96 - 112 mEq/L Final  . CO2 10/18/2019  29  19 - 32 mEq/L Final  . Glucose, Bld 10/18/2019 286* 70 - 99 mg/dL Final  . BUN 10/18/2019 19  6 - 23 mg/dL Final  . Creatinine, Ser 10/18/2019 1.13  0.40 - 1.50 mg/dL Final  . Total Bilirubin 10/18/2019 0.5  0.2 - 1.2 mg/dL Final  . Alkaline Phosphatase 10/18/2019 71  39 - 117 U/L Final  . AST 10/18/2019 18  0 - 37 U/L Final  . ALT 10/18/2019 23  0 - 53 U/L Final  . Total Protein 10/18/2019 6.1  6.0 - 8.3 g/dL Final  . Albumin 10/18/2019 4.1  3.5 - 5.2 g/dL Final  . GFR 10/18/2019 68.77  >60.00 mL/min Final  . Calcium 10/18/2019 8.6  8.4 - 10.5 mg/dL Final  . Hgb A1c MFr Bld 10/18/2019 9.1* 4.6 - 6.5 % Final   Glycemic Control Guidelines for People with Diabetes:Non Diabetic:  <6%Goal of Therapy: <7%Additional Action Suggested:  >8%       Allergies as of 11/28/2019      Reactions   Janumet Xr [sitagliptin-metformin Hcl Er] Rash   Tolerates the plain  Januvia    Jardiance [empagliflozin] Rash   Metformin And Related Diarrhea   Methotrexate Derivatives Other (See Comments)   transaminitis   Other Rash   Methyldibromo Gluteronitrile - skin product preservative      Medication List       Accurate as of November 28, 2019  9:41 AM. If you have any questions, ask your nurse or doctor.        Accu-Chek Guide test strip Generic drug: glucose blood USE TO CHECK BLOOD SUGARS TWICE DAILY   Accu-Chek Guide w/Device Kit 1 kit by Does not apply route 2 (two) times daily. Use to check blood sugar twice a day   acyclovir 200 MG capsule Commonly known as: ZOVIRAX TAKE 1 CAPSULE BY MOUTH 3 TIMES DAILY FOR 3 TO 5 DAYS AS NEEDED. What changed: See the new instructions.   aspirin EC 81 MG tablet Take 1 tablet (81 mg total) by mouth every Monday, Wednesday, and Friday.   Bayer Microlet Lancets lancets Use as instructed to check blood sugar twice a day dx E11.65   busPIRone 10 MG tablet Commonly known as: BUSPAR TAKE 1 TABLET BY MOUTH 2 TIMES DAILY.   canagliflozin 300 MG Tabs tablet Commonly known as: Invokana Take 1 tablet by mouth once daily. What changed:   how much to take  how to take this  when to take this  additional instructions   citalopram 20 MG tablet Commonly known as: CELEXA TAKE 1 TABLET BY MOUTH DAILY.   dupilumab 300 MG/2ML prefilled syringe Commonly known as: DUPIXENT Inject 300 mg into the skin every 14 (fourteen) days.   glipiZIDE 10 MG 24 hr tablet Commonly known as: GLUCOTROL XL Take 1 tablet by mouth daily with Breakfast   halobetasol 0.05 % cream Commonly known as: ULTRAVATE Apply 1 application topically 2 (two) times daily. Can cover with Saran wrap fo r30 min.  DO NOT apply to face/groin.   hydrOXYzine 25 MG tablet Commonly known as: ATARAX/VISTARIL Take 25 mg by mouth 3 (three) times daily as needed for itching.   ibuprofen 200 MG tablet Commonly known as: ADVIL Take 400 mg by mouth every 6  (six) hours as needed for headache or moderate pain.   pantoprazole 20 MG tablet Commonly known as: PROTONIX TAKE 1 TABLET BY MOUTH DAILY. What changed:   when to take this  reasons to take  this   pioglitazone 45 MG tablet Commonly known as: ACTOS Take 1 tablet (45 mg total) by mouth daily.   simvastatin 80 MG tablet Commonly known as: ZOCOR TAKE 1 TABLET BY MOUTH DAILY. What changed: when to take this   sitaGLIPtin 100 MG tablet Commonly known as: Januvia Take 1 tablet (100 mg total) by mouth daily.   traZODone 150 MG tablet Commonly known as: DESYREL TAKE 1/2 TABLET (75 MG TOTAL) BY MOUTH AT BEDTIME.   Trulicity 6.16 WV/3.7TG Sopn Generic drug: Dulaglutide INJECT IN THE ABDOMINAL SKIN AS DIRECTED ONCE A WEEK       Allergies:  Allergies  Allergen Reactions  . Janumet Xr [Sitagliptin-Metformin Hcl Er] Rash    Tolerates the plain Januvia   . Jardiance [Empagliflozin] Rash  . Metformin And Related Diarrhea  . Methotrexate Derivatives Other (See Comments)    transaminitis  . Other Rash    Methyldibromo Gluteronitrile - skin product preservative    Past Medical History:  Diagnosis Date  . Alcohol use 11/08/2015  . Diabetes mellitus without complication ([place]) 6269   Dorthie Santini  . GERD (gastroesophageal reflux disease)   . Herpes labialis   . History of chicken pox   . Hyperlipidemia     Past Surgical History:  Procedure Laterality Date  . ABDOMINAL AORTOGRAM W/LOWER EXTREMITY N/A 07/22/2019   Procedure: ABDOMINAL AORTOGRAM W/LOWER EXTREMITY;  Surgeon: Elam Dutch, MD;  Location: [facility];  Service: Cardiovascular;  Laterality: N/A;  . APPENDECTOMY    . FINGER SURGERY Right    2 screws in ring finger  . PERIPHERAL VASCULAR INTERVENTION Left 07/22/2019   Procedure: PERIPHERAL VASCULAR INTERVENTION;  Surgeon: Elam Dutch, MD;  Location: [facility];  Service: Cardiovascular;  Laterality: Left;  . TONSILLECTOMY      Family History    Problem Relation Age of Onset  . Diabetes Mother   . Parkinsonism Mother 25       progressive supranuclear palsy, deceased  . CAD Father 46       MI at 61, CABG at 76  . Diabetes Maternal Grandmother   . Stroke Neg Hx   . Cancer Neg Hx     Social History:  reports that he has been smoking cigarettes. He has a 7.50 pack-year smoking history. He has never used smokeless tobacco. He reports current alcohol use. He reports current drug use.    Review of Systems    Lipid history:  Has been on 80 mg simvastatin for treatment, prescribed by PCP  His lipids have been well controlled    Lab Results  Component Value Date   CHOL 155 03/07/2019   HDL 38.40 (L) 03/07/2019   LDLCALC 86 03/07/2019   TRIG 152.0 (H) 03/07/2019   CHOLHDL 4 03/07/2019         Lab Results  Component Value Date   ALT 23 10/18/2019    Most recent foot exam: 7/21 No numbness in his feet  He has been reluctant to take the Covid vaccine but has no specific concerns about it.  He has had longstanding reflux symptoms taking Protonix as needed, more recently taking it regularly because of starting Trulicity  No history of hypertension   Review of Systems     Physical Examination:  BP 118/80 (BP Location: Left Arm, Patient Position: Sitting, Cuff Size: Large)   Pulse 67   Ht '5\' 8"'  (1.727 m)   Wt 168 lb 3.2 oz (76.3 kg)   SpO2 95%  BMI 25.57 kg/m   Diabetic Foot Exam - Simple   Simple Foot Form Diabetic Foot exam was performed with the following findings: Yes   Visual Inspection No deformities, no ulcerations, no other skin breakdown bilaterally: Yes Sensation Testing Intact to touch and monofilament testing bilaterally: Yes Pulse Check Posterior Tibialis and Dorsalis pulse intact bilaterally: Yes Comments      ASSESSMENT:  Diabetes type 2, non-insulin-dependent  See history of present illness for detailed discussion of current diabetes management, blood sugar patterns and problems  identified  His A1c is last 9.1 and higher than usual   With adding only 7.53 mg of Trulicity he has had improved blood sugars However he did not come for his labs to assess fructosamine Has occasional readings done after lunch or at bedtime but not enough to assess his level of control Has lost 3 pounds since starting Trulicity with better portion control However not doing much formal exercise   LIPIDS: Last profile was done in 11/20, currently on simvastatin from the PCP  No signs of neuropathy on exam  PLAN:    For now we will continue 0.05 mg of Trulicity since he has had reportedly fatigue and increased reflux from this He has no difficulty with the injection otherwise Blood sugars do need to be checked after meals regularly A1c to be checked on the next visit  Again discussed in detail the necessity and safety of Covid vaccines and encouraged him to schedule this.  Stressed the importance of protection against delta variant  There are no Patient Instructions on file for this visit.   Elayne Snare 11/28/2019, 9:41 AM   Note: This office note was prepared with Dragon voice recognition system technology. Any transcriptional errors that result from this process are unintentional.

## 2019-11-28 NOTE — Patient Instructions (Signed)
Check blood sugars on waking up 3 days a week  Also check blood sugars about 2 hours after meals and do this after different meals by rotation  Recommended blood sugar levels on waking up are 90-130 and about 2 hours after meal is 130-160  Please bring your blood sugar monitor to each visit, thank you  Stop Januvia

## 2019-12-10 ENCOUNTER — Other Ambulatory Visit: Payer: Self-pay | Admitting: Endocrinology

## 2019-12-15 ENCOUNTER — Encounter: Payer: Self-pay | Admitting: Family Medicine

## 2019-12-15 ENCOUNTER — Other Ambulatory Visit: Payer: Self-pay

## 2019-12-15 ENCOUNTER — Encounter: Payer: Self-pay | Admitting: Gastroenterology

## 2019-12-15 ENCOUNTER — Ambulatory Visit (INDEPENDENT_AMBULATORY_CARE_PROVIDER_SITE_OTHER): Payer: 59 | Admitting: Family Medicine

## 2019-12-15 VITALS — BP 124/68 | HR 72 | Temp 97.6°F | Ht 68.0 in | Wt 168.0 lb

## 2019-12-15 DIAGNOSIS — E1165 Type 2 diabetes mellitus with hyperglycemia: Secondary | ICD-10-CM

## 2019-12-15 DIAGNOSIS — I700 Atherosclerosis of aorta: Secondary | ICD-10-CM | POA: Diagnosis not present

## 2019-12-15 DIAGNOSIS — I708 Atherosclerosis of other arteries: Secondary | ICD-10-CM

## 2019-12-15 DIAGNOSIS — E1169 Type 2 diabetes mellitus with other specified complication: Secondary | ICD-10-CM | POA: Diagnosis not present

## 2019-12-15 DIAGNOSIS — Z2821 Immunization not carried out because of patient refusal: Secondary | ICD-10-CM | POA: Insufficient documentation

## 2019-12-15 DIAGNOSIS — Z87891 Personal history of nicotine dependence: Secondary | ICD-10-CM

## 2019-12-15 DIAGNOSIS — E785 Hyperlipidemia, unspecified: Secondary | ICD-10-CM

## 2019-12-15 DIAGNOSIS — Z1211 Encounter for screening for malignant neoplasm of colon: Secondary | ICD-10-CM | POA: Diagnosis not present

## 2019-12-15 NOTE — Assessment & Plan Note (Signed)
Appreciate [facility] care - continue aspirin, simvastatin.

## 2019-12-15 NOTE — Assessment & Plan Note (Signed)
Detailed discussion about his concerns regarding COVID vaccine. I did recommend vaccine as best way to protect from severe illness hospitalization and death from COVID.

## 2019-12-15 NOTE — Patient Instructions (Addendum)
We will refer you for colonoscopy - expect a call from our or their office to schedule. I do recommend the COVID shots - let me know if questions about them.

## 2019-12-15 NOTE — Assessment & Plan Note (Signed)
Congratulated on smoking cessation 05/05/2019

## 2019-12-15 NOTE — Assessment & Plan Note (Signed)
Continue endo f/u.  

## 2019-12-15 NOTE — Progress Notes (Signed)
This visit was conducted in person.  BP 124/68   Pulse 72   Temp 97.6 F (36.4 C)   Ht 5' 8" (1.727 m)   Wt 168 lb (76.2 kg)   SpO2 96%   BMI 25.54 kg/m    CC: discuss colonoscopy Subjective:    Patient ID: Andrew Hart, male    DOB: 07/05/1970, 49 y.o.   MRN: [id number]  HPI: Andrew Hart is a 49 y.o. male presenting on 12/15/2019 for Follow-up (colonoscopy referral)   Quit smoking 05/05/2019!  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  DM - followed by endo seen last month. Now on trulicity, continues invokana, glipizide XL and actos 73m daily.  Lab Results  Component Value Date   HGBA1C 9.1 (H) 10/18/2019   Continues simvastatin 874mdaily, tolerating well.   Would like to discuss colonoscopy. Requests referral today to try and get done 2021.   COVID vaccine - declines      Relevant past medical, surgical, family and social history reviewed and updated as indicated. Interim medical history since our last visit reviewed. Allergies and medications reviewed and updated. Outpatient Medications Prior to Visit  Medication Sig Dispense Refill  . ACCU-CHEK GUIDE test strip USE TO CHECK BLOOD SUGARS TWICE DAILY 100 each 3  . acyclovir (ZOVIRAX) 200 MG capsule TAKE 1 CAPSULE BY MOUTH 3 TIMES DAILY FOR 3 TO 5 DAYS AS NEEDED. (Patient taking differently: Take 200 mg by mouth 3 (three) times daily as needed (fever blisters). For up to 5 days) 30 capsule 3  . aspirin EC 81 MG tablet Take 1 tablet (81 mg total) by mouth every Monday, Wednesday, and Friday.    . Marland KitchenAYER MICROLET LANCETS lancets Use as instructed to check blood sugar twice a day dx E11.65 100 each 3  . Blood Glucose Monitoring Suppl (ACCU-CHEK GUIDE) w/Device KIT 1 kit by Does not apply route 2 (two) times daily. Use to check blood sugar twice a day 1 kit 2  . busPIRone (BUSPAR) 10 MG tablet TAKE 1 TABLET BY MOUTH 2 TIMES DAILY. 180 tablet 2  .  canagliflozin (INVOKANA) 300 MG TABS tablet Take 1 tablet by mouth once daily. (Patient taking differently: Take 300 mg by mouth daily. ) 90 tablet 4  . citalopram (CELEXA) 20 MG tablet TAKE 1 TABLET BY MOUTH DAILY. 90 tablet 1  . dupilumab (DUPIXENT) 300 MG/2ML prefilled syringe Inject 300 mg into the skin every 14 (fourteen) days.     . Marland KitchenlipiZIDE (GLUCOTROL XL) 10 MG 24 hr tablet Take 1 tablet by mouth daily with Breakfast 30 tablet 1  . halobetasol (ULTRAVATE) 0.05 % cream Apply 1 application topically 2 (two) times daily. Can cover with Saran wrap fo r30 min.  DO NOT apply to face/groin.    . hydrOXYzine (ATARAX/VISTARIL) 25 MG tablet Take 25 mg by mouth 3 (three) times daily as needed for itching.   0  . ibuprofen (ADVIL) 200 MG tablet Take 400 mg by mouth every 6 (six) hours as needed for headache or moderate pain.    . pantoprazole (PROTONIX) 20 MG tablet TAKE 1 TABLET BY MOUTH DAILY. (Patient taking differently: Take 20 mg by mouth daily as needed for heartburn. ) 90 tablet 2  . pioglitazone (ACTOS) 45 MG tablet Take 1 tablet (45 mg total) by mouth daily. 90 tablet 0  . simvastatin (ZOCOR) 80 MG tablet TAKE 1 TABLET BY MOUTH DAILY. (Patient taking differently:  Take 80 mg by mouth at bedtime. ) 90 tablet 3  . traZODone (DESYREL) 150 MG tablet TAKE 1/2 TABLET (75 MG TOTAL) BY MOUTH AT BEDTIME. 45 tablet 2  . TRULICITY 2.02 RK/2.7CW SOPN INJECT IN THE ABDOMINAL SKIN AS DIRECTED ONCE A WEEK 2 mL 2  . sitaGLIPtin (JANUVIA) 100 MG tablet Take 1 tablet (100 mg total) by mouth daily. (Patient not taking: Reported on 12/15/2019) 30 tablet 1   No facility-administered medications prior to visit.     Per HPI unless specifically indicated in ROS section below Review of Systems Objective:  BP 124/68   Pulse 72   Temp 97.6 F (36.4 C)   Ht 5' 8" (1.727 m)   Wt 168 lb (76.2 kg)   SpO2 96%   BMI 25.54 kg/m   Wt Readings from Last 3 Encounters:  12/15/19 168 lb (76.2 kg)  11/28/19 168 lb 3.2 oz  (76.3 kg)  10/21/19 171 lb (77.6 kg)      Physical Exam Vitals and nursing note reviewed.  Constitutional:      Appearance: Normal appearance. He is not ill-appearing.  Neurological:     Mental Status: He is alert.       Results for orders placed or performed in visit on 10/18/19  Comprehensive metabolic panel  Result Value Ref Range   Sodium 137 135 - 145 mEq/L   Potassium 4.0 3.5 - 5.1 mEq/L   Chloride 104 96 - 112 mEq/L   CO2 29 19 - 32 mEq/L   Glucose, Bld 286 (H) 70 - 99 mg/dL   BUN 19 6 - 23 mg/dL   Creatinine, Ser 1.13 0.40 - 1.50 mg/dL   Total Bilirubin 0.5 0.2 - 1.2 mg/dL   Alkaline Phosphatase 71 39 - 117 U/L   AST 18 0 - 37 U/L   ALT 23 0 - 53 U/L   Total Protein 6.1 6.0 - 8.3 g/dL   Albumin 4.1 3.5 - 5.2 g/dL   GFR 68.77 >60.00 mL/min   Calcium 8.6 8.4 - 10.5 mg/dL  Hemoglobin A1c  Result Value Ref Range   Hgb A1c MFr Bld 9.1 (H) 4.6 - 6.5 %   Lab Results  Component Value Date   CHOL 155 03/07/2019   HDL 38.40 (L) 03/07/2019   LDLCALC 86 03/07/2019   TRIG 152.0 (H) 03/07/2019   CHOLHDL 4 03/07/2019    Assessment & Plan:  This visit occurred during the SARS-CoV-2 public health emergency.  Safety protocols were in place, including screening questions prior to the visit, additional usage of staff PPE, and extensive cleaning of exam room while observing appropriate contact time as indicated for disinfecting solutions.  Referred for colonoscopy.  Problem List Items Addressed This Visit    Uncontrolled type 2 diabetes mellitus with hyperglycemia, without long-term current use of insulin ([place])    Continue endo f/u      Hyperlipidemia associated with type 2 diabetes mellitus ([place])    Continues high dose simvastatin (longstanding), seems to be tolerating well. Lab Results  Component Value Date   LDLCALC 86 03/07/2019   The 10-year ASCVD risk score Mikey Bussing DC Jr., et al., 2013) is: 5.2%   Values used to calculate the score:     Age: 50 years     Sex: Male      Is Non-Hispanic African American: No     Diabetic: Yes     Tobacco smoker: No     Systolic Blood Pressure: 237 mmHg  Is BP treated: No     HDL Cholesterol: 38.4 mg/dL     Total Cholesterol: 155 mg/dL       Ex-smoker    Congratulated on smoking cessation 05/05/2019      COVID-19 virus vaccination declined    Detailed discussion about his concerns regarding COVID vaccine. I did recommend vaccine as best way to protect from severe illness hospitalization and death from [place].       Aorto-iliac atherosclerosis ([place]) - Primary    Appreciate [facility] care - continue aspirin, simvastatin.        Other Visit Diagnoses    Special screening for malignant neoplasms, colon       Relevant Orders   Ambulatory referral to Gastroenterology       No orders of the defined types were placed in this encounter.  Orders Placed This Encounter  Procedures  . Ambulatory referral to Gastroenterology    Referral Priority:   Routine    Referral Type:   Consultation    Referral Reason:   Specialty Services Required    Number of Visits Requested:   1   Patient Instructions  We will refer you for colonoscopy - expect a call from our or their office to schedule. I do recommend the COVID shots - let me know if questions about them.     Follow up plan: Return if symptoms worsen or fail to improve.  Ria Bush, MD

## 2019-12-15 NOTE — Assessment & Plan Note (Signed)
Continues high dose simvastatin (longstanding), seems to be tolerating well. Lab Results  Component Value Date   LDLCALC 86 03/07/2019   The 10-year ASCVD risk score Denman George DC Montez Hageman., et al., 2013) is: 5.2%   Values used to calculate the score:     Age: 49 years     Sex: Male     Is Non-Hispanic African American: No     Diabetic: Yes     Tobacco smoker: No     Systolic Blood Pressure: 124 mmHg     Is BP treated: No     HDL Cholesterol: 38.4 mg/dL     Total Cholesterol: 155 mg/dL

## 2019-12-24 ENCOUNTER — Other Ambulatory Visit: Payer: Self-pay | Admitting: Endocrinology

## 2019-12-24 DIAGNOSIS — E1165 Type 2 diabetes mellitus with hyperglycemia: Secondary | ICD-10-CM

## 2019-12-28 ENCOUNTER — Other Ambulatory Visit: Payer: Self-pay | Admitting: Endocrinology

## 2020-01-05 ENCOUNTER — Telehealth: Payer: Self-pay | Admitting: Endocrinology

## 2020-01-05 NOTE — Telephone Encounter (Signed)
Spoke with patient regarding physician's instruction's.Patient has not had his Covid 19 vaccine yet. Patient verbalized understanding and did not have any questions.

## 2020-01-05 NOTE — Telephone Encounter (Signed)
If he has 2 of the 0.75 mg pens leftover he can take them both together.  Subsequently will need 1.5 mg weekly prescription of the Trulicity.  He may get a little more reflux with this.  Also check to see if he has taken the Covid vaccine as recommended

## 2020-01-05 NOTE — Telephone Encounter (Signed)
Patient requests to be called at ph# [phone] re: Patient requests to increase dosage of Trulicity due to blood sugars have been running high (highest bs recently 195).

## 2020-01-05 NOTE — Telephone Encounter (Signed)
Are you taking any type of steroids? no  Are you sick or becoming sick? no Is this the first elevated blood sugar reading? no  Have you tried to correct it with insulin? no  Have you been taking/taken today all of the prescribed medications? yes  What is your current diabetic insulin or oral medication dosage? Trulicity 75 mg once weekly   Date Time Reading Notes  01/02/20 9pm 192   12/29/19 9am 142 fasting  12/19/19 10am 134   12/15/19 940am 165 fasting  12/08/19 830  131 Fasting- a day or two after not taking Januvia                                       Please advise

## 2020-01-06 NOTE — Telephone Encounter (Signed)
Patient called asking if Dr Lucianne Muss was going to call in his new dosage of the Trulicity?  [facility] - [place], [place] - [address] Phone:  [phone]  Fax:  [phone]

## 2020-01-10 ENCOUNTER — Other Ambulatory Visit: Payer: Self-pay | Admitting: Endocrinology

## 2020-01-10 MED ORDER — TRULICITY 1.5 MG/0.5ML ~~LOC~~ SOAJ: SUBCUTANEOUS | 1 refills | Status: DC

## 2020-01-10 NOTE — Telephone Encounter (Signed)
I have called in the new doses.  Previous instructions are being to take 2 injections of the 0.75 mg together until finished

## 2020-01-10 NOTE — Telephone Encounter (Signed)
Left message for patient advising prescription has been called in.

## 2020-01-24 ENCOUNTER — Other Ambulatory Visit: Payer: Self-pay | Admitting: Endocrinology

## 2020-01-24 ENCOUNTER — Other Ambulatory Visit: Payer: Self-pay | Admitting: Family Medicine

## 2020-01-30 ENCOUNTER — Other Ambulatory Visit: Payer: Self-pay

## 2020-01-30 ENCOUNTER — Other Ambulatory Visit (INDEPENDENT_AMBULATORY_CARE_PROVIDER_SITE_OTHER): Payer: 59

## 2020-01-30 DIAGNOSIS — E1169 Type 2 diabetes mellitus with other specified complication: Secondary | ICD-10-CM | POA: Diagnosis not present

## 2020-01-30 DIAGNOSIS — E1165 Type 2 diabetes mellitus with hyperglycemia: Secondary | ICD-10-CM

## 2020-01-30 DIAGNOSIS — E785 Hyperlipidemia, unspecified: Secondary | ICD-10-CM | POA: Diagnosis not present

## 2020-01-30 LAB — COMPREHENSIVE METABOLIC PANEL
ALT: 19 U/L (ref 0–53)
AST: 16 U/L (ref 0–37)
Albumin: 4.2 g/dL (ref 3.5–5.2)
Alkaline Phosphatase: 71 U/L (ref 39–117)
BUN: 22 mg/dL (ref 6–23)
CO2: 28 mEq/L (ref 19–32)
Calcium: 9.1 mg/dL (ref 8.4–10.5)
Chloride: 104 mEq/L (ref 96–112)
Creatinine, Ser: 1.13 mg/dL (ref 0.40–1.50)
GFR: 68.69 mL/min (ref >60.00)
Glucose, Bld: 170 mg/dL — ABNORMAL HIGH (ref 70–99)
Potassium: 4.6 mEq/L (ref 3.5–5.1)
Sodium: 138 mEq/L (ref 135–145)
Total Bilirubin: 0.7 mg/dL (ref 0.2–1.2)
Total Protein: 6.5 g/dL (ref 6.0–8.3)

## 2020-01-30 LAB — MICROALBUMIN / CREATININE URINE RATIO
Creatinine,U: 74.3 mg/dL
Microalb Creat Ratio: 0.9 mg/g (ref 0.0–30.0)
Microalb, Ur: <0.7 mg/dL (ref 0.0–1.9)

## 2020-01-30 LAB — LIPID PANEL
Cholesterol: 136 mg/dL (ref 0–200)
HDL: 40.5 mg/dL (ref >39.00)
LDL Cholesterol: 73 mg/dL (ref 0–99)
NonHDL: 95.7
Total CHOL/HDL Ratio: 3
Triglycerides: 112 mg/dL (ref 0.0–149.0)
VLDL: 22.4 mg/dL (ref 0.0–40.0)

## 2020-01-30 LAB — HEMOGLOBIN A1C: Hgb A1c MFr Bld: 7.7 % — ABNORMAL HIGH (ref 4.6–6.5)

## 2020-01-31 LAB — FRUCTOSAMINE: Fructosamine: 270 umol/L (ref 0–285)

## 2020-02-01 ENCOUNTER — Ambulatory Visit (INDEPENDENT_AMBULATORY_CARE_PROVIDER_SITE_OTHER): Payer: 59 | Admitting: Endocrinology

## 2020-02-01 ENCOUNTER — Other Ambulatory Visit: Payer: Self-pay

## 2020-02-01 VITALS — BP 118/78 | HR 72 | Ht 68.0 in | Wt 164.8 lb

## 2020-02-01 DIAGNOSIS — K219 Gastro-esophageal reflux disease without esophagitis: Secondary | ICD-10-CM | POA: Diagnosis not present

## 2020-02-01 DIAGNOSIS — E785 Hyperlipidemia, unspecified: Secondary | ICD-10-CM

## 2020-02-01 DIAGNOSIS — E1169 Type 2 diabetes mellitus with other specified complication: Secondary | ICD-10-CM | POA: Diagnosis not present

## 2020-02-01 DIAGNOSIS — E1165 Type 2 diabetes mellitus with hyperglycemia: Secondary | ICD-10-CM

## 2020-02-01 MED ORDER — GLUCOSE BLOOD VI STRP: ORAL_STRIP | 12 refills | Status: DC

## 2020-02-01 NOTE — Patient Instructions (Signed)
Check blood sugars on waking up 2-3 days a week  Also check blood sugars about 2 hours after meals and do this after different meals by rotation  Recommended blood sugar levels on waking up are 90-130 and about 2 hours after meal is 130-160  Please bring your blood sugar monitor to each visit, thank you   

## 2020-02-01 NOTE — Progress Notes (Signed)
Patient ID: Andrew Hart, male   DOB: 07/10/1970, 49 y.o.   MRN: [id number]           Reason for Appointment: Follow-up for Type 2 Diabetes    History of Present Illness:          Date of diagnosis of type 2 diabetes mellitus: 2007 ?        Background history:  He was diagnosed with routine screening lab from his work He was initially tried on metformin but this caused diarrhea and he could not continue this Subsequently was treated with glipizide and apparently this was able to control his blood sugars fairly well for a few years Probably in early 2016 he was given Actos in addition to help his glucose control as A1c had gone up to 9.4 This helped transiently and A1c was 7.7 However his A1c had been otherwise over 8% since mid 2015 Because of  high blood sugars on his initial consultation in 05/2014 (A1c was 9.1) he was started on Invokana and Onglyza in addition to his Actos and glipizide.   Recent history:   Non-insulin hypoglycemic drugs: Trulicity 4.19 mg weekly, glipizide ER 10 mg daily, Actos 45 mg daily, Invokana 300 mg daily  His A1c in 6/21 was 9.1 and now back down to 7.7 Fructosamine is 270  Current blood sugar patterns and problems identified:  He started having improved blood sugars with adding 3.79 mg Trulicity in June  This was further increased to 1.5 mg since blood sugars were still relatively high, up to 192 after meals and 165 fasting in August  He has taken 2 injections of the 1.5 mg but has not checked his blood sugars much lately  Highest reading in the last month at home has been only 127, all readings done in the morning lately  Blood sugar was 170 in the lab but he had some regular soft drink before this  He has lost 4 pounds with increase satiety using the Trulicity and he does not think that this is an excessive weight loss  He does get some reflux with the Trulicity but takes Protonix in the evenings when he has symptoms       Side effects  from medications have been: Diarrhea from regular metformin, nausea and constipation from Rybelsus   Glucose monitoring:  Done once a day or less       Glucometer: Contour   Blood Glucose readings  Recent readings: 99-127 with only 5 readings in the last month in the morning  Previous settings: Select PRE-MEAL Fasting Lunch Dinner Bedtime Overall  Glucose range:  111-135  92-123   102-178   Mean/median:  120    133 134   POST-MEAL PC Breakfast PC Lunch PC Dinner  Glucose range:   240   Mean/median:       Dietician visit, most recent: 06/2015  Weight history:   Wt Readings from Last 3 Encounters:  02/01/20 164 lb 12.8 oz (74.8 kg)  12/15/19 168 lb (76.2 kg)  11/28/19 168 lb 3.2 oz (76.3 kg)    Glycemic control:    Lab Results  Component Value Date   HGBA1C 7.7 (H) 01/30/2020   HGBA1C 9.1 (H) 10/18/2019   HGBA1C 7.8 (H) 01/17/2019   Lab Results  Component Value Date   MICROALBUR <0.7 01/30/2020   LDLCALC 73 01/30/2020   CREATININE 1.13 01/30/2020    Lab on 01/30/2020  Component Date Value Ref Range Status  . Cholesterol 01/30/2020 136  0 - 200 mg/dL Final   ATP III Classification       Desirable:  < 200 mg/dL               Borderline High:  200 - 239 mg/dL          High:  > = 240 mg/dL  . Triglycerides 01/30/2020 112.0  0 - 149 mg/dL Final   Normal:  <150 mg/dLBorderline High:  150 - 199 mg/dL  . HDL 01/30/2020 40.50  >39.00 mg/dL Final  . VLDL 01/30/2020 22.4  0.0 - 40.0 mg/dL Final  . LDL Cholesterol 01/30/2020 73  0 - 99 mg/dL Final  . Total CHOL/HDL Ratio 01/30/2020 3   Final                  Men          Women1/2 Average Risk     3.4          3.3Average Risk          5.0          4.42X Average Risk          9.6          7.13X Average Risk          15.0          11.0                      . NonHDL 01/30/2020 95.70   Final   NOTE:  Non-HDL goal should be 30 mg/dL higher than patient's LDL goal (i.e. LDL goal of < 70 mg/dL, would have non-HDL goal of < 100  mg/dL)  . Microalb, Ur 01/30/2020 <0.7  0.0 - 1.9 mg/dL Final  . Creatinine,U 01/30/2020 74.3  mg/dL Final  . Microalb Creat Ratio 01/30/2020 0.9  0.0 - 30.0 mg/g Final  . Sodium 01/30/2020 138  135 - 145 mEq/L Final  . Potassium 01/30/2020 4.6  3.5 - 5.1 mEq/L Final  . Chloride 01/30/2020 104  96 - 112 mEq/L Final  . CO2 01/30/2020 28  19 - 32 mEq/L Final  . Glucose, Bld 01/30/2020 170* 70 - 99 mg/dL Final  . BUN 01/30/2020 22  6 - 23 mg/dL Final  . Creatinine, Ser 01/30/2020 1.13  0.40 - 1.50 mg/dL Final  . Total Bilirubin 01/30/2020 0.7  0.2 - 1.2 mg/dL Final  . Alkaline Phosphatase 01/30/2020 71  39 - 117 U/L Final  . AST 01/30/2020 16  0 - 37 U/L Final  . ALT 01/30/2020 19  0 - 53 U/L Final  . Total Protein 01/30/2020 6.5  6.0 - 8.3 g/dL Final  . Albumin 01/30/2020 4.2  3.5 - 5.2 g/dL Final  . GFR 01/30/2020 68.69  >60.00 mL/min Final  . Calcium 01/30/2020 9.1  8.4 - 10.5 mg/dL Final  . Hgb A1c MFr Bld 01/30/2020 7.7* 4.6 - 6.5 % Final   Glycemic Control Guidelines for People with Diabetes:Non Diabetic:  <6%Goal of Therapy: <7%Additional Action Suggested:  >8%   . Fructosamine 01/30/2020 270  0 - 285 umol/L Final   Comment: Published reference interval for apparently healthy subjects between age 94 and 33 is 6 - 285 umol/L and in a poorly controlled diabetic population is 228 - 563 umol/L with a mean of 396 umol/L.       Allergies as of 02/01/2020      Reactions   Janumet Xr [sitagliptin-metformin Hcl Er] Rash   Tolerates  the plain Januvia    Jardiance [empagliflozin] Rash   Metformin And Related Diarrhea   Methotrexate Derivatives Other (See Comments)   transaminitis   Other Rash   Methyldibromo Gluteronitrile - skin product preservative      Medication List       Accurate as of February 01, 2020  8:30 PM. If you have any questions, ask your nurse or doctor.        acyclovir 200 MG capsule Commonly known as: ZOVIRAX TAKE 1 CAPSULE BY MOUTH 3 TIMES DAILY FOR  3 TO 5 DAYS AS NEEDED. What changed: See the new instructions.   aspirin EC 81 MG tablet Take 1 tablet (81 mg total) by mouth every Monday, Wednesday, and Friday.   Bayer Microlet Lancets lancets Use as instructed to check blood sugar twice a day dx E11.65   busPIRone 10 MG tablet Commonly known as: BUSPAR TAKE 1 TABLET BY MOUTH 2 TIMES DAILY.   canagliflozin 300 MG Tabs tablet Commonly known as: Invokana Take 1 tablet by mouth once daily. What changed:   how much to take  how to take this  when to take this  additional instructions   citalopram 20 MG tablet Commonly known as: CELEXA TAKE 1 TABLET BY MOUTH DAILY.   Contour Next Monitor w/Device Kit CHECK BLOOD SUGAR TWICE DAILY   Contour Next Test test strip Generic drug: glucose blood USE TO CHECK BLOOD SUGARS TWICE DAILY What changed: Another medication with the same name was added. Make sure you understand how and when to take each. Changed by: Elayne Snare, MD   glucose blood test strip Check sugar once a daily What changed: You were already taking a medication with the same name, and this prescription was added. Make sure you understand how and when to take each. Changed by: Elayne Snare, MD   dupilumab 300 MG/2ML prefilled syringe Commonly known as: DUPIXENT Inject 300 mg into the skin every 14 (fourteen) days.   glipiZIDE 10 MG 24 hr tablet Commonly known as: GLUCOTROL XL TAKE 1 TABLET BY MOUTH DAILY WITH BREAKFAST   halobetasol 0.05 % cream Commonly known as: ULTRAVATE Apply 1 application topically 2 (two) times daily. Can cover with Saran wrap fo r30 min.  DO NOT apply to face/groin.   hydrOXYzine 25 MG tablet Commonly known as: ATARAX/VISTARIL Take 25 mg by mouth 3 (three) times daily as needed for itching.   ibuprofen 200 MG tablet Commonly known as: ADVIL Take 400 mg by mouth every 6 (six) hours as needed for headache or moderate pain.   pantoprazole 20 MG tablet Commonly known as:  PROTONIX TAKE 1 TABLET BY MOUTH DAILY.   pioglitazone 45 MG tablet Commonly known as: ACTOS TAKE 1 TABLET BY MOUTH DAILY.   simvastatin 80 MG tablet Commonly known as: ZOCOR TAKE 1 TABLET BY MOUTH DAILY. What changed: when to take this   traZODone 150 MG tablet Commonly known as: DESYREL TAKE 1/2 TABLET (75 MG TOTAL) BY MOUTH AT BEDTIME.   Trulicity 1.5 OH/6.0VP Sopn Generic drug: Dulaglutide Inject weekly       Allergies:  Allergies  Allergen Reactions  . Janumet Xr [Sitagliptin-Metformin Hcl Er] Rash    Tolerates the plain Januvia   . Jardiance [Empagliflozin] Rash  . Metformin And Related Diarrhea  . Methotrexate Derivatives Other (See Comments)    transaminitis  . Other Rash    Methyldibromo Gluteronitrile - skin product preservative    Past Medical History:  Diagnosis Date  . Alcohol use  11/08/2015  . Diabetes mellitus without complication ([place]) 7322   Syesha Thaw  . GERD (gastroesophageal reflux disease)   . Herpes labialis   . History of chicken pox   . Hyperlipidemia     Past Surgical History:  Procedure Laterality Date  . ABDOMINAL AORTOGRAM W/LOWER EXTREMITY N/A 07/22/2019   Procedure: ABDOMINAL AORTOGRAM W/LOWER EXTREMITY;  Surgeon: Elam Dutch, MD;  Location: [facility];  Service: Cardiovascular;  Laterality: N/A;  . APPENDECTOMY    . FINGER SURGERY Right    2 screws in ring finger  . PERIPHERAL VASCULAR INTERVENTION Left 07/22/2019   Procedure: PERIPHERAL VASCULAR INTERVENTION;  Surgeon: Elam Dutch, MD;  Location: [facility];  Service: Cardiovascular;  Laterality: Left;  . TONSILLECTOMY      Family History  Problem Relation Age of Onset  . Diabetes Mother   . Parkinsonism Mother 4       progressive supranuclear palsy, deceased  . CAD Father 31       MI at 31, CABG at 55  . Diabetes Maternal Grandmother   . Stroke Neg Hx   . Cancer Neg Hx     Social History:  reports that he quit smoking about a year ago. His  smoking use included cigarettes. He has a 7.50 pack-year smoking history. He has never used smokeless tobacco. He reports previous alcohol use. He reports previous drug use.    Review of Systems    Lipid history:  Has been on 80 mg simvastatin for treatment, prescribed by PCP  His lipids are consistently well controlled No change in liver functions   Lab Results  Component Value Date   CHOL 136 01/30/2020   HDL 40.50 01/30/2020   LDLCALC 73 01/30/2020   TRIG 112.0 01/30/2020   CHOLHDL 3 01/30/2020         Lab Results  Component Value Date   ALT 19 01/30/2020    Most recent foot exam: 7/21 No numbness in his feet  He has been refusing to take the Covid vaccine despite explanation of benefits and safety Also refuses influenza vaccine  He has had longstanding reflux symptoms taking Protonix as needed  No history of hypertension   Review of Systems     Physical Examination:  BP 118/78 (BP Location: Right Arm, Patient Position: Sitting, Cuff Size: Normal)   Pulse 72   Ht '5\' 8"'  (1.727 m)   Wt 164 lb 12.8 oz (74.8 kg)   SpO2 95%   BMI 25.06 kg/m    ASSESSMENT:  Diabetes type 2, non-insulin-dependent  See history of present illness for detailed discussion of current diabetes management, blood sugar patterns and problems identified  His A1c is now 7.7 compared to 9.1 Fructosamine is 270  Recently not monitoring blood sugars and off especially after meals however fasting readings are better with increasing Trulicity to 1.5 mg Also continuing Invokana, Actos 45 mg and glipizide  Urine microalbumin normal  LIPIDS: Well-controlled with simvastatin 80 mg, LDL 73 and no change in liver functions  Reflux symptoms: These are chronic but appear to be somewhat worse with Trulicity because of slow gastric emptying  He was offered the flu vaccine but he refuses  Renal function continues to be normal along with potassium with taking Invokana  PLAN:    He will be  given the One Touch Verio monitor which appears to be better covered by his insurance and prescription sent He needs to check at least some readings after meals alternating  with fasting reading Continue Trulicity 1.5 mg daily  May take Protonix before eating in the morning rather than waiting till symptoms occur in the afternoon Reminded him to continue exercise and activity regimen throughout winter also Consistent diet To call if blood sugars are unusually high, given information on blood sugar targets before and after meals Check A1c on next visit   Patient Instructions  Check blood sugars on waking up 2-3  days a week  Also check blood sugars about 2 hours after meals and do this after different meals by rotation  Recommended blood sugar levels on waking up are 90-130 and about 2 hours after meal is 130-160  Please bring your blood sugar monitor to each visit, thank you      Elayne Snare 02/01/2020, 8:30 PM   Note: This office note was prepared with Dragon voice recognition system technology. Any transcriptional errors that result from this process are unintentional.

## 2020-02-02 ENCOUNTER — Ambulatory Visit (AMBULATORY_SURGERY_CENTER): Payer: Self-pay | Admitting: *Deleted

## 2020-02-02 ENCOUNTER — Other Ambulatory Visit: Payer: Self-pay

## 2020-02-02 VITALS — Ht 68.0 in | Wt 166.6 lb

## 2020-02-02 DIAGNOSIS — Z01818 Encounter for other preprocedural examination: Secondary | ICD-10-CM

## 2020-02-02 DIAGNOSIS — Z1211 Encounter for screening for malignant neoplasm of colon: Secondary | ICD-10-CM

## 2020-02-02 MED ORDER — SUPREP BOWEL PREP KIT 17.5-3.13-1.6 GM/177ML PO SOLN: 1.0000 | Freq: Once | ORAL | 0 refills | Status: AC

## 2020-02-02 NOTE — Progress Notes (Signed)
covid test 02-13-20 at 11:00 am  Pt is aware that care partner will wait in the car during procedure; if they feel like they will be too hot or cold to wait in the car; they may wait in the 4 th floor lobby. Patient is aware to bring only one care partner. We want them to wear a mask (we do not have any that we can provide them), practice social distancing, and we will check their temperatures when they get here.  I did remind the patient that their care partner needs to stay in the parking lot the entire time and have a cell phone available, we will call them when the pt is ready for discharge. Patient will wear mask into building.   No egg or soy allergy  No home oxygen use   No medications for weight loss taken  emmi information given  Pt denies constipation issues   No trouble with anesthesia, difficulty with intubation or hx/fam hx of malignant hyperthermia per pt

## 2020-02-13 ENCOUNTER — Other Ambulatory Visit: Payer: Self-pay | Admitting: Gastroenterology

## 2020-02-13 LAB — SARS CORONAVIRUS 2 (TAT 6-24 HRS): SARS Coronavirus 2: NEGATIVE

## 2020-02-16 ENCOUNTER — Ambulatory Visit (AMBULATORY_SURGERY_CENTER): Payer: 59 | Admitting: Gastroenterology

## 2020-02-16 ENCOUNTER — Other Ambulatory Visit: Payer: Self-pay

## 2020-02-16 ENCOUNTER — Encounter: Payer: Self-pay | Admitting: Gastroenterology

## 2020-02-16 VITALS — BP 109/70 | HR 64 | Temp 98.9°F | Resp 11 | Ht 68.0 in | Wt 166.6 lb

## 2020-02-16 DIAGNOSIS — Z1211 Encounter for screening for malignant neoplasm of colon: Secondary | ICD-10-CM | POA: Diagnosis present

## 2020-02-16 MED ORDER — SODIUM CHLORIDE 0.9 % IV SOLN: 500.0000 mL | Freq: Once | INTRAVENOUS | Status: DC

## 2020-02-16 NOTE — Patient Instructions (Signed)
Discharge instructions given. Normal exam. Resume previous medications. YOU HAD AN ENDOSCOPIC PROCEDURE TODAY AT THE [facility]:   Refer to the procedure report that was given to you for any specific questions about what was found during the examination.  If the procedure report does not answer your questions, please call your gastroenterologist to clarify.  If you requested that your care partner not be given the details of your procedure findings, then the procedure report has been included in a sealed envelope for you to review at your convenience later.  YOU SHOULD EXPECT: Some feelings of bloating in the abdomen. Passage of more gas than usual.  Walking can help get rid of the air that was put into your GI tract during the procedure and reduce the bloating. If you had a lower endoscopy (such as a colonoscopy or flexible sigmoidoscopy) you may notice spotting of blood in your stool or on the toilet paper. If you underwent a bowel prep for your procedure, you may not have a normal bowel movement for a few days.  Please Note:  You might notice some irritation and congestion in your nose or some drainage.  This is from the oxygen used during your procedure.  There is no need for concern and it should clear up in a day or so.  SYMPTOMS TO REPORT IMMEDIATELY:  Following lower endoscopy (colonoscopy or flexible sigmoidoscopy):  Excessive amounts of blood in the stool  Significant tenderness or worsening of abdominal pains  Swelling of the abdomen that is new, acute  Fever of 100F or higher   For urgent or emergent issues, a gastroenterologist can be reached at any hour by calling [phone]. Do not use MyChart messaging for urgent concerns.    DIET:  We do recommend a small meal at first, but then you may proceed to your regular diet.  Drink plenty of fluids but you should avoid alcoholic beverages for 24 hours.  ACTIVITY:  You should plan to take it easy for the rest of  today and you should NOT DRIVE or use heavy machinery until tomorrow (because of the sedation medicines used during the test).    FOLLOW UP: Our staff will call the number listed on your records 48-72 hours following your procedure to check on you and address any questions or concerns that you may have regarding the information given to you following your procedure. If we do not reach you, we will leave a message.  We will attempt to reach you two times.  During this call, we will ask if you have developed any symptoms of COVID 19. If you develop any symptoms (ie: fever, flu-like symptoms, shortness of breath, cough etc.) before then, please call [phone].  If you test positive for Covid 19 in the 2 weeks post procedure, please call and report this information to us.    If any biopsies were taken you will be contacted by phone or by letter within the next 1-3 weeks.  Please call us at [phone] if you have not heard about the biopsies in 3 weeks.    SIGNATURES/CONFIDENTIALITY: You and/or your care partner have signed paperwork which will be entered into your electronic medical record.  These signatures attest to the fact that that the information above on your After Visit Summary has been reviewed and is understood.  Full responsibility of the confidentiality of this discharge information lies with you and/or your care-partner.  

## 2020-02-16 NOTE — Progress Notes (Signed)
Pt's states no medical or surgical changes since previsit or office visit. 

## 2020-02-16 NOTE — Progress Notes (Signed)
Report given to PACU, vss 

## 2020-02-16 NOTE — Op Note (Signed)
[facility] Patient Name: Andrew Hart Procedure Date: 02/16/2020 10:47 AM MRN: [id number] Endoscopist: Sherilyn Cooter L. Myrtie Neither , MD Age: 49 Referring MD:  Date of Birth: 15-Jan-1971 Gender: Male Account #: [id number] Procedure:                Colonoscopy Indications:              Screening for colorectal malignant neoplasm, This                            is the patient's first colonoscopy Medicines:                Monitored Anesthesia Care Procedure:                Pre-Anesthesia Assessment:                           - Prior to the procedure, a History and Physical                            was performed, and patient medications and                            allergies were reviewed. The patient's tolerance of                            previous anesthesia was also reviewed. The risks                            and benefits of the procedure and the sedation                            options and risks were discussed with the patient.                            All questions were answered, and informed consent                            was obtained. Prior Anticoagulants: The patient has                            taken no previous anticoagulant or antiplatelet                            agents except for aspirin. ASA Grade Assessment: II                            - A patient with mild systemic disease. After                            reviewing the risks and benefits, the patient was                            deemed in satisfactory condition to undergo the  procedure.                           After obtaining informed consent, the colonoscope                            was passed under direct vision. Throughout the                            procedure, the patient's blood pressure, pulse, and                            oxygen saturations were monitored continuously. The                            Colonoscope was introduced through the anus and                             advanced to the the cecum, identified by                            appendiceal orifice and ileocecal valve. The                            colonoscopy was performed without difficulty. The                            patient tolerated the procedure well. The quality                            of the bowel preparation was excellent. The                            ileocecal valve, appendiceal orifice, and rectum                            were photographed. The bowel preparation used was                            SUPREP. Scope In: 10:55:39 AM Scope Out: 11:07:22 AM Scope Withdrawal Time: 0 hours 8 minutes 40 seconds  Total Procedure Duration: 0 hours 11 minutes 43 seconds  Findings:                 The perianal and digital rectal examinations were                            normal.                           The entire examined colon appeared normal on direct                            and retroflexion views. Complications:            No immediate complications. Estimated Blood Loss:     Estimated blood  loss: none. Impression:               - The entire examined colon is normal on direct and                            retroflexion views.                           - No specimens collected. Recommendation:           - Patient has a contact number available for                            emergencies. The signs and symptoms of potential                            delayed complications were discussed with the                            patient. Return to normal activities tomorrow.                            Written discharge instructions were provided to the                            patient.                           - Resume previous diet.                           - Continue present medications.                           - Repeat colonoscopy in 10 years for screening                            purposes. Dayten Juba L. Myrtie Neither, MD 02/16/2020 11:12:37 AM This report has been  signed electronically.

## 2020-02-20 ENCOUNTER — Telehealth: Payer: Self-pay

## 2020-02-20 NOTE — Telephone Encounter (Signed)
First post procedure follow up call, no answer 

## 2020-02-20 NOTE — Telephone Encounter (Signed)
Left message on 2nd follow up call. 

## 2020-02-28 ENCOUNTER — Other Ambulatory Visit: Payer: Self-pay | Admitting: Endocrinology

## 2020-02-28 DIAGNOSIS — E1165 Type 2 diabetes mellitus with hyperglycemia: Secondary | ICD-10-CM

## 2020-03-05 ENCOUNTER — Other Ambulatory Visit: Payer: Self-pay | Admitting: Endocrinology

## 2020-03-08 ENCOUNTER — Other Ambulatory Visit: Payer: Self-pay | Admitting: Family Medicine

## 2020-03-08 ENCOUNTER — Other Ambulatory Visit (INDEPENDENT_AMBULATORY_CARE_PROVIDER_SITE_OTHER): Payer: BC Managed Care – PPO

## 2020-03-08 ENCOUNTER — Other Ambulatory Visit: Payer: Self-pay

## 2020-03-08 DIAGNOSIS — Z1159 Encounter for screening for other viral diseases: Secondary | ICD-10-CM

## 2020-03-08 DIAGNOSIS — D7589 Other specified diseases of blood and blood-forming organs: Secondary | ICD-10-CM

## 2020-03-08 DIAGNOSIS — E1169 Type 2 diabetes mellitus with other specified complication: Secondary | ICD-10-CM

## 2020-03-08 DIAGNOSIS — E1165 Type 2 diabetes mellitus with hyperglycemia: Secondary | ICD-10-CM

## 2020-03-08 LAB — FOLATE: Folate: 11.7 ng/mL (ref >5.9)

## 2020-03-08 LAB — VITAMIN B12: Vitamin B-12: 309 pg/mL (ref 211–911)

## 2020-03-08 NOTE — Addendum Note (Signed)
Addended by: Shon Millet on: 03/08/2020 09:04 AM   Modules accepted: Orders

## 2020-03-12 LAB — CBC WITH DIFFERENTIAL/PLATELET
Absolute Monocytes: 451 cells/uL (ref 200–950)
Basophils Absolute: 49 cells/uL (ref 0–200)
Basophils Relative: 1.2 %
Eosinophils Absolute: 62 cells/uL (ref 15–500)
Eosinophils Relative: 1.5 %
HCT: 50.2 % — ABNORMAL HIGH (ref 38.5–50.0)
Hemoglobin: 17 g/dL (ref 13.2–17.1)
Lymphs Abs: 1492 cells/uL (ref 850–3900)
MCH: 33.5 pg — ABNORMAL HIGH (ref 27.0–33.0)
MCHC: 33.9 g/dL (ref 32.0–36.0)
MCV: 99 fL (ref 80.0–100.0)
MPV: 11 fL (ref 7.5–12.5)
Monocytes Relative: 11 %
Neutro Abs: 2046 cells/uL (ref 1500–7800)
Neutrophils Relative %: 49.9 %
Platelets: 209 10*3/uL (ref 140–400)
RBC: 5.07 10*6/uL (ref 4.20–5.80)
RDW: 12.8 % (ref 11.0–15.0)
Total Lymphocyte: 36.4 %
WBC: 4.1 10*3/uL (ref 3.8–10.8)

## 2020-03-12 LAB — HEPATITIS C ANTIBODY
Hepatitis C Ab: NONREACTIVE
SIGNAL TO CUT-OFF: 0.01 (ref <1.00)

## 2020-03-12 LAB — PATHOLOGIST SMEAR REVIEW

## 2020-03-15 ENCOUNTER — Encounter: Payer: BC Managed Care – PPO | Admitting: Family Medicine

## 2020-03-19 ENCOUNTER — Other Ambulatory Visit: Payer: Self-pay

## 2020-03-19 ENCOUNTER — Ambulatory Visit (INDEPENDENT_AMBULATORY_CARE_PROVIDER_SITE_OTHER): Payer: 59 | Admitting: Family Medicine

## 2020-03-19 ENCOUNTER — Encounter: Payer: Self-pay | Admitting: Family Medicine

## 2020-03-19 VITALS — BP 118/82 | HR 63 | Temp 98.2°F | Ht 66.5 in | Wt 168.1 lb

## 2020-03-19 DIAGNOSIS — Z23 Encounter for immunization: Secondary | ICD-10-CM

## 2020-03-19 DIAGNOSIS — Z87891 Personal history of nicotine dependence: Secondary | ICD-10-CM | POA: Diagnosis not present

## 2020-03-19 DIAGNOSIS — L281 Prurigo nodularis: Secondary | ICD-10-CM

## 2020-03-19 DIAGNOSIS — E785 Hyperlipidemia, unspecified: Secondary | ICD-10-CM

## 2020-03-19 DIAGNOSIS — Z0000 Encounter for general adult medical examination without abnormal findings: Secondary | ICD-10-CM

## 2020-03-19 DIAGNOSIS — H9313 Tinnitus, bilateral: Secondary | ICD-10-CM | POA: Diagnosis not present

## 2020-03-19 DIAGNOSIS — E1165 Type 2 diabetes mellitus with hyperglycemia: Secondary | ICD-10-CM

## 2020-03-19 DIAGNOSIS — F4322 Adjustment disorder with anxiety: Secondary | ICD-10-CM

## 2020-03-19 DIAGNOSIS — I700 Atherosclerosis of aorta: Secondary | ICD-10-CM

## 2020-03-19 DIAGNOSIS — E1151 Type 2 diabetes mellitus with diabetic peripheral angiopathy without gangrene: Secondary | ICD-10-CM

## 2020-03-19 DIAGNOSIS — H9319 Tinnitus, unspecified ear: Secondary | ICD-10-CM | POA: Insufficient documentation

## 2020-03-19 DIAGNOSIS — I708 Atherosclerosis of other arteries: Secondary | ICD-10-CM

## 2020-03-19 DIAGNOSIS — K219 Gastro-esophageal reflux disease without esophagitis: Secondary | ICD-10-CM

## 2020-03-19 DIAGNOSIS — I77811 Abdominal aortic ectasia: Secondary | ICD-10-CM

## 2020-03-19 DIAGNOSIS — E1169 Type 2 diabetes mellitus with other specified complication: Secondary | ICD-10-CM

## 2020-03-19 LAB — POC URINALSYSI DIPSTICK (AUTOMATED)
Bilirubin, UA: NEGATIVE
Blood, UA: NEGATIVE
Glucose, UA: POSITIVE — AB
Ketones, UA: NEGATIVE
Leukocytes, UA: NEGATIVE
Nitrite, UA: NEGATIVE
Protein, UA: NEGATIVE
Spec Grav, UA: >=1.03 — AB (ref 1.010–1.025)
Urobilinogen, UA: 0.2 E.U./dL
pH, UA: 5 (ref 5.0–8.0)

## 2020-03-19 MED ORDER — B-12 1000 MCG SL SUBL: 1.0000 | SUBLINGUAL_TABLET | Freq: Every day | SUBLINGUAL | Status: DC

## 2020-03-19 MED ORDER — CITALOPRAM HYDROBROMIDE 20 MG PO TABS
20.0000 mg | ORAL_TABLET | Freq: Every day | ORAL | 3 refills | Status: DC
Start: 2020-03-19 — End: 2021-03-20

## 2020-03-19 MED ORDER — TRAZODONE HCL 150 MG PO TABS
ORAL_TABLET | ORAL | 3 refills | Status: DC
Start: 2020-03-19 — End: 2021-03-20

## 2020-03-19 MED ORDER — BUSPIRONE HCL 15 MG PO TABS
15.0000 mg | ORAL_TABLET | Freq: Two times a day (BID) | ORAL | 1 refills | Status: DC
Start: 2020-03-19 — End: 2020-09-26

## 2020-03-19 NOTE — Assessment & Plan Note (Signed)
Followed by [facility] 

## 2020-03-19 NOTE — Addendum Note (Signed)
Addended by: Nanci Pina on: 03/19/2020 10:25 AM   Modules accepted: Orders

## 2020-03-19 NOTE — Assessment & Plan Note (Signed)
Chronic issue. Check hearing screen today. discussed audiology eval.

## 2020-03-19 NOTE — Progress Notes (Signed)
Patient ID: Andrew Hart, male    DOB: 29-Sep-1970, 49 y.o.   MRN: [id number]  This visit was conducted in person.  BP 118/82 (BP Location: Left Arm, Patient Position: Sitting, Cuff Size: Normal)   Pulse 63   Temp 98.2 F (36.8 C) (Temporal)   Ht 5' 6.5" (1.689 m)   Wt 168 lb 2 oz (76.3 kg)   SpO2 96%   BMI 26.73 kg/m     Hearing Screening   _0  _1  _2  _3  _4  _5  _6  _7  _8   Right ear:   40 40 20  40    Left ear:   _9 0      CC: CPE Subjective:   HPI: Andrew Hart is a 49 y.o. 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 Receiving dupixent Q2 wks.   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 Sees [facility] yearly.   DM followed by Dr Dwyane Dee. Started on trulicity with benefit, but notes increasing indigestion managed with daily pantoprazole 61m - doesn't last all day.   Chronic tinnitus for years- sleeps with fan on to help him sleep. Wife notes he has trouble hearing.   Preventative: Colonoscopy 02/2020 - WNL rpt 10 yrs ([facility])  Prostate cancer screening - discussed, will start next year Flu shot yearly Pneumovax 2016 Tdap 2014 COVID vaccine - discussed, will consider  Shingrix - discussed, to consider Seat belt use discussed Sunscreen use discussed. No changing moles on skin.  Smoking - 1/2 ppd x 30 yrs - quit smoking 05/04/2020!  Alcohol -stopped since 08/16/2017 - [place]2017  Dentist Q6 mo Eye exam - yearly   Lives with wife and 2 children (daughter 2003, son 2006)  Occ: owns [employer]  Activity: active at work, at home on yard  Diet: some water, fruits/vegetables daily, lots of soda and gatorade     Relevant past medical, surgical, family and social  history reviewed and updated as indicated. Interim medical history since our last visit reviewed. Allergies and medications reviewed and updated. Outpatient Medications Prior to Visit  Medication Sig Dispense Refill  . acyclovir (ZOVIRAX) 200 MG capsule TAKE 1 CAPSULE BY MOUTH 3 TIMES DAILY FOR 3 TO 5 DAYS AS NEEDED. (Patient taking differently: Take 200 mg by mouth 3 (three) times daily as needed (fever blisters). For up to 5 days) 30 capsule 3  . aspirin EC 81 MG tablet Take 1 tablet (81 mg total) by mouth every Monday, Wednesday, and Friday.    .Marland KitchenBAYER MICROLET LANCETS lancets Use as instructed to check blood sugar twice a day dx E11.65 100 each 3  . Blood Glucose Monitoring Suppl (CONTOUR NEXT MONITOR) w/Device KIT CHECK BLOOD SUGAR TWICE DAILY 1 kit 0  . CONTOUR NEXT TEST test strip USE TO CHECK BLOOD SUGARS TWICE DAILY 100 strip 3  . Dulaglutide (TRULICITY) 1.5 MXQ/1.1HESOPN INJECT 1.5 MG ONCE WEEKLY 2 mL 1  . dupilumab (DUPIXENT) 300 MG/2ML prefilled syringe Inject 300 mg into the skin every 14 (fourteen) days.     .Marland KitchenglipiZIDE (GLUCOTROL XL) 10 MG 24 hr tablet TAKE 1 TABLET BY MOUTH DAILY WITH BREAKFAST 30 tablet 1  . glucose blood test strip Check sugar once a daily 100 each 12  . halobetasol (  ULTRAVATE) 0.05 % cream Apply 1 application topically 2 (two) times daily. Can cover with Saran wrap fo r30 min.  DO NOT apply to face/groin.    . hydrOXYzine (ATARAX/VISTARIL) 25 MG tablet Take 25 mg by mouth 3 (three) times daily as needed for itching.   0  . ibuprofen (ADVIL) 200 MG tablet Take 400 mg by mouth every 6 (six) hours as needed for headache or moderate pain.    . INVOKANA 300 MG TABS tablet TAKE 1 TABLET BY MOUTH ONCE DAILY 30 tablet 4  . pantoprazole (PROTONIX) 20 MG tablet TAKE 1 TABLET BY MOUTH DAILY. 90 tablet 0  . pioglitazone (ACTOS) 45 MG tablet TAKE 1 TABLET BY MOUTH DAILY. 90 tablet 0  . simvastatin (ZOCOR) 80 MG tablet TAKE 1 TABLET BY MOUTH DAILY. (Patient taking  differently: Take 80 mg by mouth at bedtime. ) 90 tablet 3  . busPIRone (BUSPAR) 10 MG tablet TAKE 1 TABLET BY MOUTH 2 TIMES DAILY. 180 tablet 2  . citalopram (CELEXA) 20 MG tablet TAKE 1 TABLET BY MOUTH DAILY. 90 tablet 1  . traZODone (DESYREL) 150 MG tablet TAKE 1/2 TABLET (75 MG TOTAL) BY MOUTH AT BEDTIME. 45 tablet 2   No facility-administered medications prior to visit.     Per HPI unless specifically indicated in ROS section below Review of Systems  Constitutional: Negative for activity change, appetite change, chills, fatigue, fever and unexpected weight change.  HENT: Negative for hearing loss.   Eyes: Negative for visual disturbance.  Respiratory: Negative for cough, chest tightness, shortness of breath and wheezing.   Cardiovascular: Negative for chest pain, palpitations and leg swelling.  Gastrointestinal: Negative for abdominal distention, abdominal pain, blood in stool, constipation, diarrhea, nausea and vomiting.  Genitourinary: Negative for difficulty urinating and hematuria.  Musculoskeletal: Negative for arthralgias, myalgias and neck pain.  Skin: Negative for rash.  Neurological: Negative for dizziness, seizures, syncope and headaches.  Hematological: Negative for adenopathy. Does not bruise/bleed easily.  Psychiatric/Behavioral: Negative for dysphoric mood. The patient is not nervous/anxious.    Objective:  BP 118/82 (BP Location: Left Arm, Patient Position: Sitting, Cuff Size: Normal)   Pulse 63   Temp 98.2 F (36.8 C) (Temporal)   Ht 5' 6.5" (1.689 m)   Wt 168 lb 2 oz (76.3 kg)   SpO2 96%   BMI 26.73 kg/m   Wt Readings from Last 3 Encounters:  03/19/20 168 lb 2 oz (76.3 kg)  02/16/20 166 lb 9.6 oz (75.6 kg)  02/02/20 166 lb 9.6 oz (75.6 kg)      Physical Exam Vitals and nursing note reviewed.  Constitutional:      General: He is not in acute distress.    Appearance: Normal appearance. He is well-developed. He is not ill-appearing.  HENT:     Head:  Normocephalic and atraumatic.     Right Ear: Hearing, tympanic membrane, ear canal and external ear normal.     Left Ear: Hearing, tympanic membrane, ear canal and external ear normal.  Eyes:     General: No scleral icterus.    Extraocular Movements: Extraocular movements intact.     Conjunctiva/sclera: Conjunctivae normal.     Pupils: Pupils are equal, round, and reactive to light.  Neck:     Thyroid: No thyroid mass or thyromegaly.     Vascular: No carotid bruit.  Cardiovascular:     Rate and Rhythm: Normal rate and regular rhythm.     Pulses: Normal pulses.  Radial pulses are 2+ on the right side and 2+ on the left side.     Heart sounds: Normal heart sounds. No murmur heard.   Pulmonary:     Effort: Pulmonary effort is normal. No respiratory distress.     Breath sounds: Normal breath sounds. No wheezing, rhonchi or rales.  Abdominal:     General: Abdomen is flat. Bowel sounds are normal. There is no distension.     Palpations: Abdomen is soft. There is no mass.     Tenderness: There is no abdominal tenderness. There is no guarding or rebound.     Hernia: No hernia is present.  Musculoskeletal:        General: Normal range of motion.     Cervical back: Normal range of motion and neck supple.     Right lower leg: No edema.     Left lower leg: No edema.  Lymphadenopathy:     Cervical: No cervical adenopathy.  Skin:    General: Skin is warm and dry.     Findings: No rash.  Neurological:     General: No focal deficit present.     Mental Status: He is alert and oriented to person, place, and time.     Comments: CN grossly intact, station and gait intact  Psychiatric:        Mood and Affect: Mood normal.        Behavior: Behavior normal.        Thought Content: Thought content normal.        Judgment: Judgment normal.       Results for orders placed or performed in visit on 03/08/20  Vitamin B12  Result Value Ref Range   Vitamin B-12 309 211 - 911 pg/mL  CBC  with Differential/Platelet  Result Value Ref Range   WBC 4.1 3.8 - 10.8 Thousand/uL   RBC 5.07 4.20 - 5.80 Million/uL   Hemoglobin 17.0 13.2 - 17.1 g/dL   HCT 50.2 (H) 38 - 50 %   MCV 99.0 80.0 - 100.0 fL   MCH 33.5 (H) 27.0 - 33.0 pg   MCHC 33.9 32.0 - 36.0 g/dL   RDW 12.8 11.0 - 15.0 %   Platelets 209 140 - 400 Thousand/uL   MPV 11.0 7.5 - 12.5 fL   Neutro Abs 2,046 1,500 - 7,800 cells/uL   Lymphs Abs 1,492 850 - 3,900 cells/uL   Absolute Monocytes 451 200 - 950 cells/uL   Eosinophils Absolute 62 15.0 - 500.0 cells/uL   Basophils Absolute 49 0.0 - 200.0 cells/uL   Neutrophils Relative % 49.9 %   Total Lymphocyte 36.4 %   Monocytes Relative 11.0 %   Eosinophils Relative 1.5 %   Basophils Relative 1.2 %  Folate  Result Value Ref Range   Folate 11.7 >5.9 ng/mL  Pathologist smear review  Result Value Ref Range   Path Review    Hepatitis C antibody  Result Value Ref Range   Hepatitis C Ab NON-REACTIVE NON-REACTI   SIGNAL TO CUT-OFF 0.01 <1.00  No results found for: PSA1, PSA   Lab Results  Component Value Date   CREATININE 1.13 01/30/2020   BUN 22 01/30/2020   NA 138 01/30/2020   K 4.6 01/30/2020   CL 104 01/30/2020   CO2 28 01/30/2020   Assessment & Plan:  This visit occurred during the SARS-CoV-2 public health emergency.  Safety protocols were in place, including screening questions prior to the visit, additional usage of staff PPE, and extensive  cleaning of exam room while observing appropriate contact time as indicated for disinfecting solutions.   Problem List Items Addressed This Visit    Uncontrolled type 2 diabetes mellitus with hyperglycemia, without long-term current use of insulin ([place])    Appreciate endo care. Now on trulicity with improvement in glycemic control noted.       Tinnitus    Chronic issue. Check hearing screen today. discussed audiology eval.       Prurigo nodularis    Now on dupixent through derm with benefit.       Hyperlipidemia  associated with type 2 diabetes mellitus (HCC)    Chronic, stable on simvastatin. FLP reviewed from 01/2020. The 10-year ASCVD risk score Mikey Bussing DC Brooke Bonito., et al., 2013) is: 4.2%   Values used to calculate the score:     Age: 31 years     Sex: Male     Is Non-Hispanic African American: No     Diabetic: Yes     Tobacco smoker: No     Systolic Blood Pressure: 597 mmHg     Is BP treated: No     HDL Cholesterol: 40.5 mg/dL     Total Cholesterol: 136 mg/dL       Health maintenance examination - Primary    Preventative protocols reviewed and updated unless pt declined. Discussed healthy diet and lifestyle.  Encouraged covid vaccine. Discussed shingrix vaccine.       GERD (gastroesophageal reflux disease)    Breakthrough symptoms on pantoprazole 68m - will add pepcid 297mat night time. Update if not controlled to increase PPI dose.       Ex-smoker    Congratulated on smoking cessation.  Not eligible for lung cancer screen.  Check UA today.       Diabetic peripheral vascular disease ([place]  Aorto-iliac atherosclerosis (HCC)    Followed by [facility].       Adjustment disorder with anxiety    Notes increasing anxiety/irritability.  Will increase buspar to 1576mid, monitoring for increased side effects such as dizziness, somnolence.      Abdominal aortic ectasia (HCC)    Followed by [facility].        Other Visit Diagnoses    Need for influenza vaccination       Relevant Orders   Flu Vaccine QUAD 36+ mos IM (Completed)       Meds ordered this encounter  Medications  . Cyanocobalamin (B-12) 1000 MCG SUBL    Sig: Place 1 tablet under the tongue daily.  . busPIRone (BUSPAR) 15 MG tablet    Sig: Take 1 tablet (15 mg total) by mouth 2 (two) times daily.    Dispense:  180 tablet    Refill:  1    Note new dose  . citalopram (CELEXA) 20 MG tablet    Sig: Take 1 tablet (20 mg total) by mouth daily.    Dispense:  90 tablet    Refill:  3  . traZODone (DESYREL) 150 MG tablet    Sig: TAKE  1/2 TABLET (75 MG TOTAL) BY MOUTH AT BEDTIME.    Dispense:  45 tablet    Refill:  3   Orders Placed This Encounter  Procedures  . Flu Vaccine QUAD 36+ mos IM    Patient instructions: Flu shot today  Trial pepcid 59m27m night time in addition to pantoprazole 59mg52mthe morning.  Vitamin b12 levels were borderline low - start b12 replacement 1000mcg73mly, dissolvable tablets if able to find.  Consider shingles shot (shingrix).  Consider covid vaccines.  Good to see you today. Return as needed or in 1 year for next physical. Hearing screen today.  Urinalysis today   Follow up plan: Return in about 1 year (around 03/19/2021) for annual exam, prior fasting for blood work.  Ria Bush, MD

## 2020-03-19 NOTE — Patient Instructions (Addendum)
Flu shot today  Trial pepcid 20mg  at night time in addition to pantoprazole 20mg  in the morning.  Vitamin b12 levels were borderline low - start b12 replacement daily, dissolvable tablets if able to find.  Consider shingles shot (shingrix).  Consider covid vaccines.  Good to see you today. Return as needed or in 1 year for next physical. Hearing screen today.  Urinalysis today   Health Maintenance, Male Adopting a healthy lifestyle and getting preventive care are important in promoting health and wellness. Ask your health care provider about:  The right schedule for you to have regular tests and exams.  Things you can do on your own to prevent diseases and keep yourself healthy. What should I know about diet, weight, and exercise? Eat a healthy diet   Eat a diet that includes plenty of vegetables, fruits, low-fat dairy products, and lean protein.  Do not eat a lot of foods that are high in solid fats, added sugars, or sodium. Maintain a healthy weight Body mass index (BMI) is a measurement that can be used to identify possible weight problems. It estimates body fat based on height and weight. Your health care provider can help determine your BMI and help you achieve or maintain a healthy weight. Get regular exercise Get regular exercise. This is one of the most important things you can do for your health. Most adults should:  Exercise for at least 150 minutes each week. The exercise should increase your heart rate and make you sweat (moderate-intensity exercise).  Do strengthening exercises at least twice a week. This is in addition to the moderate-intensity exercise.  Spend less time sitting. Even light physical activity can be beneficial. Watch cholesterol and blood lipids Have your blood tested for lipids and cholesterol at 50 years of age, then have this test every 5 years. You may need to have your cholesterol levels checked more often if:  Your lipid or cholesterol  levels are high.  You are older than 50 years of age.  You are at high risk for heart disease. What should I know about cancer screening? Many types of cancers can be detected early and may often be prevented. Depending on your health history and family history, you may need to have cancer screening at various ages. This may include screening for:  Colorectal cancer.  Prostate cancer.  Skin cancer.  Lung cancer. What should I know about heart disease, diabetes, and high blood pressure? Blood pressure and heart disease  High blood pressure causes heart disease and increases the risk of stroke. This is more likely to develop in people who have high blood pressure readings, are of African descent, or are overweight.  Talk with your health care provider about your target blood pressure readings.  Have your blood pressure checked: ? Every 3-5 years if you are 4-50 years of age. ? Every year if you are 46 years old or older.  If you are between the ages of 21 and 5 and are a current or former smoker, ask your health care provider if you should have a one-time screening for abdominal aortic aneurysm (AAA). Diabetes Have regular diabetes screenings. This checks your fasting blood sugar level. Have the screening done:  Once every three years after age 25 if you are at a normal weight and have a low risk for diabetes.  More often and at a younger age if you are overweight or have a high risk for diabetes. What should I know about preventing infection?  Hepatitis B If you have a higher risk for hepatitis B, you should be screened for this virus. Talk with your health care provider to find out if you are at risk for hepatitis B infection. Hepatitis C Blood testing is recommended for:  Everyone born from [birth year] through 1966.  Anyone with known risk factors for hepatitis C. Sexually transmitted infections (STIs)  You should be screened each year for STIs, including gonorrhea and  chlamydia, if: ? You are sexually active and are younger than 50 years of age. ? You are older than 50 years of age and your health care provider tells you that you are at risk for this type of infection. ? Your sexual activity has changed since you were last screened, and you are at increased risk for chlamydia or gonorrhea. Ask your health care provider if you are at risk.  Ask your health care provider about whether you are at high risk for HIV. Your health care provider may recommend a prescription medicine to help prevent HIV infection. If you choose to take medicine to prevent HIV, you should first get tested for HIV. You should then be tested every 3 months for as long as you are taking the medicine. Follow these instructions at home: Lifestyle  Do not use any products that contain nicotine or tobacco, such as cigarettes, e-cigarettes, and chewing tobacco. If you need help quitting, ask your health care provider.  Do not use street drugs.  Do not share needles.  Ask your health care provider for help if you need support or information about quitting drugs. Alcohol use  Do not drink alcohol if your health care provider tells you not to drink.  If you drink alcohol: ? Limit how much you have to 0-2 drinks a day. ? Be aware of how much alcohol is in your drink. In the [place] one drink equals one 12 oz bottle of beer (355 mL), one 5 oz glass of wine (148 mL), or one 1 oz glass of hard liquor (44 mL). General instructions  Schedule regular health, dental, and eye exams.  Stay current with your vaccines.  Tell your health care provider if: ? You often feel depressed. ? You have ever been abused or do not feel safe at home. Summary  Adopting a healthy lifestyle and getting preventive care are important in promoting health and wellness.  Follow your health care provider's instructions about healthy diet, exercising, and getting tested or screened for diseases.  Follow your health  care provider's instructions on monitoring your cholesterol and blood pressure. This information is not intended to replace advice given to you by your health care provider. Make sure you discuss any questions you have with your health care provider. Document Revised: 04/14/2018 Document Reviewed: 04/14/2018 Elsevier Patient Education  2020 [employer].

## 2020-03-19 NOTE — Assessment & Plan Note (Signed)
Appreciate endo care. Now on trulicity with improvement in glycemic control noted.

## 2020-03-19 NOTE — Assessment & Plan Note (Addendum)
Preventative protocols reviewed and updated unless pt declined. Discussed healthy diet and lifestyle.  Encouraged covid vaccine. Discussed shingrix vaccine.

## 2020-03-19 NOTE — Assessment & Plan Note (Signed)
Notes increasing anxiety/irritability.  Will increase buspar to 15mg  bid, monitoring for increased side effects such as dizziness, somnolence.

## 2020-03-19 NOTE — Assessment & Plan Note (Addendum)
Now on dupixent through derm with benefit.

## 2020-03-19 NOTE — Assessment & Plan Note (Signed)
Breakthrough symptoms on pantoprazole 20mg  - will add pepcid 20mg  at night time. Update if not controlled to increase PPI dose.

## 2020-03-19 NOTE — Assessment & Plan Note (Signed)
Chronic, stable on simvastatin. FLP reviewed from 01/2020. The 10-year ASCVD risk score Denman George DC Montez Hageman., et al., 2013) is: 4.2%   Values used to calculate the score:     Age: 49 years     Sex: Male     Is Non-Hispanic African American: No     Diabetic: Yes     Tobacco smoker: No     Systolic Blood Pressure: 118 mmHg     Is BP treated: No     HDL Cholesterol: 40.5 mg/dL     Total Cholesterol: 136 mg/dL

## 2020-03-19 NOTE — Assessment & Plan Note (Addendum)
Congratulated on smoking cessation.  Not eligible for lung cancer screen.  Check UA today.

## 2020-04-11 ENCOUNTER — Other Ambulatory Visit: Payer: Self-pay | Admitting: Family Medicine

## 2020-04-11 NOTE — Telephone Encounter (Signed)
Refill request Acyclovir Last office visit 03/19/20 Last refill 08/20/18 #30/3

## 2020-04-17 ENCOUNTER — Other Ambulatory Visit: Payer: Self-pay

## 2020-04-17 ENCOUNTER — Other Ambulatory Visit (INDEPENDENT_AMBULATORY_CARE_PROVIDER_SITE_OTHER): Payer: 59

## 2020-04-17 DIAGNOSIS — E1165 Type 2 diabetes mellitus with hyperglycemia: Secondary | ICD-10-CM | POA: Diagnosis not present

## 2020-04-17 LAB — BASIC METABOLIC PANEL WITH GFR
BUN: 17 mg/dL (ref 6–23)
CO2: 29 meq/L (ref 19–32)
Calcium: 9.3 mg/dL (ref 8.4–10.5)
Chloride: 104 meq/L (ref 96–112)
Creatinine, Ser: 1.15 mg/dL (ref 0.40–1.50)
GFR: 74.37 mL/min (ref >60.00)
Glucose, Bld: 170 mg/dL — ABNORMAL HIGH (ref 70–99)
Potassium: 4.4 meq/L (ref 3.5–5.1)
Sodium: 139 meq/L (ref 135–145)

## 2020-04-17 LAB — HEMOGLOBIN A1C: Hgb A1c MFr Bld: 7.4 % — ABNORMAL HIGH (ref 4.6–6.5)

## 2020-04-19 ENCOUNTER — Other Ambulatory Visit: Payer: 59

## 2020-04-23 ENCOUNTER — Encounter: Payer: Self-pay | Admitting: Endocrinology

## 2020-04-23 ENCOUNTER — Ambulatory Visit (INDEPENDENT_AMBULATORY_CARE_PROVIDER_SITE_OTHER): Payer: 59 | Admitting: Endocrinology

## 2020-04-23 ENCOUNTER — Other Ambulatory Visit: Payer: Self-pay

## 2020-04-23 VITALS — BP 128/72 | HR 60 | Ht 67.0 in | Wt 171.6 lb

## 2020-04-23 DIAGNOSIS — E1165 Type 2 diabetes mellitus with hyperglycemia: Secondary | ICD-10-CM | POA: Diagnosis not present

## 2020-04-23 NOTE — Progress Notes (Signed)
Patient ID: Andrew Hart, male   DOB: 12-Oct-1970, 49 y.o.   MRN: [id number]           Reason for Appointment: Follow-up for Type 2 Diabetes    History of Present Illness:          Date of diagnosis of type 2 diabetes mellitus: 2007 ?        Background history:  He was diagnosed with routine screening lab from his work He was initially tried on metformin but this caused diarrhea and he could not continue this Subsequently was treated with glipizide and apparently this was able to control his blood sugars fairly well for a few years Probably in early 2016 he was given Actos in addition to help his glucose control as A1c had gone up to 9.4 This helped transiently and A1c was 7.7 However his A1c had been otherwise over 8% since mid 2015 Because of  high blood sugars on his initial consultation in 05/2014 (A1c was 9.1) he was started on Invokana and Onglyza in addition to his Actos and glipizide.   Recent history:   Non-insulin hypoglycemic drugs: Trulicity 1.5 mg weekly, glipizide ER 10 mg daily, Actos 45 mg daily, Invokana 300 mg daily  His A1c in 6/21 was 9.1 and now back down to 7.4 Fructosamine recently 270  Current blood sugar patterns and problems identified:  He started having improved blood sugars with going up to 1.5 mg Trulicity  He says he checked his sugars a couple of times after the dose is increased but since he was feeling better he stopped checking his blood sugars  His lab glucose was 170 again but he does not remember what he had eaten breakfast that morning, usually eating a granola bar in the morning  He thinks his high blood sugar after eating is only 170 in the past  Fasting blood sugars at home are reportedly not over 130; he says that if he is missing meals blood sugar may go down to as low as 85  Weight has gone up a couple of pounds  He is generally active with his work and recently busy with remodeling the house  Renal function stable with  continuing Invokana  He does get reflux with Trulicity but takes Protonix in the morning now and Pepcid in the evenings with better control       Side effects from medications have been: Diarrhea from regular metformin, nausea and constipation from Rybelsus   Glucose monitoring:  Done once a day or less       Glucometer: Contour   Blood Glucose readings not available   Dietician visit, most recent: 06/2015  Weight history:   Wt Readings from Last 3 Encounters:  04/23/20 171 lb 9.6 oz (77.8 kg)  03/19/20 168 lb 2 oz (76.3 kg)  02/16/20 166 lb 9.6 oz (75.6 kg)    Glycemic control:    Lab Results  Component Value Date   HGBA1C 7.4 (H) 04/17/2020   HGBA1C 7.7 (H) 01/30/2020   HGBA1C 9.1 (H) 10/18/2019   Lab Results  Component Value Date   MICROALBUR <0.7 01/30/2020   LDLCALC 73 01/30/2020   CREATININE 1.15 04/17/2020    Lab on 04/17/2020  Component Date Value Ref Range Status  . Hgb A1c MFr Bld 04/17/2020 7.4* 4.6 - 6.5 % Final   Glycemic Control Guidelines for People with Diabetes:Non Diabetic:  <6%Goal of Therapy: <7%Additional Action Suggested:  >8%   . Sodium 04/17/2020 139  135 -  145 mEq/L Final  . Potassium 04/17/2020 4.4  3.5 - 5.1 mEq/L Final  . Chloride 04/17/2020 104  96 - 112 mEq/L Final  . CO2 04/17/2020 29  19 - 32 mEq/L Final  . Glucose, Bld 04/17/2020 170* 70 - 99 mg/dL Final  . BUN 60/82/3934 17  6 - 23 mg/dL Final  . Creatinine, Ser 04/17/2020 1.15  0.40 - 1.50 mg/dL Final  . GFR 91/31/3635 74.37  >60.00 mL/min Final   Calculated using the CKD-EPI Creatinine Equation (2021)  . Calcium 04/17/2020 9.3  8.4 - 10.5 mg/dL Final      Allergies as of 04/23/2020      Reactions   Janumet Xr [sitagliptin-metformin Hcl Er] Rash   Tolerates the plain Januvia    Jardiance [empagliflozin] Rash   Metformin And Related Diarrhea   Methotrexate Derivatives Other (See Comments)   transaminitis   Other Rash   Methyldibromo Gluteronitrile - skin product  preservative      Medication List       Accurate as of April 23, 2020 11:31 AM. If you have any questions, ask your nurse or doctor.        acyclovir 200 MG capsule Commonly known as: ZOVIRAX TAKE 1 CAPSULE BY MOUTH 3 TIMES DAILY FOR 3 TO 5 DAYS AS NEEDED.   aspirin EC 81 MG tablet Take 1 tablet (81 mg total) by mouth every Monday, Wednesday, and Friday.   B-12 1000 MCG Subl Place 1 tablet under the tongue daily.   Bayer Microlet Lancets lancets Use as instructed to check blood sugar twice a day dx E11.65   busPIRone 15 MG tablet Commonly known as: BUSPAR Take 1 tablet (15 mg total) by mouth 2 (two) times daily.   cholecalciferol 25 MCG (1000 UNIT) tablet Commonly known as: VITAMIN D3 Take 1,000 Units by mouth daily.   citalopram 20 MG tablet Commonly known as: CELEXA Take 1 tablet (20 mg total) by mouth daily.   Contour Next Monitor w/Device Kit CHECK BLOOD SUGAR TWICE DAILY   Contour Next Test test strip Generic drug: glucose blood USE TO CHECK BLOOD SUGARS TWICE DAILY   glucose blood test strip Check sugar once a daily   dupilumab 300 MG/2ML prefilled syringe Commonly known as: DUPIXENT Inject 300 mg into the skin every 14 (fourteen) days.   glipiZIDE 10 MG 24 hr tablet Commonly known as: GLUCOTROL XL TAKE 1 TABLET BY MOUTH DAILY WITH BREAKFAST   halobetasol 0.05 % cream Commonly known as: ULTRAVATE Apply 1 application topically 2 (two) times daily. Can cover with Saran wrap fo r30 min.  DO NOT apply to face/groin.   hydrOXYzine 25 MG tablet Commonly known as: ATARAX/VISTARIL Take 25 mg by mouth 3 (three) times daily as needed for itching.   ibuprofen 200 MG tablet Commonly known as: ADVIL Take 400 mg by mouth every 6 (six) hours as needed for headache or moderate pain.   Invokana 300 MG Tabs tablet Generic drug: canagliflozin TAKE 1 TABLET BY MOUTH ONCE DAILY   pantoprazole 20 MG tablet Commonly known as: PROTONIX TAKE 1 TABLET BY MOUTH  DAILY.   pioglitazone 45 MG tablet Commonly known as: ACTOS TAKE 1 TABLET BY MOUTH DAILY.   simvastatin 80 MG tablet Commonly known as: ZOCOR TAKE 1 TABLET BY MOUTH DAILY. What changed: when to take this   traZODone 150 MG tablet Commonly known as: DESYREL TAKE 1/2 TABLET (75 MG TOTAL) BY MOUTH AT BEDTIME.   Trulicity 1.5 MG/0.5ML Sopn Generic drug: Dulaglutide  INJECT 1.5 MG ONCE WEEKLY       Allergies:  Allergies  Allergen Reactions  . Janumet Xr [Sitagliptin-Metformin Hcl Er] Rash    Tolerates the plain Januvia   . Jardiance [Empagliflozin] Rash  . Metformin And Related Diarrhea  . Methotrexate Derivatives Other (See Comments)    transaminitis  . Other Rash    Methyldibromo Gluteronitrile - skin product preservative    Past Medical History:  Diagnosis Date  . Alcohol use 11/08/2015  . Anxiety   . Aortic aneurysm ([place])   . Diabetes mellitus without complication ([place]) 5537   Derika Eckles  . GERD (gastroesophageal reflux disease)   . Herpes labialis   . History of chicken pox   . Hyperlipidemia   . Kidney stone     Past Surgical History:  Procedure Laterality Date  . ABDOMINAL AORTOGRAM W/LOWER EXTREMITY N/A 07/22/2019   Procedure: ABDOMINAL AORTOGRAM W/LOWER EXTREMITY;  Surgeon: Elam Dutch, MD;  Location: [facility];  Service: Cardiovascular;  Laterality: N/A;  . APPENDECTOMY    . FINGER SURGERY Right    2 screws in ring finger  . PERIPHERAL VASCULAR INTERVENTION Left 07/22/2019   Procedure: PERIPHERAL VASCULAR INTERVENTION;  Surgeon: Elam Dutch, MD;  Location: [facility];  Service: Cardiovascular;  Laterality: Left;  . TONSILLECTOMY      Family History  Problem Relation Age of Onset  . Diabetes Mother   . Parkinsonism Mother 66       progressive supranuclear palsy, deceased  . CAD Father 70       MI at 4, CABG at 27  . Diabetes Maternal Grandmother   . Stroke Neg Hx   . Cancer Neg Hx   . Colon cancer Neg Hx   . Esophageal  cancer Neg Hx   . Rectal cancer Neg Hx   . Stomach cancer Neg Hx     Social History:  reports that he quit smoking about 14 months ago. His smoking use included cigarettes. He has a 7.50 pack-year smoking history. He has never used smokeless tobacco. He reports previous alcohol use. He reports previous drug use.    Review of Systems    Lipid history:  Has been on 80 mg simvastatin for treatment, prescribed by PCP  His lipids are consistently well controlled    Lab Results  Component Value Date   CHOL 136 01/30/2020   HDL 40.50 01/30/2020   LDLCALC 73 01/30/2020   TRIG 112.0 01/30/2020   CHOLHDL 3 01/30/2020         Lab Results  Component Value Date   ALT 19 01/30/2020    Most recent foot exam: 7/21 No numbness in his feet  He has been refusing to take the Covid vaccine despite explanation of benefits and safety Also refuses influenza vaccine   No history of hypertension   Review of Systems     Physical Examination:  BP 128/72   Pulse 60   Ht 5' 7" (1.702 m)   Wt 171 lb 9.6 oz (77.8 kg)   SpO2 96%   BMI 26.88 kg/m    ASSESSMENT:  Diabetes type 2, non-insulin-dependent  See history of present illness for detailed discussion of current diabetes management, blood sugar patterns and problems identified  His A1c is now 7.4 and slightly improved  Although his blood sugars are generally better with using 1.5 mg Trulicity in addition to his Invokana, Actos, and glipizide he is not monitoring at home   PLAN:  He will check blood sugars more regularly including after breakfast Encouraged him to add more protein to his breakfast and avoid high-fat and high carbohydrate meals and snacks To call if blood sugars are consistently high No change in medication but if his fasting readings are consistently high may change glipizide to Amaryl  There are no Patient Instructions on file for this visit.   Elayne Snare 04/23/2020, 11:31 AM   Note: This office  note was prepared with Dragon voice recognition system technology. Any transcriptional errors that result from this process are unintentional.

## 2020-04-23 NOTE — Patient Instructions (Signed)
Check blood sugars on waking up 2-3 days a week  Also check blood sugars about 2 hours after meals and do this after different meals by rotation  Recommended blood sugar levels on waking up are 90-130 and about 2 hours after meal is 130-160  Please bring your blood sugar monitor to each visit, thank you   

## 2020-05-01 ENCOUNTER — Other Ambulatory Visit: Payer: Self-pay | Admitting: Endocrinology

## 2020-05-01 ENCOUNTER — Other Ambulatory Visit: Payer: Self-pay | Admitting: Family Medicine

## 2020-05-01 DIAGNOSIS — E1165 Type 2 diabetes mellitus with hyperglycemia: Secondary | ICD-10-CM

## 2020-05-01 NOTE — Telephone Encounter (Signed)
Pharmacy requests refill on: Simvastatin 80 mg  LAST REFILL: 05/10/2019 (Q-90, R-3)  LAST OV: 03/19/2020 NEXT OV: 03/20/2021 PHARMACY: [facility] [place], [place]

## 2020-05-03 ENCOUNTER — Other Ambulatory Visit: Payer: Self-pay | Admitting: Family Medicine

## 2020-06-05 ENCOUNTER — Other Ambulatory Visit: Payer: Self-pay | Admitting: *Deleted

## 2020-06-05 MED ORDER — EMPAGLIFLOZIN 25 MG PO TABS: ORAL_TABLET | ORAL | 3 refills | Status: DC

## 2020-06-08 ENCOUNTER — Telehealth: Payer: Self-pay | Admitting: Endocrinology

## 2020-06-08 NOTE — Telephone Encounter (Signed)
Patient requests to be called at ph# [phone] re: Patient's medication was switched from Invokana to Farrell without Patient being made aware of change.

## 2020-06-11 ENCOUNTER — Other Ambulatory Visit: Payer: Self-pay | Admitting: *Deleted

## 2020-06-11 MED ORDER — CANAGLIFLOZIN 300 MG PO TABS: 300.0000 mg | ORAL_TABLET | Freq: Every day | ORAL | 4 refills | Status: DC

## 2020-06-11 NOTE — Telephone Encounter (Signed)
Patient called and advised that Invokana needs a Prior Authorization and that we can call #[phone] to do the PA  His call back # [phone]

## 2020-06-11 NOTE — Telephone Encounter (Signed)
I spoke with him this morning to let him know that his insurance denied Invokana and that's why jardiance was sent in instead. He said he has a copay card and that's what he's been using to get this medication with.

## 2020-06-11 NOTE — Telephone Encounter (Signed)
I spoke with Patient, he said he has an Invokana copay savings card and has been using that instead of billing insurance, he requested a new rx for Invokana to be sent to his pharmacy which was sent today.

## 2020-07-04 NOTE — Telephone Encounter (Signed)
Patient called to check status of PA for Invokana.  Not able to take Jardiance b/c of possible reaction.   Is requesting a call back to # [phone]

## 2020-07-05 ENCOUNTER — Other Ambulatory Visit: Payer: Self-pay | Admitting: Endocrinology

## 2020-07-05 NOTE — Telephone Encounter (Signed)
Marcelline Deist appears to be covered, he can take 10 mg daily.  He can get a co-pay card from the [email or web address] website.  Do not think he should take Januvia

## 2020-07-05 NOTE — Telephone Encounter (Signed)
Notified pt called pharmacy to re-sent the Rx to insurance came back needed PA. Called pharmacy [phone] --stated already had PA done which denied on 06/12/20. Pt requesting for Rx Januvia replacement the invokana.

## 2020-07-06 ENCOUNTER — Other Ambulatory Visit: Payer: Self-pay | Admitting: *Deleted

## 2020-07-06 DIAGNOSIS — E1165 Type 2 diabetes mellitus with hyperglycemia: Secondary | ICD-10-CM

## 2020-07-06 MED ORDER — DAPAGLIFLOZIN PROPANEDIOL 10 MG PO TABS: ORAL_TABLET | ORAL | 3 refills | Status: DC

## 2020-07-06 NOTE — Telephone Encounter (Signed)
Pt agreed to try Rx Farxiga 10 mg.

## 2020-07-06 NOTE — Telephone Encounter (Signed)
Sent Rx Farxiga 10 mg to --[facility].

## 2020-07-10 ENCOUNTER — Other Ambulatory Visit: Payer: Self-pay | Admitting: *Deleted

## 2020-08-02 ENCOUNTER — Telehealth: Payer: Self-pay | Admitting: Endocrinology

## 2020-08-02 ENCOUNTER — Other Ambulatory Visit: Payer: Self-pay | Admitting: Internal Medicine

## 2020-08-02 DIAGNOSIS — E1165 Type 2 diabetes mellitus with hyperglycemia: Secondary | ICD-10-CM

## 2020-08-02 MED ORDER — GLIPIZIDE ER 10 MG PO TB24: 20.0000 mg | ORAL_TABLET | Freq: Every day | ORAL | 1 refills | Status: DC

## 2020-08-02 NOTE — Telephone Encounter (Signed)
Pt did the right thing by stopping [place].   If his sugars are going up , I would suggest taking TWO tablet of Glipizide in the morning before Breakfast  ( if his sugars are trending higher then they have been )

## 2020-08-02 NOTE — Telephone Encounter (Signed)
Patient requests to be called at ph# [phone] re: Patient states he had a reaction to [place] (e.g. change in urine pattern, rash and pain in kidney's). Patient states he took [place] for 2 1/2 weeks and then stopped taking it (07/27/20) due the reactions.

## 2020-08-02 NOTE — Telephone Encounter (Signed)
Please advise 

## 2020-08-03 NOTE — Telephone Encounter (Signed)
Notified pt with Dr. Flora Lipps instructions. Pt understood without questions.

## 2020-08-21 ENCOUNTER — Other Ambulatory Visit: Payer: Self-pay

## 2020-08-21 ENCOUNTER — Other Ambulatory Visit (INDEPENDENT_AMBULATORY_CARE_PROVIDER_SITE_OTHER): Payer: 59

## 2020-08-21 DIAGNOSIS — E1165 Type 2 diabetes mellitus with hyperglycemia: Secondary | ICD-10-CM | POA: Diagnosis not present

## 2020-08-21 LAB — BASIC METABOLIC PANEL
BUN: 20 mg/dL (ref 6–23)
CO2: 31 mEq/L (ref 19–32)
Calcium: 9.3 mg/dL (ref 8.4–10.5)
Chloride: 103 mEq/L (ref 96–112)
Creatinine, Ser: 0.93 mg/dL (ref 0.40–1.50)
GFR: 95.72 mL/min (ref >60.00)
Glucose, Bld: 157 mg/dL — ABNORMAL HIGH (ref 70–99)
Potassium: 4.3 mEq/L (ref 3.5–5.1)
Sodium: 139 mEq/L (ref 135–145)

## 2020-08-21 LAB — HEMOGLOBIN A1C: Hgb A1c MFr Bld: 8.2 % — ABNORMAL HIGH (ref 4.6–6.5)

## 2020-08-23 ENCOUNTER — Ambulatory Visit: Payer: 59 | Admitting: Endocrinology

## 2020-08-24 ENCOUNTER — Encounter (HOSPITAL_COMMUNITY): Payer: 59

## 2020-08-24 ENCOUNTER — Ambulatory Visit: Payer: 59

## 2020-08-24 ENCOUNTER — Other Ambulatory Visit (HOSPITAL_COMMUNITY): Payer: 59

## 2020-08-27 ENCOUNTER — Other Ambulatory Visit: Payer: Self-pay

## 2020-08-27 ENCOUNTER — Ambulatory Visit (INDEPENDENT_AMBULATORY_CARE_PROVIDER_SITE_OTHER): Payer: 59 | Admitting: Endocrinology

## 2020-08-27 ENCOUNTER — Encounter: Payer: Self-pay | Admitting: Endocrinology

## 2020-08-27 VITALS — BP 124/74 | HR 76 | Ht 66.5 in | Wt 170.0 lb

## 2020-08-27 DIAGNOSIS — E1165 Type 2 diabetes mellitus with hyperglycemia: Secondary | ICD-10-CM | POA: Diagnosis not present

## 2020-08-27 NOTE — Patient Instructions (Signed)
Check blood sugars on waking up 3 days a week  Also check blood sugars about 2 hours after meals and do this after different meals by rotation  Recommended blood sugar levels on waking up are 90-130 and about 2 hours after meal is 130-160  Please bring your blood sugar monitor to each visit, thank you   

## 2020-08-27 NOTE — Progress Notes (Signed)
Patient ID: Andrew Hart, male   DOB: [birth year], 50 y.o.   MRN: [id number]           Reason for Appointment: Follow-up for Type 2 Diabetes    History of Present Illness:          Date of diagnosis of type 2 diabetes mellitus: 2007 ?        Background history:  He was diagnosed with routine screening lab from his work He was initially tried on metformin but this caused diarrhea and he could not continue this Subsequently was treated with glipizide and apparently this was able to control his blood sugars fairly well for a few years Probably in early 2016 he was given Actos in addition to help his glucose control as A1c had gone up to 9.4 This helped transiently and A1c was 7.7 However his A1c had been otherwise over 8% since mid 2015 Because of  high blood sugars on his initial consultation in 05/2014 (A1c was 9.1) he was started on Invokana and Onglyza in addition to his Actos and glipizide.   Recent history:   Non-insulin hypoglycemic drugs: Trulicity 1.5 mg weekly, glipizide ER 10 mg daily, Actos 45 mg daily  His A1c has fluctuated recently between 7.4 and 9.1, now 8.2 Fructosamine last 270  Current blood sugar patterns and problems identified:  His insurance did not cover his Invokana  He believes that he had kidney pain and rash with [place] and stopped it about a month ago  He also apparently had developed a rash along with Jardiance in 2018  Lab morning glucose 157  He does not like to take his sugars much and has only 3 glucose readings ranging from 156 up to 192 in the morning hours  More recently has had some more physical activities  Weight is about the same  He thinks he plans his meals and is eating healthy  He will get reflux symptoms with Trulicity but takes Protonix in the morning now and Pepcid in the evenings with better control       Side effects from medications have been: Diarrhea from regular metformin, nausea and constipation from  Rybelsus   Glucose monitoring:  Done once a day or less       Glucometer: Contour   Blood Glucose readings as above   Dietician visit, most recent: 06/2015  Weight history:   Wt Readings from Last 3 Encounters:  08/27/20 170 lb (77.1 kg)  04/23/20 171 lb 9.6 oz (77.8 kg)  03/19/20 168 lb 2 oz (76.3 kg)    Glycemic control:    Lab Results  Component Value Date   HGBA1C 8.2 (H) 08/21/2020   HGBA1C 7.4 (H) 04/17/2020   HGBA1C 7.7 (H) 01/30/2020   Lab Results  Component Value Date   MICROALBUR <0.7 01/30/2020   LDLCALC 73 01/30/2020   CREATININE 0.93 08/21/2020    Lab on 08/21/2020  Component Date Value Ref Range Status  . Sodium 08/21/2020 139  135 - 145 mEq/L Final  . Potassium 08/21/2020 4.3  3.5 - 5.1 mEq/L Final  . Chloride 08/21/2020 103  96 - 112 mEq/L Final  . CO2 08/21/2020 31  19 - 32 mEq/L Final  . Glucose, Bld 08/21/2020 157* 70 - 99 mg/dL Final  . BUN 08/21/2020 20  6 - 23 mg/dL Final  . Creatinine, Ser 08/21/2020 0.93  0.40 - 1.50 mg/dL Final  . GFR 08/21/2020 95.72  >60.00 mL/min Final   Calculated using the CKD-EPI  Creatinine Equation (2021)  . Calcium 08/21/2020 9.3  8.4 - 10.5 mg/dL Final  . Hgb A1c MFr Bld 08/21/2020 8.2* 4.6 - 6.5 % Final   Glycemic Control Guidelines for People with Diabetes:Non Diabetic:  <6%Goal of Therapy: <7%Additional Action Suggested:  >8%       Allergies as of 08/27/2020      Reactions   Janumet Xr [sitagliptin-metformin Hcl Er] Rash   Tolerates the plain Januvia    Jardiance [empagliflozin] Rash   Metformin And Related Diarrhea   Methotrexate Derivatives Other (See Comments)   transaminitis   Other Rash   Methyldibromo Gluteronitrile - skin product preservative      Medication List       Accurate as of August 27, 2020  2:30 PM. If you have any questions, ask your nurse or doctor.        acyclovir 200 MG capsule Commonly known as: ZOVIRAX TAKE 1 CAPSULE BY MOUTH 3 TIMES DAILY FOR 3 TO 5 DAYS AS NEEDED.    aspirin EC 81 MG tablet Take 1 tablet (81 mg total) by mouth every Monday, Wednesday, and Friday.   B-12 1000 MCG Subl Place 1 tablet under the tongue daily.   Bayer Microlet Lancets lancets Use as instructed to check blood sugar twice a day dx E11.65   busPIRone 15 MG tablet Commonly known as: BUSPAR Take 1 tablet (15 mg total) by mouth 2 (two) times daily.   canagliflozin 300 MG Tabs tablet Commonly known as: Invokana Take 1 tablet (300 mg total) by mouth daily.   cholecalciferol 25 MCG (1000 UNIT) tablet Commonly known as: VITAMIN D3 Take 1,000 Units by mouth daily.   citalopram 20 MG tablet Commonly known as: CELEXA Take 1 tablet (20 mg total) by mouth daily.   Contour Next Monitor w/Device Kit CHECK BLOOD SUGAR TWICE DAILY   Contour Next Test test strip Generic drug: glucose blood USE TO CHECK BLOOD SUGARS TWICE DAILY   glucose blood test strip Check sugar once a daily   dupilumab 300 MG/2ML prefilled syringe Commonly known as: DUPIXENT Inject 300 mg into the skin every 14 (fourteen) days.   empagliflozin 25 MG Tabs tablet Commonly known as: Jardiance Take 1 tablet by mouth daily  (TO REPLACE INVOKANA)   glipiZIDE 10 MG 24 hr tablet Commonly known as: GLUCOTROL XL Take 2 tablets (20 mg total) by mouth daily with breakfast.   halobetasol 0.05 % cream Commonly known as: ULTRAVATE Apply 1 application topically 2 (two) times daily. Can cover with Saran wrap fo r30 min.  DO NOT apply to face/groin.   hydrOXYzine 25 MG tablet Commonly known as: ATARAX/VISTARIL Take 25 mg by mouth 3 (three) times daily as needed for itching.   ibuprofen 200 MG tablet Commonly known as: ADVIL Take 400 mg by mouth every 6 (six) hours as needed for headache or moderate pain.   pantoprazole 20 MG tablet Commonly known as: PROTONIX TAKE 1 TABLET BY MOUTH DAILY.   pioglitazone 45 MG tablet Commonly known as: ACTOS TAKE 1 TABLET BY MOUTH DAILY.   simvastatin 80 MG  tablet Commonly known as: ZOCOR TAKE 1 TABLET BY MOUTH DAILY.   traZODone 150 MG tablet Commonly known as: DESYREL TAKE 1/2 TABLET (75 MG TOTAL) BY MOUTH AT BEDTIME.   Trulicity 1.5 VO/1.6WV Sopn Generic drug: Dulaglutide INJECT 1.5 MG ONCE WEEKLY       Allergies:  Allergies  Allergen Reactions  . Janumet Xr [Sitagliptin-Metformin Hcl Er] Rash    Tolerates the  plain Januvia   . Jardiance [Empagliflozin] Rash  . Metformin And Related Diarrhea  . Methotrexate Derivatives Other (See Comments)    transaminitis  . Other Rash    Methyldibromo Gluteronitrile - skin product preservative    Past Medical History:  Diagnosis Date  . Alcohol use 11/08/2015  . Anxiety   . Aortic aneurysm ([place])   . Diabetes mellitus without complication ([place]) 6761   Treshawn Allen  . GERD (gastroesophageal reflux disease)   . Herpes labialis   . History of chicken pox   . Hyperlipidemia   . Kidney stone     Past Surgical History:  Procedure Laterality Date  . ABDOMINAL AORTOGRAM W/LOWER EXTREMITY N/A 07/22/2019   Procedure: ABDOMINAL AORTOGRAM W/LOWER EXTREMITY;  Surgeon: Elam Dutch, MD;  Location: [facility];  Service: Cardiovascular;  Laterality: N/A;  . APPENDECTOMY    . FINGER SURGERY Right    2 screws in ring finger  . PERIPHERAL VASCULAR INTERVENTION Left 07/22/2019   Procedure: PERIPHERAL VASCULAR INTERVENTION;  Surgeon: Elam Dutch, MD;  Location: [facility];  Service: Cardiovascular;  Laterality: Left;  . TONSILLECTOMY      Family History  Problem Relation Age of Onset  . Diabetes Mother   . Parkinsonism Mother 65       progressive supranuclear palsy, deceased  . CAD Father 72       MI at 42, CABG at 19  . Diabetes Maternal Grandmother   . Stroke Neg Hx   . Cancer Neg Hx   . Colon cancer Neg Hx   . Esophageal cancer Neg Hx   . Rectal cancer Neg Hx   . Stomach cancer Neg Hx     Social History:  reports that he quit smoking about 18 months ago. His  smoking use included cigarettes. He has a 7.50 pack-year smoking history. He has never used smokeless tobacco. He reports previous alcohol use. He reports previous drug use.    Review of Systems    Lipid history:  Has been on 80 mg simvastatin for treatment, prescribed by PCP  His lipids are consistently well controlled    Lab Results  Component Value Date   CHOL 136 01/30/2020   HDL 40.50 01/30/2020   LDLCALC 73 01/30/2020   TRIG 112.0 01/30/2020   CHOLHDL 3 01/30/2020         Lab Results  Component Value Date   ALT 19 01/30/2020    Most recent foot exam: 7/21 No numbness in his feet  He has been refusing to take the Covid vaccine despite explanation of benefits and safety Also refuses influenza vaccine  He has been told to have atopic dermatitis by dermatologist  No history of hypertension   Review of Systems     Physical Examination:  BP 124/74   Pulse 76   Ht 5' 6.5" (1.689 m)   Wt 170 lb (77.1 kg)   SpO2 94%   BMI 27.03 kg/m    ASSESSMENT:  Diabetes type 2, non-insulin-dependent  See history of present illness for detailed discussion of current diabetes management, blood sugar patterns and problems identified  His A1c is now 8.2  He is on glipizide, Trulicity and Actos Although he has done better with SGLT2 drugs he is not able to get Invokana which has worked well for him and he did not think it was causing a rash Recently had stopped taking [place] as he thinks it caused his rash which is improving Also apparently had  rash with Jardiance previously Blood sugar monitoring minimal and likely has high readings after meals which he does not monitor    PLAN:    We will try to get Invokana prior authorized again To check blood sugars more consistently  There are no Patient Instructions on file for this visit.   Elayne Snare 08/27/2020, 2:30 PM   Note: This office note was prepared with Dragon voice recognition system technology. Any  transcriptional errors that result from this process are unintentional.

## 2020-08-28 ENCOUNTER — Ambulatory Visit (INDEPENDENT_AMBULATORY_CARE_PROVIDER_SITE_OTHER): Payer: 59 | Admitting: Physician Assistant

## 2020-08-28 ENCOUNTER — Other Ambulatory Visit: Payer: Self-pay | Admitting: *Deleted

## 2020-08-28 ENCOUNTER — Telehealth: Payer: Self-pay | Admitting: *Deleted

## 2020-08-28 ENCOUNTER — Ambulatory Visit (INDEPENDENT_AMBULATORY_CARE_PROVIDER_SITE_OTHER)
Admission: RE | Admit: 2020-08-28 | Discharge: 2020-08-28 | Disposition: A | Discharge status: Home / Self Care | Payer: 59 | Source: Ambulatory Visit | Attending: Vascular Surgery | Admitting: Vascular Surgery

## 2020-08-28 ENCOUNTER — Ambulatory Visit (HOSPITAL_COMMUNITY)
Admission: RE | Admit: 2020-08-28 | Discharge: 2020-08-28 | Disposition: A | Discharge status: Home / Self Care | Payer: 59 | Source: Ambulatory Visit | Attending: Vascular Surgery | Admitting: Vascular Surgery

## 2020-08-28 VITALS — BP 131/81 | HR 62 | Temp 97.6°F | Resp 20 | Ht 66.5 in | Wt 167.5 lb

## 2020-08-28 DIAGNOSIS — I739 Peripheral vascular disease, unspecified: Secondary | ICD-10-CM

## 2020-08-28 DIAGNOSIS — I714 Abdominal aortic aneurysm, without rupture, unspecified: Secondary | ICD-10-CM

## 2020-08-28 DIAGNOSIS — E1165 Type 2 diabetes mellitus with hyperglycemia: Secondary | ICD-10-CM

## 2020-08-28 MED ORDER — CANAGLIFLOZIN 300 MG PO TABS: 300.0000 mg | ORAL_TABLET | Freq: Every day | ORAL | 4 refills | Status: DC

## 2020-08-28 NOTE — Progress Notes (Signed)
HISTORY AND PHYSICAL     CC:  follow up. Requesting Provider:  Ria Bush, MD  HPI: This is a 50 y.o. male who is here today for follow up for PAD.  He underwent stenting of the left CIA with 9x39 VBX stent on 07/22/2019 by Dr. Oneida Alar.  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  Since he was not having any symptoms this was not treated.  He also has a known infrarenal abdominal aortic aneurysm about 3.5 cm diameter.  Pt was last seen 08/25/2019 and at that time, his claudication sx had resolved.  He was not having any back or abdominal pain.  He had also quit smoking.  He was on asa/statin.  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  He is to continue surveillance for AAA.  The pt returns today for follow up.  He states overall he is doing well.  He does not have any claudication sx.  He states several weeks ago, he started having some back pain.  He did go see a Restaurant manager, fast food and was adjusted and this helped a little bit but it is still present.  He does not have any pain radiating down his legs.    The pt is on a statin for cholesterol management.    The pt is on an aspirin.    Other AC:  none The pt is not on medication for hypertension.  The pt does have diabetes. Tobacco hx:  former  Pt does have family hx of AAA.  His father had an 8cm AAA that required an open repair and this is what prompted him to be evaluated for AAA.  Past Medical History:  Diagnosis Date  . Alcohol use 11/08/2015  . Anxiety   . Aortic aneurysm ([place])   . Diabetes mellitus without complication ([place]) 4696   Kumar  . GERD (gastroesophageal reflux disease)   . Herpes labialis   . History of chicken pox   . Hyperlipidemia   . Kidney stone     Past Surgical History:  Procedure Laterality Date  . ABDOMINAL AORTOGRAM W/LOWER EXTREMITY N/A 07/22/2019   Procedure: ABDOMINAL AORTOGRAM  W/LOWER EXTREMITY;  Surgeon: Elam Dutch, MD;  Location: [facility];  Service: Cardiovascular;  Laterality: N/A;  . APPENDECTOMY    . FINGER SURGERY Right    2 screws in ring finger  . PERIPHERAL VASCULAR INTERVENTION Left 07/22/2019   Procedure: PERIPHERAL VASCULAR INTERVENTION;  Surgeon: Elam Dutch, MD;  Location: [facility];  Service: Cardiovascular;  Laterality: Left;  . TONSILLECTOMY      Allergies  Allergen Reactions  . Janumet Xr [Sitagliptin-Metformin Hcl Er] Rash    Tolerates the plain Januvia   . Jardiance [Empagliflozin] Rash  . Metformin And Related Diarrhea  . Methotrexate Derivatives Other (See Comments)    transaminitis  . Other Rash    Methyldibromo Gluteronitrile - skin product preservative    Current Outpatient Medications  Medication Sig Dispense Refill  . acyclovir (ZOVIRAX) 200 MG capsule TAKE 1 CAPSULE BY MOUTH 3 TIMES DAILY FOR 3 TO 5 DAYS AS NEEDED. 30 capsule 3  . aspirin EC 81 MG tablet Take 1 tablet (81 mg total) by mouth every Monday, Wednesday, and Friday.    Marland Kitchen BAYER MICROLET LANCETS lancets Use as instructed to check blood sugar twice a day  dx E11.65 100 each 3  . Blood Glucose Monitoring Suppl (CONTOUR NEXT MONITOR) w/Device KIT CHECK BLOOD SUGAR TWICE DAILY 1 kit 0  . busPIRone (BUSPAR) 15 MG tablet Take 1 tablet (15 mg total) by mouth 2 (two) times daily. 180 tablet 1  . canagliflozin (INVOKANA) 300 MG TABS tablet Take 1 tablet (300 mg total) by mouth daily. (Patient not taking: Reported on 08/27/2020) 30 tablet 4  . cholecalciferol (VITAMIN D3) 25 MCG (1000 UNIT) tablet Take 1,000 Units by mouth daily.    . citalopram (CELEXA) 20 MG tablet Take 1 tablet (20 mg total) by mouth daily. 90 tablet 3  . CONTOUR NEXT TEST test strip USE TO CHECK BLOOD SUGARS TWICE DAILY 100 strip 3  . Cyanocobalamin (B-12) 1000 MCG SUBL Place 1 tablet under the tongue daily.    . dupilumab (DUPIXENT) 300 MG/2ML prefilled syringe Inject 300 mg into  the skin every 14 (fourteen) days.     Marland Kitchen glipiZIDE (GLUCOTROL XL) 10 MG 24 hr tablet Take 2 tablets (20 mg total) by mouth daily with breakfast. 180 tablet 1  . glucose blood test strip Check sugar once a daily 100 each 12  . halobetasol (ULTRAVATE) 0.05 % cream Apply 1 application topically 2 (two) times daily. Can cover with Saran wrap fo r30 min.  DO NOT apply to face/groin.    . hydrOXYzine (ATARAX/VISTARIL) 25 MG tablet Take 25 mg by mouth 3 (three) times daily as needed for itching.   0  . ibuprofen (ADVIL) 200 MG tablet Take 400 mg by mouth every 6 (six) hours as needed for headache or moderate pain.    . pantoprazole (PROTONIX) 20 MG tablet TAKE 1 TABLET BY MOUTH DAILY. 90 tablet 3  . pioglitazone (ACTOS) 45 MG tablet TAKE 1 TABLET BY MOUTH DAILY. 90 tablet 1  . simvastatin (ZOCOR) 80 MG tablet TAKE 1 TABLET BY MOUTH DAILY. 90 tablet 1  . traZODone (DESYREL) 150 MG tablet TAKE 1/2 TABLET (75 MG TOTAL) BY MOUTH AT BEDTIME. 45 tablet 3  . TRULICITY 1.5 WG/9.5AO SOPN INJECT 1.5 MG ONCE WEEKLY 2 mL 1   No current facility-administered medications for this visit.    Family History  Problem Relation Age of Onset  . Diabetes Mother   . Parkinsonism Mother 22       progressive supranuclear palsy, deceased  . CAD Father 61       MI at 10, CABG at 62  . Diabetes Maternal Grandmother   . Stroke Neg Hx   . Cancer Neg Hx   . Colon cancer Neg Hx   . Esophageal cancer Neg Hx   . Rectal cancer Neg Hx   . Stomach cancer Neg Hx     Social History   Socioeconomic History  . Marital status: Married    Spouse name: Not on file  . Number of children: Not on file  . Years of education: Not on file  . Highest education level: Not on file  Occupational History  . Not on file  Tobacco Use  . Smoking status: Former Smoker    Packs/day: 0.50    Years: 15.00    Pack years: 7.50    Types: Cigarettes    Quit date: 02/03/2019    Years since quitting: 1.5  . Smokeless tobacco: Never Used   Vaping Use  . Vaping Use: Never used  Substance and Sexual Activity  . Alcohol use: Not Currently    Alcohol/week: 0.0 standard drinks  Comment: Regular beer (12 pk/wkend)  . Drug use: Not Currently    Comment: MJ (occasional)  . Sexual activity: Not on file  Other Topics Concern  . Not on file  Social History Narrative   Lives with wife and 2 children (daughter 2003, son 12)    Occ: owns Education administrator business    Activity: active at work, at home on yard   Diet: some water, fruits/vegetables daily    Social Determinants of Radio broadcast assistant Strain: Not on Art therapist Insecurity: Not on file  Transportation Needs: Not on file  Physical Activity: Not on file  Stress: Not on file  Social Connections: Not on file  Intimate Partner Violence: Not on file     REVIEW OF SYSTEMS:   '[X]'$  denotes positive finding, $RemoveBeforeDEI'[ ]'[id number]$  denotes negative finding Cardiac  Comments:  Chest pain or chest pressure:    Shortness of breath upon exertion:    Short of breath when lying flat:    Irregular heart rhythm:        Vascular    Pain in calf, thigh, or hip brought on by ambulation:    Pain in feet at night that wakes you up from your sleep:     Blood clot in your veins:    Leg swelling:         Pulmonary    Oxygen at home:    Productive cough:     Wheezing:         Neurologic    Sudden weakness in arms or legs:     Sudden numbness in arms or legs:     Sudden onset of difficulty speaking or slurred speech:    Temporary loss of vision in one eye:     Problems with dizziness:         Gastrointestinal    Blood in stool:     Vomited blood:         Genitourinary    Burning when urinating:     Blood in urine:        Psychiatric    Major depression:         Hematologic    Bleeding problems:    Problems with blood clotting too easily:        Skin    Rashes or ulcers:        Constitutional    Fever or chills:      PHYSICAL EXAMINATION:  Today's Vitals   08/28/20 0859   BP: 131/81  Pulse: 62  Resp: 20  Temp: 97.6 F (36.4 C)  TempSrc: Temporal  SpO2: 99%  Weight: 167 lb 8 oz (76 kg)  Height: 5' 6.5" (1.689 m)  PainSc: 9   PainLoc: Back   Body mass index is 26.63 kg/m.   General:  WDWN in NAD; vital signs documented above Gait: Not observed HENT: WNL, normocephalic Pulmonary: normal non-labored breathing , without wheezing Cardiac: regular HR, without  Murmur; without carotid bruits Abdomen: soft, NT, no masses; aortic pulse is faintly palpable Skin: without rashes Vascular Exam/Pulses:  Right Left  Radial 2+ (normal) 2+ (normal)  Femoral 2+ (normal) 2+ (normal)  Popliteal Unable to palpate Unable to palpate  DP 2+ (normal) 2+ (normal)  PT Unable to palpate Unable to palpate   Extremities: without ischemic changes, without Gangrene , without cellulitis; without open wounds;  Musculoskeletal: no muscle wasting or atrophy  Neurologic: A&O X 3;  No focal weakness or paresthesias are detected Psychiatric:  The pt has Normal affect.   Non-Invasive Vascular Imaging:   ABI's/TBI's on 08/28/2020: Right:  0.88/0.68 - Great toe pressure: 91 Left:  1.04/0.97 - Great toe pressure: 130  AAA Arterial duplex on 08/28/2020: Abdominal Aorta: There is evidence of abnormal dilatation of the distal  Abdominal aorta. The largest aortic diameter remains essentially unchanged compared to prior exam. Previous diameter measurement was 3.2 cm obtained on 03/25/19 at [facility].   +------------------+-------------+-----------+  Location     Stenosis   Stent     +------------------+-------------+-----------+  Right Common Iliac>50% stenosis        +------------------+-------------+-----------+  Left Common Iliac        no stenosis  +------------------+-------------+-----------+   Previous ABI's/TBI's on 05/05/2019: Right:  1.00/0.64 - Great toe pressure: 79 Left:  0.75/0.54 - Great toe pressure:  67  Previous arterial  duplex on 03/25/2019: Abdominal Aorta:  There is evidence of abnormal dilatation of the distal Abdominal aorta.  The largest aortic measurement is 3.2 cm.  The largest aortic diameter remains essentially unchanged compared to prior exam.  Previous diameter measurement was 3.6 cm obtained on 08/07/17.  Atherosclerosis in the aorta and iliac arteries. >50% stenosis in the bilateral common iliac arteries; left worse than  right.    ASSESSMENT/PLAN:: 50 y.o. male here for follow up for stenting of the left CIA with 9x39 VBX stent on 07/22/2019 by Dr. Oneida Alar.  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  Since he was not having any symptoms this was not treated.  He also has a known infrarenal abdominal aortic aneurysm about 3.5 cm diameter.   PAD -pt doing well and has palpable pedal pulses.  He is currently asymptomatic without claudication.  His right CIA stenosis is essentially unchanged from previous study.  He knows that if he develops claudication like before, we will see him back and evaluate for RCIA stenting.  AAA -pt AAA essentially unchanged from previous study at 3.3cm. -he has had some back pain recently, but this is not vascular in nature.  We did discuss that there is a minimal chance of rupture at the size that it is currently but if he experienced sudden severe abdominal or back pain, that is a call to 911.  He expressed understanding.  He does have two teenage children and I discussed with him given his and his father's hx of AAA, they would need to be screened later in life.     -pt will f/u in one year with ABI and aortoiliac duplex.  He will call sooner if he has any issues.  -continue statin/asa   Leontine Locket, [facility] [phone]  Clinic MD:   Carlis Abbott

## 2020-08-28 NOTE — Telephone Encounter (Signed)
Andrew Hart said she had already sent the script and he knows

## 2020-08-28 NOTE — Telephone Encounter (Signed)
Is patient aware?

## 2020-09-14 ENCOUNTER — Other Ambulatory Visit: Payer: Self-pay | Admitting: Endocrinology

## 2020-09-23 ENCOUNTER — Encounter: Payer: Self-pay | Admitting: Family Medicine

## 2020-09-23 DIAGNOSIS — Z95828 Presence of other vascular implants and grafts: Secondary | ICD-10-CM | POA: Insufficient documentation

## 2020-09-26 ENCOUNTER — Other Ambulatory Visit: Payer: Self-pay | Admitting: Family Medicine

## 2020-10-29 ENCOUNTER — Other Ambulatory Visit: Payer: Self-pay

## 2020-10-29 ENCOUNTER — Other Ambulatory Visit (INDEPENDENT_AMBULATORY_CARE_PROVIDER_SITE_OTHER): Payer: 59

## 2020-10-29 DIAGNOSIS — E1165 Type 2 diabetes mellitus with hyperglycemia: Secondary | ICD-10-CM | POA: Diagnosis not present

## 2020-10-29 LAB — BASIC METABOLIC PANEL
BUN: 15 mg/dL (ref 6–23)
CO2: 28 mEq/L (ref 19–32)
Calcium: 9.4 mg/dL (ref 8.4–10.5)
Chloride: 103 mEq/L (ref 96–112)
Creatinine, Ser: 1.09 mg/dL (ref 0.40–1.50)
GFR: 79.01 mL/min (ref >60.00)
Glucose, Bld: 144 mg/dL — ABNORMAL HIGH (ref 70–99)
Potassium: 4.1 mEq/L (ref 3.5–5.1)
Sodium: 138 mEq/L (ref 135–145)

## 2020-10-30 LAB — FRUCTOSAMINE: Fructosamine: 301 umol/L — ABNORMAL HIGH (ref 0–285)

## 2020-11-01 ENCOUNTER — Other Ambulatory Visit: Payer: Self-pay

## 2020-11-01 ENCOUNTER — Encounter: Payer: Self-pay | Admitting: Endocrinology

## 2020-11-01 ENCOUNTER — Ambulatory Visit (INDEPENDENT_AMBULATORY_CARE_PROVIDER_SITE_OTHER): Payer: 59 | Admitting: Endocrinology

## 2020-11-01 VITALS — BP 138/72 | HR 76 | Ht 68.0 in | Wt 164.8 lb

## 2020-11-01 DIAGNOSIS — E785 Hyperlipidemia, unspecified: Secondary | ICD-10-CM

## 2020-11-01 DIAGNOSIS — E1165 Type 2 diabetes mellitus with hyperglycemia: Secondary | ICD-10-CM

## 2020-11-01 DIAGNOSIS — E1169 Type 2 diabetes mellitus with other specified complication: Secondary | ICD-10-CM

## 2020-11-01 NOTE — Progress Notes (Signed)
Patient ID: Andrew Hart, male   DOB: 09/19/1970, 50 y.o.   MRN: [id number]           Reason for Appointment: Follow-up for Type 2 Diabetes    History of Present Illness:          Date of diagnosis of type 2 diabetes mellitus: 2007 ?        Background history:  He was diagnosed with routine screening lab from his work He was initially tried on metformin but this caused diarrhea and he could not continue this Subsequently was treated with glipizide and apparently this was able to control his blood sugars fairly well for a few years Probably in early 2016 he was given Actos in addition to help his glucose control as A1c had gone up to 9.4 This helped transiently and A1c was 7.7 However his A1c had been otherwise over 8% since mid 2015 Because of  high blood sugars on his initial consultation in 05/2014 (A1c was 9.1) he was started on Invokana and Onglyza in addition to his Actos and glipizide.   Recent history:   Non-insulin hypoglycemic drugs: Trulicity 1.5 mg weekly, glipizide ER 20 mg daily, Actos 45 mg daily  His A1c has fluctuated recently between 7.4 and 9.1, last 8.2  Fructosamine 301, previously 270  Current blood sugar patterns and problems identified: His insurance did cover his Invokana after prior authorization in late April He did not want to take [place] and Jardiance because of possible rash He did not bring his monitor for download Lab morning glucose was 144 fasting He thinks his blood sugars even with taking Invokana have been relatively higher about 130 or so fasting and about 190 after meals Previously had only minimal monitoring About 4 to 5 days ago he says he doubled his glipizide on his own because his sugars were high Not like to take his sugars much and has only 3 glucose readings ranging from 156 up to 192 in the morning hours More recently has had readings as low as 119 in the morning but not clear if his readings after meals are better No  hypoglycemic symptoms He is getting more active with work during the day Weight has gone down 3 pounds  He tends to have GI reflux symptoms with Trulicity but takes Protonix in the morning now and Pepcid in the evenings as needed Renal function normal       Side effects from medications have been: Diarrhea from regular metformin, nausea and constipation from Rybelsus   Glucose monitoring:  Done once a day or less       Glucometer: Contour   Blood Glucose readings by recall as above   Dietician visit, most recent: 06/2015  Weight history:   Wt Readings from Last 3 Encounters:  11/01/20 164 lb 12.8 oz (74.8 kg)  08/28/20 167 lb 8 oz (76 kg)  08/27/20 170 lb (77.1 kg)    Glycemic control:    Lab Results  Component Value Date   HGBA1C 8.2 (H) 08/21/2020   HGBA1C 7.4 (H) 04/17/2020   HGBA1C 7.7 (H) 01/30/2020   Lab Results  Component Value Date   MICROALBUR <0.7 01/30/2020   LDLCALC 73 01/30/2020   CREATININE 1.09 10/29/2020    Lab on 10/29/2020  Component Date Value Ref Range Status   Fructosamine 10/29/2020 301 (A) 0 - 285 umol/L Final   Comment: Published reference interval for apparently healthy subjects between age 25 and 43 is 53 - 285 umol/L  and in a poorly controlled diabetic population is 228 - 563 umol/L with a mean of 396 umol/L.    Sodium 10/29/2020 138  135 - 145 mEq/L Final   Potassium 10/29/2020 4.1  3.5 - 5.1 mEq/L Final   Chloride 10/29/2020 103  96 - 112 mEq/L Final   CO2 10/29/2020 28  19 - 32 mEq/L Final   Glucose, Bld 10/29/2020 144 (A) 70 - 99 mg/dL Final   BUN 10/29/2020 15  6 - 23 mg/dL Final   Creatinine, Ser 10/29/2020 1.09  0.40 - 1.50 mg/dL Final   GFR 10/29/2020 79.01  >60.00 mL/min Final   Calculated using the CKD-EPI Creatinine Equation (2021)   Calcium 10/29/2020 9.4  8.4 - 10.5 mg/dL Final      Allergies as of 11/01/2020       Reactions   Janumet Xr [sitagliptin-metformin Hcl Er] Rash   Tolerates the plain Januvia     Jardiance [empagliflozin] Rash   Metformin And Related Diarrhea   Methotrexate Derivatives Other (See Comments)   transaminitis   Other Rash   Methyldibromo Gluteronitrile - skin product preservative        Medication List        Accurate as of November 01, 2020  1:45 PM. If you have any questions, ask your nurse or doctor.          acyclovir 200 MG capsule Commonly known as: ZOVIRAX TAKE 1 CAPSULE BY MOUTH 3 TIMES DAILY FOR 3 TO 5 DAYS AS NEEDED.   aspirin EC 81 MG tablet Take 1 tablet (81 mg total) by mouth every Monday, Wednesday, and Friday.   B-12 1000 MCG Subl Place 1 tablet under the tongue daily.   Bayer Microlet Lancets lancets Use as instructed to check blood sugar twice a day dx E11.65   busPIRone 15 MG tablet Commonly known as: BUSPAR TAKE 1 TABLET BY MOUTH 2 TIMES DAILY.   canagliflozin 300 MG Tabs tablet Commonly known as: Invokana Take 1 tablet (300 mg total) by mouth daily.   cholecalciferol 25 MCG (1000 UNIT) tablet Commonly known as: VITAMIN D3 Take 1,000 Units by mouth daily.   citalopram 20 MG tablet Commonly known as: CELEXA Take 1 tablet (20 mg total) by mouth daily.   Contour Next Monitor w/Device Kit CHECK BLOOD SUGAR TWICE DAILY   Contour Next Test test strip Generic drug: glucose blood USE TO CHECK BLOOD SUGARS TWICE DAILY   glucose blood test strip Check sugar once a daily   dupilumab 300 MG/2ML prefilled syringe Commonly known as: DUPIXENT Inject 300 mg into the skin every 14 (fourteen) days.   glipiZIDE 10 MG 24 hr tablet Commonly known as: GLUCOTROL XL Take 2 tablets (20 mg total) by mouth daily with breakfast.   halobetasol 0.05 % cream Commonly known as: ULTRAVATE Apply 1 application topically 2 (two) times daily. Can cover with Saran wrap fo r30 min.  DO NOT apply to face/groin.   hydrOXYzine 25 MG tablet Commonly known as: ATARAX/VISTARIL Take 25 mg by mouth 3 (three) times daily as needed for itching.    ibuprofen 200 MG tablet Commonly known as: ADVIL Take 400 mg by mouth every 6 (six) hours as needed for headache or moderate pain.   pantoprazole 20 MG tablet Commonly known as: PROTONIX TAKE 1 TABLET BY MOUTH DAILY.   pioglitazone 45 MG tablet Commonly known as: ACTOS TAKE 1 TABLET BY MOUTH DAILY.   simvastatin 80 MG tablet Commonly known as: ZOCOR TAKE 1 TABLET BY  MOUTH DAILY.   traZODone 150 MG tablet Commonly known as: DESYREL TAKE 1/2 TABLET (75 MG TOTAL) BY MOUTH AT BEDTIME.   Trulicity 1.5 PZ/0.2HE Sopn Generic drug: Dulaglutide INJECT 1.5 MG ONCE WEEKLY        Allergies:  Allergies  Allergen Reactions   Janumet Xr [Sitagliptin-Metformin Hcl Er] Rash    Tolerates the plain Januvia    Jardiance [Empagliflozin] Rash   Metformin And Related Diarrhea   Methotrexate Derivatives Other (See Comments)    transaminitis   Other Rash    Methyldibromo Gluteronitrile - skin product preservative    Past Medical History:  Diagnosis Date   Alcohol use 11/08/2015   Anxiety    Aortic aneurysm ([facility])    Diabetes mellitus without complication ([place]) 5277   Sahra Converse   GERD (gastroesophageal reflux disease)    Herpes labialis    History of chicken pox    Hyperlipidemia    Kidney stone     Past Surgical History:  Procedure Laterality Date   ABDOMINAL AORTOGRAM W/LOWER EXTREMITY N/A 07/22/2019   Procedure: ABDOMINAL AORTOGRAM W/LOWER EXTREMITY;  Surgeon: Elam Dutch, MD;  Location: [place];  Service: Cardiovascular;  Laterality: N/A;   APPENDECTOMY     FINGER SURGERY Right    2 screws in ring finger   PERIPHERAL VASCULAR INTERVENTION Left 07/22/2019   Procedure: PERIPHERAL VASCULAR INTERVENTION;  Surgeon: Elam Dutch, MD;  Location: [place] [facility];  Service: Cardiovascular;  Laterality: Left;   TONSILLECTOMY      Family History  Problem Relation Age of Onset   Diabetes Mother    Parkinsonism Mother 74       progressive supranuclear palsy,  deceased   CAD Father 9       MI at 42, CABG at 29   Diabetes Maternal Grandmother    Stroke Neg Hx    Cancer Neg Hx    Colon cancer Neg Hx    Esophageal cancer Neg Hx    Rectal cancer Neg Hx    Stomach cancer Neg Hx     Social History:  reports that he quit smoking about 20 months ago. His smoking use included cigarettes. He has a 7.50 pack-year smoking history. He has never used smokeless tobacco. He reports previous alcohol use. He reports previous drug use.    Review of Systems    Lipid history:  Has been on 80 mg simvastatin for treatment, prescribed by PCP  His lipids are generally well controlled    Lab Results  Component Value Date   CHOL 136 01/30/2020   HDL 40.50 01/30/2020   LDLCALC 73 01/30/2020   TRIG 112.0 01/30/2020   CHOLHDL 3 01/30/2020         Lab Results  Component Value Date   ALT 19 01/30/2020    Most recent foot exam: 7/21 No numbness in his feet  He has been refusing to take the Covid vaccine despite explanation of benefits and safety Also refuses influenza vaccine  He has been told to have atopic dermatitis by dermatologist and is on treatment  No history of hypertension, blood pressure is high normal now  BP Readings from Last 3 Encounters:  11/01/20 138/72  08/28/20 131/81  08/27/20 124/74      Review of Systems     Physical Examination:  BP 138/72   Pulse 76   Ht '5\' 8"'  (1.727 m)   Wt 164 lb 12.8 oz (74.8 kg)   SpO2 98%   BMI  25.06 kg/m    ASSESSMENT:  Diabetes type 2, non-insulin-dependent  See history of present illness for detailed discussion of current diabetes management, blood sugar patterns and problems identified  His A1c is last 8.2  He is on glipizide ER 20 mg, INVOKANA 517 and Trulicity and Actos Despite all 4 medications he reportedly had higher readings at times and is now taking 20 mg glipizide Unable to review his home monitor but he thinks his blood sugars are relatively better recently without  hypoglycemia He is generally watching his diet although today he had a regular soft drink at lunch Also generally active Lost 3 pounds with Invokana as expected   PLAN:    No change in medications Currently unable to verify his blood sugars and discussed what his blood sugar should be before and after meals May consider Amaryl if he has uneven control with glipizide  There are no Patient Instructions on file for this visit.   Elayne Snare 11/01/2020, 1:45 PM   Note: This office note was prepared with Dragon voice recognition system technology. Any transcriptional errors that result from this process are unintentional.

## 2020-11-06 ENCOUNTER — Other Ambulatory Visit: Payer: Self-pay | Admitting: Family Medicine

## 2020-11-16 ENCOUNTER — Other Ambulatory Visit: Payer: Self-pay | Admitting: Endocrinology

## 2021-01-15 ENCOUNTER — Other Ambulatory Visit: Payer: Self-pay | Admitting: Endocrinology

## 2021-01-17 ENCOUNTER — Other Ambulatory Visit: Payer: Self-pay

## 2021-01-17 ENCOUNTER — Other Ambulatory Visit (INDEPENDENT_AMBULATORY_CARE_PROVIDER_SITE_OTHER): Payer: 59

## 2021-01-17 DIAGNOSIS — E1169 Type 2 diabetes mellitus with other specified complication: Secondary | ICD-10-CM

## 2021-01-17 DIAGNOSIS — E1165 Type 2 diabetes mellitus with hyperglycemia: Secondary | ICD-10-CM

## 2021-01-17 DIAGNOSIS — E785 Hyperlipidemia, unspecified: Secondary | ICD-10-CM

## 2021-01-17 LAB — MICROALBUMIN / CREATININE URINE RATIO
Creatinine,U: 136.6 mg/dL
Microalb Creat Ratio: 0.5 mg/g (ref 0.0–30.0)
Microalb, Ur: <0.7 mg/dL (ref 0.0–1.9)

## 2021-01-17 LAB — COMPREHENSIVE METABOLIC PANEL
ALT: 20 U/L (ref 0–53)
AST: 20 U/L (ref 0–37)
Albumin: 4.3 g/dL (ref 3.5–5.2)
Alkaline Phosphatase: 77 U/L (ref 39–117)
BUN: 20 mg/dL (ref 6–23)
CO2: 29 mEq/L (ref 19–32)
Calcium: 9.3 mg/dL (ref 8.4–10.5)
Chloride: 104 mEq/L (ref 96–112)
Creatinine, Ser: 1.06 mg/dL (ref 0.40–1.50)
GFR: 81.58 mL/min (ref >60.00)
Glucose, Bld: 102 mg/dL — ABNORMAL HIGH (ref 70–99)
Potassium: 3.8 mEq/L (ref 3.5–5.1)
Sodium: 139 mEq/L (ref 135–145)
Total Bilirubin: 0.9 mg/dL (ref 0.2–1.2)
Total Protein: 6.8 g/dL (ref 6.0–8.3)

## 2021-01-17 LAB — LIPID PANEL
Cholesterol: 131 mg/dL (ref 0–200)
HDL: 43.3 mg/dL (ref >39.00)
LDL Cholesterol: 71 mg/dL (ref 0–99)
NonHDL: 87.51
Total CHOL/HDL Ratio: 3
Triglycerides: 81 mg/dL (ref 0.0–149.0)
VLDL: 16.2 mg/dL (ref 0.0–40.0)

## 2021-01-17 LAB — HEMOGLOBIN A1C: Hgb A1c MFr Bld: 7.4 % — ABNORMAL HIGH (ref 4.6–6.5)

## 2021-01-21 ENCOUNTER — Other Ambulatory Visit: Payer: 59

## 2021-01-24 ENCOUNTER — Other Ambulatory Visit: Payer: Self-pay

## 2021-01-24 ENCOUNTER — Encounter: Payer: Self-pay | Admitting: Endocrinology

## 2021-01-24 ENCOUNTER — Ambulatory Visit (INDEPENDENT_AMBULATORY_CARE_PROVIDER_SITE_OTHER): Payer: 59 | Admitting: Endocrinology

## 2021-01-24 VITALS — BP 138/82 | HR 93 | Ht 67.0 in | Wt 167.8 lb

## 2021-01-24 DIAGNOSIS — E1165 Type 2 diabetes mellitus with hyperglycemia: Secondary | ICD-10-CM

## 2021-01-24 NOTE — Progress Notes (Signed)
Patient ID: Andrew Hart, male   DOB: [birth year], 50 y.o.   MRN: [id number]           Reason for Appointment: Follow-up for Type 2 Diabetes    History of Present Illness:          Date of diagnosis of type 2 diabetes mellitus: 2007 ?        Background history:  He was diagnosed with routine screening lab from his work He was initially tried on metformin but this caused diarrhea and he could not continue this Subsequently was treated with glipizide and apparently this was able to control his blood sugars fairly well for a few years Probably in early 2016 he was given Actos in addition to help his glucose control as A1c had gone up to 9.4 This helped transiently and A1c was 7.7 However his A1c had been otherwise over 8% since mid 2015 Because of  high blood sugars on his initial consultation in 05/2014 (A1c was 9.1) he was started on Invokana and Onglyza in addition to his Actos and glipizide.   Recent history:   Non-insulin hypoglycemic drugs: Trulicity 1.5 mg weekly, glipizide ER 20 mg daily, Actos 45 mg daily, Invokana 300 mg daily  His A1c has fluctuated recently between 7.4 and 9.1,  Fructosamine 301, previously 270  Current blood sugar patterns and problems identified: He has been taking all his medications as directed He did not bring his monitor for download Lab morning glucose was 102 fasting He does not like to check his blood sugars and not clear what they are especially after meals  He was concerned about potential weight loss from Invokana but has gained 3 pounds No side effects with this If he is late for lunch he will feel little hypoglycemic and will drink some coke  Otherwise may not always restrict his regular soft drinks also  Although overall he thinks he may be getting fairly healthy diet except at times eating fast food during the day  Usually fairly active at work   He tends to have GI reflux symptoms with Trulicity but takes Protonix in the morning and  Pepcid in the evenings as needed       Side effects from medications have been: Diarrhea from regular metformin, nausea and constipation from Rybelsus   Glucose monitoring:  Done once a day or less       Glucometer: Contour   Blood Glucose readings not available   Dietician visit, most recent: 06/2015  Weight history:   Wt Readings from Last 3 Encounters:  01/24/21 167 lb 12.8 oz (76.1 kg)  11/01/20 164 lb 12.8 oz (74.8 kg)  08/28/20 167 lb 8 oz (76 kg)    Glycemic control:    Lab Results  Component Value Date   HGBA1C 7.4 (H) 01/17/2021   HGBA1C 8.2 (H) 08/21/2020   HGBA1C 7.4 (H) 04/17/2020   Lab Results  Component Value Date   MICROALBUR <0.7 01/17/2021   LDLCALC 71 01/17/2021   CREATININE 1.06 01/17/2021    No visits with results within 1 Week(s) from this visit.  Latest known visit with results is:  Lab on 01/17/2021  Component Date Value Ref Range Status   Microalb, Ur 01/17/2021 <0.7  0.0 - 1.9 mg/dL Final   Creatinine,U 01/17/2021 136.6  mg/dL Final   Microalb Creat Ratio 01/17/2021 0.5  0.0 - 30.0 mg/g Final   Cholesterol 01/17/2021 131  0 - 200 mg/dL Final   ATP III Classification  Desirable:  < 200 mg/dL               Borderline High:  200 - 239 mg/dL          High:  > = 240 mg/dL   Triglycerides 01/17/2021 81.0  0.0 - 149.0 mg/dL Final   Normal:  <150 mg/dLBorderline High:  150 - 199 mg/dL   HDL 01/17/2021 43.30  >39.00 mg/dL Final   VLDL 01/17/2021 16.2  0.0 - 40.0 mg/dL Final   LDL Cholesterol 01/17/2021 71  0 - 99 mg/dL Final   Total CHOL/HDL Ratio 01/17/2021 3   Final                  Men          Women1/2 Average Risk     3.4          3.3Average Risk          5.0          4.42X Average Risk          9.6          7.13X Average Risk          15.0          11.0                       NonHDL 01/17/2021 87.51   Final   NOTE:  Non-HDL goal should be 30 mg/dL higher than patient's LDL goal (i.e. LDL goal of < 70 mg/dL, would have non-HDL goal of < 100  mg/dL)   Sodium 01/17/2021 139  135 - 145 mEq/L Final   Potassium 01/17/2021 3.8  3.5 - 5.1 mEq/L Final   Chloride 01/17/2021 104  96 - 112 mEq/L Final   CO2 01/17/2021 29  19 - 32 mEq/L Final   Glucose, Bld 01/17/2021 102 (A) 70 - 99 mg/dL Final   BUN 01/17/2021 20  6 - 23 mg/dL Final   Creatinine, Ser 01/17/2021 1.06  0.40 - 1.50 mg/dL Final   Total Bilirubin 01/17/2021 0.9  0.2 - 1.2 mg/dL Final   Alkaline Phosphatase 01/17/2021 77  39 - 117 U/L Final   AST 01/17/2021 20  0 - 37 U/L Final   ALT 01/17/2021 20  0 - 53 U/L Final   Total Protein 01/17/2021 6.8  6.0 - 8.3 g/dL Final   Albumin 01/17/2021 4.3  3.5 - 5.2 g/dL Final   GFR 01/17/2021 81.58  >60.00 mL/min Final   Calculated using the CKD-EPI Creatinine Equation (2021)   Calcium 01/17/2021 9.3  8.4 - 10.5 mg/dL Final   Hgb A1c MFr Bld 01/17/2021 7.4 (A) 4.6 - 6.5 % Final   Glycemic Control Guidelines for People with Diabetes:Non Diabetic:  <6%Goal of Therapy: <7%Additional Action Suggested:  >8%       Allergies as of 01/24/2021       Reactions   Janumet Xr [sitagliptin-metformin Hcl Er] Rash   Tolerates the plain Januvia    Jardiance [empagliflozin] Rash   Metformin And Related Diarrhea   Methotrexate Derivatives Other (See Comments)   transaminitis   Other Rash   Methyldibromo Gluteronitrile - skin product preservative        Medication List        Accurate as of January 24, 2021  4:55 PM. If you have any questions, ask your nurse or doctor.          acyclovir 200 MG capsule Commonly known  as: ZOVIRAX TAKE 1 CAPSULE BY MOUTH 3 TIMES DAILY FOR 3 TO 5 DAYS AS NEEDED.   aspirin EC 81 MG tablet Take 1 tablet (81 mg total) by mouth every Monday, Wednesday, and Friday.   B-12 1000 MCG Subl Place 1 tablet under the tongue daily.   Bayer Microlet Lancets lancets Use as instructed to check blood sugar twice a day dx E11.65   busPIRone 15 MG tablet Commonly known as: BUSPAR TAKE 1 TABLET BY MOUTH 2 TIMES  DAILY.   canagliflozin 300 MG Tabs tablet Commonly known as: Invokana Take 1 tablet (300 mg total) by mouth daily.   cholecalciferol 25 MCG (1000 UNIT) tablet Commonly known as: VITAMIN D3 Take 1,000 Units by mouth daily.   citalopram 20 MG tablet Commonly known as: CELEXA Take 1 tablet (20 mg total) by mouth daily.   Contour Next Monitor w/Device Kit CHECK BLOOD SUGAR TWICE DAILY   Contour Next Test test strip Generic drug: glucose blood USE TO CHECK BLOOD SUGARS TWICE DAILY   glucose blood test strip Check sugar once a daily   dupilumab 300 MG/2ML prefilled syringe Commonly known as: DUPIXENT Inject 300 mg into the skin every 14 (fourteen) days.   glipiZIDE 10 MG 24 hr tablet Commonly known as: GLUCOTROL XL Take 2 tablets (20 mg total) by mouth daily with breakfast.   halobetasol 0.05 % cream Commonly known as: ULTRAVATE Apply 1 application topically 2 (two) times daily. Can cover with Saran wrap fo r30 min.  DO NOT apply to face/groin.   hydrOXYzine 25 MG tablet Commonly known as: ATARAX/VISTARIL Take 25 mg by mouth 3 (three) times daily as needed for itching.   ibuprofen 200 MG tablet Commonly known as: ADVIL Take 400 mg by mouth every 6 (six) hours as needed for headache or moderate pain.   pantoprazole 20 MG tablet Commonly known as: PROTONIX TAKE 1 TABLET BY MOUTH DAILY.   pioglitazone 45 MG tablet Commonly known as: ACTOS TAKE 1 TABLET BY MOUTH DAILY.   simvastatin 80 MG tablet Commonly known as: ZOCOR TAKE 1 TABLET BY MOUTH DAILY.   traZODone 150 MG tablet Commonly known as: DESYREL TAKE 1/2 TABLET (75 MG TOTAL) BY MOUTH AT BEDTIME.   Trulicity 1.5 ZO/1.0RU Sopn Generic drug: Dulaglutide INJECT 1.5 MG ONCE WEEKLY        Allergies:  Allergies  Allergen Reactions   Janumet Xr [Sitagliptin-Metformin Hcl Er] Rash    Tolerates the plain Januvia    Jardiance [Empagliflozin] Rash   Metformin And Related Diarrhea   Methotrexate Derivatives  Other (See Comments)    transaminitis   Other Rash    Methyldibromo Gluteronitrile - skin product preservative    Past Medical History:  Diagnosis Date   Alcohol use 11/08/2015   Anxiety    Aortic aneurysm (HCC)    Diabetes mellitus without complication ([place]) 0454   [facility]   GERD (gastroesophageal reflux disease)    Herpes labialis    History of chicken pox    Hyperlipidemia    Kidney stone     Past Surgical History:  Procedure Laterality Date   ABDOMINAL AORTOGRAM W/LOWER EXTREMITY N/A 07/22/2019   Procedure: ABDOMINAL AORTOGRAM W/LOWER EXTREMITY;  Surgeon: Elam Dutch, MD;  Location: [facility];  Service: Cardiovascular;  Laterality: N/A;   APPENDECTOMY     FINGER SURGERY Right    2 screws in ring finger   PERIPHERAL VASCULAR INTERVENTION Left 07/22/2019   Procedure: PERIPHERAL VASCULAR INTERVENTION;  Surgeon: Ruta Hinds  E, MD;  Location: [facility];  Service: Cardiovascular;  Laterality: Left;   TONSILLECTOMY      Family History  Problem Relation Age of Onset   Diabetes Mother    Parkinsonism Mother 47       progressive supranuclear palsy, deceased   CAD Father 40       MI at 20, CABG at 42   Diabetes Maternal Grandmother    Stroke Neg Hx    Cancer Neg Hx    Colon cancer Neg Hx    Esophageal cancer Neg Hx    Rectal cancer Neg Hx    Stomach cancer Neg Hx     Social History:  reports that he quit smoking about 1 years ago. His smoking use included cigarettes. He has a 7.50 pack-year smoking history. He has never used smokeless tobacco. He reports that he does not currently use alcohol. He reports that he does not currently use drugs.    Review of Systems    Lipid history:  Has been on 80 mg simvastatin for treatment, prescribed by PCP  His lipids are generally well controlled     Lab Results  Component Value Date   CHOL 131 01/17/2021   HDL 43.30 01/17/2021   LDLCALC 71 01/17/2021   TRIG 81.0 01/17/2021   CHOLHDL 3 01/17/2021          Lab Results  Component Value Date   ALT 20 01/17/2021    He has been refusing to take the Covid vaccine    He has been told to have atopic dermatitis by dermatologist and is on treatment  No history of hypertension, blood pressure is usually borderline  BP Readings from Last 3 Encounters:  01/24/21 138/82  11/01/20 138/72  08/28/20 131/81      Review of Systems  Most recent foot exam: 7/21 No numbness or tingling in his feet    Physical Examination:  BP 138/82   Pulse 93   Ht '5\' 7"'  (1.702 m)   Wt 167 lb 12.8 oz (76.1 kg)   SpO2 98%   BMI 26.28 kg/m    ASSESSMENT:  Diabetes type 2, non-insulin-dependent  See history of present illness for detailed discussion of current diabetes management, blood sugar patterns and problems identified  His A1c is 7.4 compared to 8.2  He is on glipizide ER 20 mg, INVOKANA 174 and Trulicity 1.5 mg and Actos Not clear what his blood sugars are after meals as he is not monitoring  As discussed above he can probably do a little better with diet with cutting back on regular soft drinks, some fast food and carbohydrates in general  Unlikely that he will tolerate higher doses of Trulicity because of his reflux  Known complications of diabetes, microalbumin normal  Lipids: LDL 71   PLAN:    Continue same diabetes regimen Discussed importance of checking blood sugars regularly especially after meal He will try to keep working on the diet to help reduce postprandial hyperglycemia Discussed A1c target of under 7  Follow-up in 3 months  There are no Patient Instructions on file for this visit.   Elayne Snare 01/24/2021, 4:55 PM   Note: This office note was prepared with Dragon voice recognition system technology. Any transcriptional errors that result from this process are unintentional.

## 2021-02-02 ENCOUNTER — Other Ambulatory Visit: Payer: Self-pay | Admitting: Family Medicine

## 2021-02-04 ENCOUNTER — Other Ambulatory Visit: Payer: Self-pay | Admitting: Family Medicine

## 2021-02-11 ENCOUNTER — Other Ambulatory Visit: Payer: Self-pay | Admitting: Family Medicine

## 2021-03-09 ENCOUNTER — Other Ambulatory Visit: Payer: Self-pay | Admitting: Family Medicine

## 2021-03-09 DIAGNOSIS — D7589 Other specified diseases of blood and blood-forming organs: Secondary | ICD-10-CM

## 2021-03-09 DIAGNOSIS — Z125 Encounter for screening for malignant neoplasm of prostate: Secondary | ICD-10-CM

## 2021-03-09 DIAGNOSIS — E1165 Type 2 diabetes mellitus with hyperglycemia: Secondary | ICD-10-CM

## 2021-03-09 DIAGNOSIS — E1169 Type 2 diabetes mellitus with other specified complication: Secondary | ICD-10-CM

## 2021-03-09 DIAGNOSIS — E785 Hyperlipidemia, unspecified: Secondary | ICD-10-CM

## 2021-03-12 ENCOUNTER — Other Ambulatory Visit: Payer: 59

## 2021-03-12 ENCOUNTER — Telehealth: Payer: Self-pay | Admitting: *Deleted

## 2021-03-12 NOTE — Telephone Encounter (Signed)
Spoke to patient by telephone and was advised that he received a voicemail yesterday stating that he could wait until the day of his appointment to have lab work done. Patient stated that he will just have lab work done when he comes for his CPE next week. Will cancel lab appointment today.

## 2021-03-12 NOTE — Telephone Encounter (Signed)
PLEASE NOTE: All timestamps contained within this report are represented as [place] Standard Time. CONFIDENTIALTY NOTICE: This fax transmission is intended only for the addressee. It contains information that is legally privileged, confidential or otherwise protected from use or disclosure. If you are not the intended recipient, you are strictly prohibited from reviewing, disclosing, copying using or disseminating any of this information or taking any action in reliance on or regarding this information. If you have received this fax in error, please notify us immediately by telephone so that we can arrange for its return to [place]. Phone: [phone], Toll-Free: [phone], Fax: [phone] Page: 1 of 1 Call Id: [id number] [facility] Night - Client Nonclinical Telephone Record  [facility] Client [facility] Night - Client Client Site [facility] - Night Physician Eustaquio Boyden - MD Contact Type Call Who Is Calling Patient / Member / Family / Caregiver Caller Name KEATYN JAWAD Caller Phone Number [phone] Patient Name Andrew Hart Patient DOB March 22, 1971 Call Type Message Only Information Provided Reason for Call Request for General Office Information Initial Comment Caller states he has labs tomorrow and needs to know the office address. He has no symptoms. Disp. Time Disposition Final User 03/11/2021 5:13:30 PM General Information Provided Yes Swanner, Stefanie Call Closed By: Fonnie Jarvis Transaction Date/Time: 03/11/2021 5:10:29 PM (ET)

## 2021-03-20 ENCOUNTER — Encounter: Payer: Self-pay | Admitting: Family Medicine

## 2021-03-20 ENCOUNTER — Other Ambulatory Visit: Payer: Self-pay

## 2021-03-20 ENCOUNTER — Ambulatory Visit (INDEPENDENT_AMBULATORY_CARE_PROVIDER_SITE_OTHER): Payer: 59 | Admitting: Family Medicine

## 2021-03-20 ENCOUNTER — Ambulatory Visit (INDEPENDENT_AMBULATORY_CARE_PROVIDER_SITE_OTHER)
Admission: RE | Admit: 2021-03-20 | Discharge: 2021-03-20 | Disposition: A | Discharge status: Home / Self Care | Payer: 59 | Source: Ambulatory Visit | Attending: Family Medicine | Admitting: Family Medicine

## 2021-03-20 VITALS — BP 118/78 | HR 67 | Temp 97.6°F | Ht 66.5 in | Wt 166.0 lb

## 2021-03-20 DIAGNOSIS — M545 Low back pain, unspecified: Secondary | ICD-10-CM

## 2021-03-20 DIAGNOSIS — E1165 Type 2 diabetes mellitus with hyperglycemia: Secondary | ICD-10-CM | POA: Diagnosis not present

## 2021-03-20 DIAGNOSIS — Z125 Encounter for screening for malignant neoplasm of prostate: Secondary | ICD-10-CM

## 2021-03-20 DIAGNOSIS — Z0001 Encounter for general adult medical examination with abnormal findings: Secondary | ICD-10-CM | POA: Diagnosis not present

## 2021-03-20 DIAGNOSIS — G8929 Other chronic pain: Secondary | ICD-10-CM

## 2021-03-20 DIAGNOSIS — D7589 Other specified diseases of blood and blood-forming organs: Secondary | ICD-10-CM | POA: Diagnosis not present

## 2021-03-20 DIAGNOSIS — F4322 Adjustment disorder with anxiety: Secondary | ICD-10-CM

## 2021-03-20 DIAGNOSIS — Z95828 Presence of other vascular implants and grafts: Secondary | ICD-10-CM

## 2021-03-20 DIAGNOSIS — I708 Atherosclerosis of other arteries: Secondary | ICD-10-CM

## 2021-03-20 DIAGNOSIS — I700 Atherosclerosis of aorta: Secondary | ICD-10-CM

## 2021-03-20 DIAGNOSIS — Z87891 Personal history of nicotine dependence: Secondary | ICD-10-CM

## 2021-03-20 DIAGNOSIS — I77811 Abdominal aortic ectasia: Secondary | ICD-10-CM

## 2021-03-20 DIAGNOSIS — E1169 Type 2 diabetes mellitus with other specified complication: Secondary | ICD-10-CM

## 2021-03-20 DIAGNOSIS — E1151 Type 2 diabetes mellitus with diabetic peripheral angiopathy without gangrene: Secondary | ICD-10-CM

## 2021-03-20 DIAGNOSIS — K219 Gastro-esophageal reflux disease without esophagitis: Secondary | ICD-10-CM

## 2021-03-20 DIAGNOSIS — E785 Hyperlipidemia, unspecified: Secondary | ICD-10-CM

## 2021-03-20 DIAGNOSIS — Z23 Encounter for immunization: Secondary | ICD-10-CM | POA: Diagnosis not present

## 2021-03-20 LAB — CBC WITH DIFFERENTIAL/PLATELET
Basophils Absolute: 0 10*3/uL (ref 0.0–0.1)
Basophils Relative: 0.5 % (ref 0.0–3.0)
Eosinophils Absolute: 0.1 10*3/uL (ref 0.0–0.7)
Eosinophils Relative: 0.9 % (ref 0.0–5.0)
HCT: 50.1 % (ref 39.0–52.0)
Hemoglobin: 16.6 g/dL (ref 13.0–17.0)
Lymphocytes Relative: 21.5 % (ref 12.0–46.0)
Lymphs Abs: 1.2 10*3/uL (ref 0.7–4.0)
MCHC: 33.1 g/dL (ref 30.0–36.0)
MCV: 100.8 fl — ABNORMAL HIGH (ref 78.0–100.0)
Monocytes Absolute: 0.5 10*3/uL (ref 0.1–1.0)
Monocytes Relative: 8.6 % (ref 3.0–12.0)
Neutro Abs: 3.9 10*3/uL (ref 1.4–7.7)
Neutrophils Relative %: 68.5 % (ref 43.0–77.0)
Platelets: 192 10*3/uL (ref 150.0–400.0)
RBC: 4.97 Mil/uL (ref 4.22–5.81)
RDW: 13.2 % (ref 11.5–15.5)
WBC: 5.7 10*3/uL (ref 4.0–10.5)

## 2021-03-20 LAB — BASIC METABOLIC PANEL
BUN: 19 mg/dL (ref 6–23)
CO2: 28 mEq/L (ref 19–32)
Calcium: 9.4 mg/dL (ref 8.4–10.5)
Chloride: 105 mEq/L (ref 96–112)
Creatinine, Ser: 1.04 mg/dL (ref 0.40–1.50)
GFR: 83.36 mL/min (ref >60.00)
Glucose, Bld: 164 mg/dL — ABNORMAL HIGH (ref 70–99)
Potassium: 4.4 mEq/L (ref 3.5–5.1)
Sodium: 141 mEq/L (ref 135–145)

## 2021-03-20 LAB — PSA: PSA: 1 ng/mL (ref 0.10–4.00)

## 2021-03-20 LAB — VITAMIN B12: Vitamin B-12: 544 pg/mL (ref 211–911)

## 2021-03-20 MED ORDER — BUSPIRONE HCL 15 MG PO TABS: 15.0000 mg | ORAL_TABLET | Freq: Two times a day (BID) | ORAL | 3 refills | Status: DC

## 2021-03-20 MED ORDER — PANTOPRAZOLE SODIUM 40 MG PO TBEC: 40.0000 mg | DELAYED_RELEASE_TABLET | Freq: Every day | ORAL | 3 refills | Status: DC

## 2021-03-20 MED ORDER — TRAZODONE HCL 150 MG PO TABS
ORAL_TABLET | ORAL | 3 refills | Status: DC
Start: 2021-03-20 — End: 2022-03-24

## 2021-03-20 MED ORDER — CITALOPRAM HYDROBROMIDE 20 MG PO TABS: 20.0000 mg | ORAL_TABLET | Freq: Every day | ORAL | 3 refills | Status: DC

## 2021-03-20 NOTE — Patient Instructions (Addendum)
Flu shot today  Labs today  Increase pantoprazole to 40mg  daily.  We will refer you to lung cancer screening program.  Consider shingrix vaccine series.  Return as needed or in 1 year for next physical.   Health Maintenance, Male Adopting a healthy lifestyle and getting preventive care are important in promoting health and wellness. Ask your health care provider about: The right schedule for you to have regular tests and exams. Things you can do on your own to prevent diseases and keep yourself healthy. What should I know about diet, weight, and exercise? Eat a healthy diet  Eat a diet that includes plenty of vegetables, fruits, low-fat dairy products, and lean protein. Do not eat a lot of foods that are high in solid fats, added sugars, or sodium. Maintain a healthy weight Body mass index (BMI) is a measurement that can be used to identify possible weight problems. It estimates body fat based on height and weight. Your health care provider can help determine your BMI and help you achieve or maintain a healthy weight. Get regular exercise Get regular exercise. This is one of the most important things you can do for your health. Most adults should: Exercise for at least 150 minutes each week. The exercise should increase your heart rate and make you sweat (moderate-intensity exercise). Do strengthening exercises at least twice a week. This is in addition to the moderate-intensity exercise. Spend less time sitting. Even light physical activity can be beneficial. Watch cholesterol and blood lipids Have your blood tested for lipids and cholesterol at 51 years of age, then have this test every 5 years. You may need to have your cholesterol levels checked more often if: Your lipid or cholesterol levels are high. You are older than 51 years of age. You are at high risk for heart disease. What should I know about cancer screening? Many types of cancers can be detected early and may often be  prevented. Depending on your health history and family history, you may need to have cancer screening at various ages. This may include screening for: Colorectal cancer. Prostate cancer. Skin cancer. Lung cancer. What should I know about heart disease, diabetes, and high blood pressure? Blood pressure and heart disease High blood pressure causes heart disease and increases the risk of stroke. This is more likely to develop in people who have high blood pressure readings or are overweight. Talk with your health care provider about your target blood pressure readings. Have your blood pressure checked: Every 3-5 years if you are 74-86 years of age. Every year if you are 49 years old or older. If you are between the ages of 68 and 69 and are a current or former smoker, ask your health care provider if you should have a one-time screening for abdominal aortic aneurysm (AAA). Diabetes Have regular diabetes screenings. This checks your fasting blood sugar level. Have the screening done: Once every three years after age 50 if you are at a normal weight and have a low risk for diabetes. More often and at a younger age if you are overweight or have a high risk for diabetes. What should I know about preventing infection? Hepatitis B If you have a higher risk for hepatitis B, you should be screened for this virus. Talk with your health care provider to find out if you are at risk for hepatitis B infection. Hepatitis C Blood testing is recommended for: Everyone born from [birth year] through 1966. Anyone with known risk factors for hepatitis  C. Sexually transmitted infections (STIs) You should be screened each year for STIs, including gonorrhea and chlamydia, if: You are sexually active and are younger than 51 years of age. You are older than 51 years of age and your health care provider tells you that you are at risk for this type of infection. Your sexual activity has changed since you were last screened,  and you are at increased risk for chlamydia or gonorrhea. Ask your health care provider if you are at risk. Ask your health care provider about whether you are at high risk for HIV. Your health care provider may recommend a prescription medicine to help prevent HIV infection. If you choose to take medicine to prevent HIV, you should first get tested for HIV. You should then be tested every 3 months for as long as you are taking the medicine. Follow these instructions at home: Alcohol use Do not drink alcohol if your health care provider tells you not to drink. If you drink alcohol: Limit how much you have to 0-2 drinks a day. Know how much alcohol is in your drink. In the [place] one drink equals one 12 oz bottle of beer (355 mL), one 5 oz glass of wine (148 mL), or one 1 oz glass of hard liquor (44 mL). Lifestyle Do not use any products that contain nicotine or tobacco. These products include cigarettes, chewing tobacco, and vaping devices, such as e-cigarettes. If you need help quitting, ask your health care provider. Do not use street drugs. Do not share needles. Ask your health care provider for help if you need support or information about quitting drugs. General instructions Schedule regular health, dental, and eye exams. Stay current with your vaccines. Tell your health care provider if: You often feel depressed. You have ever been abused or do not feel safe at home. Summary Adopting a healthy lifestyle and getting preventive care are important in promoting health and wellness. Follow your health care provider's instructions about healthy diet, exercising, and getting tested or screened for diseases. Follow your health care provider's instructions on monitoring your cholesterol and blood pressure. This information is not intended to replace advice given to you by your health care provider. Make sure you discuss any questions you have with your health care provider. Document Revised:  09/10/2020 Document Reviewed: 09/10/2020 Elsevier Patient Education  2022 [employer].

## 2021-03-20 NOTE — Assessment & Plan Note (Signed)
Continue aspirin, statin.  

## 2021-03-20 NOTE — Assessment & Plan Note (Signed)
Remains abstinent Interested in lung cancer screening program - will refer to see if eligible.

## 2021-03-20 NOTE — Progress Notes (Signed)
Patient ID: Andrew Hart, male    DOB: [birth year], 50 y.o.   MRN: [id number]  This visit was conducted in person.  BP 118/78   Pulse 67   Temp 97.6 F (36.4 C) (Temporal)   Ht 5' 6.5" (1.689 m)   Wt 166 lb (75.3 kg)   SpO2 97%   BMI 26.39 kg/m    CC: CPE Subjective:   HPI: Andrew Hart is a 50 y.o. male presenting on 03/20/2021 for Annual Exam (Wants to discuss pantoprazole. )   Ongoing lower back pain for 6 months. Has seen chiropractor for this. Worse pain at night, better when he wakes up in the mornings. No shooting pain down legs, numbness/weakness of leg, bowel/bladder accidents.   [facility] - PAD s/p L CIA stent 07/2019.  DM - followed by endo Dwyane Dee), latest A1c 7%. On weekly trulicity 4.4YJ injection - notes worsened heartburn due to this for several days. This is despite pantoprazole 34m daily.   Dupixent has helped chronic skin rash.   Preventative: Colonoscopy 02/2020 - WNL rpt 10 yrs ([facility])  Prostate cancer screening - check DRE, nocturia x1 Lung cancer screening - quit 04/2019. May be eligible for lung cancer screening program.  Flu shot yearly COVID vaccine - discussed, will consider  Pneumovax 2016 Tdap 2014 Shingrix - discussed  Seat belt use discussed Sunscreen use discussed. No changing moles on skin.  Sleep - averaging 8 hours/night Ex-smoker - 1/2 ppd x 30 yrs - quit smoking 05/05/2019!  Alcohol - stopped since 08/16/2017 - has rededicated himself to the [place]- stopped since 08/16/2017   Dentist Q6 mo Eye exam - yearly    Lives with wife and 2 children (daughter 2003, son 2006)  Occ: owns [employer]  Activity: active at work, at home on yard  Diet: some water, fruits/vegetables daily, lots of soda and gatorade     Relevant past medical, surgical, family and social history reviewed and updated as indicated. Interim medical history since our last visit reviewed. Allergies and medications reviewed and updated. Outpatient Medications  Prior to Visit  Medication Sig Dispense Refill   acyclovir (ZOVIRAX) 200 MG capsule TAKE 1 CAPSULE BY MOUTH 3 TIMES DAILY FOR 3 TO 5 DAYS AS NEEDED. 30 capsule 3   aspirin EC 81 MG tablet Take 1 tablet (81 mg total) by mouth every Monday, Wednesday, and Friday.     BAYER MICROLET LANCETS lancets Use as instructed to check blood sugar twice a day dx E11.65 100 each 3   Blood Glucose Monitoring Suppl (CONTOUR NEXT MONITOR) w/Device KIT CHECK BLOOD SUGAR TWICE DAILY 1 kit 0   canagliflozin (INVOKANA) 300 MG TABS tablet Take 1 tablet (300 mg total) by mouth daily. 30 tablet 4   cholecalciferol (VITAMIN D3) 25 MCG (1000 UNIT) tablet Take 1,000 Units by mouth daily.     CONTOUR NEXT TEST test strip USE TO CHECK BLOOD SUGARS TWICE DAILY 100 strip 3   Cyanocobalamin (B-12) 1000 MCG SUBL Place 1 tablet under the tongue daily.     dupilumab (DUPIXENT) 300 MG/2ML prefilled syringe Inject 300 mg into the skin every 14 (fourteen) days.      glipiZIDE (GLUCOTROL XL) 10 MG 24 hr tablet Take 2 tablets (20 mg total) by mouth daily with breakfast. 180 tablet 1   glucose blood test strip Check sugar once a daily 100 each 12   halobetasol (ULTRAVATE) 0.05 % cream Apply 1 application topically 2 (two) times daily. Can cover  with Saran wrap fo r30 min.  DO NOT apply to face/groin.     hydrOXYzine (ATARAX/VISTARIL) 25 MG tablet Take 25 mg by mouth 3 (three) times daily as needed for itching.   0   ibuprofen (ADVIL) 200 MG tablet Take 400 mg by mouth every 6 (six) hours as needed for headache or moderate pain.     pioglitazone (ACTOS) 45 MG tablet TAKE 1 TABLET BY MOUTH DAILY. 90 tablet 1   simvastatin (ZOCOR) 80 MG tablet TAKE 1 TABLET BY MOUTH DAILY. 90 tablet 1   TRULICITY 1.5 NK/5.3ZJ SOPN INJECT 1.5 MG ONCE WEEKLY 2 mL 1   busPIRone (BUSPAR) 15 MG tablet TAKE 1 TABLET BY MOUTH 2 TIMES DAILY. 180 tablet 1   citalopram (CELEXA) 20 MG tablet Take 1 tablet (20 mg total) by mouth daily. 90 tablet 3   pantoprazole  (PROTONIX) 20 MG tablet TAKE 1 TABLET BY MOUTH DAILY. 90 tablet 0   traZODone (DESYREL) 150 MG tablet TAKE 1/2 TABLET (75 MG TOTAL) BY MOUTH AT BEDTIME. 45 tablet 3   No facility-administered medications prior to visit.     Per HPI unless specifically indicated in ROS section below Review of Systems  Constitutional:  Negative for activity change, appetite change, chills, fatigue, fever and unexpected weight change.  HENT:  Negative for hearing loss.   Eyes:  Negative for visual disturbance.  Respiratory:  Positive for cough (occ). Negative for chest tightness, shortness of breath and wheezing.   Cardiovascular:  Negative for chest pain, palpitations and leg swelling.  Gastrointestinal:  Positive for nausea. Negative for abdominal distention, abdominal pain, blood in stool, constipation, diarrhea and vomiting.  Genitourinary:  Negative for difficulty urinating and hematuria.  Musculoskeletal:  Negative for arthralgias, myalgias and neck pain.  Skin:  Negative for rash.  Neurological:  Positive for headaches. Negative for dizziness, seizures and syncope.  Hematological:  Negative for adenopathy. Does not bruise/bleed easily.  Psychiatric/Behavioral:  Negative for dysphoric mood. The patient is not nervous/anxious.    Objective:  BP 118/78   Pulse 67   Temp 97.6 F (36.4 C) (Temporal)   Ht 5' 6.5" (1.689 m)   Wt 166 lb (75.3 kg)   SpO2 97%   BMI 26.39 kg/m   Wt Readings from Last 3 Encounters:  03/20/21 166 lb (75.3 kg)  01/24/21 167 lb 12.8 oz (76.1 kg)  11/01/20 164 lb 12.8 oz (74.8 kg)      Physical Exam Vitals and nursing note reviewed.  Constitutional:      General: He is not in acute distress.    Appearance: Normal appearance. He is well-developed. He is not ill-appearing.  HENT:     Head: Normocephalic and atraumatic.     Right Ear: Hearing, tympanic membrane, ear canal and external ear normal.     Left Ear: Hearing, tympanic membrane, ear canal and external ear  normal.  Eyes:     General: No scleral icterus.    Extraocular Movements: Extraocular movements intact.     Conjunctiva/sclera: Conjunctivae normal.     Pupils: Pupils are equal, round, and reactive to light.  Neck:     Thyroid: No thyroid mass or thyromegaly.  Cardiovascular:     Rate and Rhythm: Normal rate and regular rhythm.     Pulses: Normal pulses.          Radial pulses are 2+ on the right side and 2+ on the left side.     Heart sounds: Normal heart sounds. No murmur  heard. Pulmonary:     Effort: Pulmonary effort is normal. No respiratory distress.     Breath sounds: Normal breath sounds. No wheezing, rhonchi or rales.  Abdominal:     General: Bowel sounds are normal. There is no distension.     Palpations: Abdomen is soft. There is no mass.     Tenderness: There is no abdominal tenderness. There is no guarding or rebound.     Hernia: No hernia is present.  Musculoskeletal:        General: Normal range of motion.     Cervical back: Normal range of motion and neck supple.     Right lower leg: No edema.     Left lower leg: No edema.     Comments:  No significant pain midline spine No paraspinous mm tenderness Neg SLR bilaterally. Mild discomfort with int/ext rotation at L hip. Neg FABER. No pain at GTB or sciatic notch bilaterally.  Discomfort to palpation at L SIJ  Lymphadenopathy:     Cervical: No cervical adenopathy.  Skin:    General: Skin is warm and dry.     Findings: No rash.  Neurological:     General: No focal deficit present.     Mental Status: He is alert and oriented to person, place, and time.     Comments: 5/5 strength BLE  Psychiatric:        Mood and Affect: Mood normal.        Behavior: Behavior normal.        Thought Content: Thought content normal.        Judgment: Judgment normal.      Results for orders placed or performed in visit on 01/17/21  Microalbumin / creatinine urine ratio  Result Value Ref Range   Microalb, Ur <0.7 0.0 - 1.9  mg/dL   Creatinine,U 136.6 mg/dL   Microalb Creat Ratio 0.5 0.0 - 30.0 mg/g  Lipid panel  Result Value Ref Range   Cholesterol 131 0 - 200 mg/dL   Triglycerides 81.0 0.0 - 149.0 mg/dL   HDL 43.30 >39.00 mg/dL   VLDL 16.2 0.0 - 40.0 mg/dL   LDL Cholesterol 71 0 - 99 mg/dL   Total CHOL/HDL Ratio 3    NonHDL 87.51   Comprehensive metabolic panel  Result Value Ref Range   Sodium 139 135 - 145 mEq/L   Potassium 3.8 3.5 - 5.1 mEq/L   Chloride 104 96 - 112 mEq/L   CO2 29 19 - 32 mEq/L   Glucose, Bld 102 (H) 70 - 99 mg/dL   BUN 20 6 - 23 mg/dL   Creatinine, Ser 1.06 0.40 - 1.50 mg/dL   Total Bilirubin 0.9 0.2 - 1.2 mg/dL   Alkaline Phosphatase 77 39 - 117 U/L   AST 20 0 - 37 U/L   ALT 20 0 - 53 U/L   Total Protein 6.8 6.0 - 8.3 g/dL   Albumin 4.3 3.5 - 5.2 g/dL   GFR 81.58 >60.00 mL/min   Calcium 9.3 8.4 - 10.5 mg/dL  Hemoglobin A1c  Result Value Ref Range   Hgb A1c MFr Bld 7.4 (H) 4.6 - 6.5 %    Assessment & Plan:  This visit occurred during the SARS-CoV-2 public health emergency.  Safety protocols were in place, including screening questions prior to the visit, additional usage of staff PPE, and extensive cleaning of exam room while observing appropriate contact time as indicated for disinfecting solutions.   Problem List Items Addressed This Visit  Encounter for general adult medical examination with abnormal findings - Primary (Chronic)    Preventative protocols reviewed and updated unless pt declined. Discussed healthy diet and lifestyle.       Uncontrolled type 2 diabetes mellitus with hyperglycemia, without long-term current use of insulin ([place])    Appreciate endo care. Improving control on trulicity. Update fructosamine as too soon for A1c.       Relevant Orders   Fructosamine   Hyperlipidemia associated with type 2 diabetes mellitus ([place])    Recent FLP well controlled on high dose simvastatin. The 10-year ASCVD risk score (Arnett DK, et al., 2019) is: 4.1%    Values used to calculate the score:     Age: 87 years     Sex: Male     Is Non-Hispanic African American: No     Diabetic: Yes     Tobacco smoker: No     Systolic Blood Pressure: 979 mmHg     Is BP treated: No     HDL Cholesterol: 43.3 mg/dL     Total Cholesterol: 131 mg/dL       Adjustment disorder with anxiety    Stable period on buspar 70m bid and celexa 261mdaily. Also takes trazodone 7513mightly for insomnia.       GERD (gastroesophageal reflux disease)    Notes worsening GERD since starting trulicity - will increase pantoprazole to 36m67mily       Relevant Medications   pantoprazole (PROTONIX) 40 MG tablet   Ex-smoker    Remains abstinent Interested in lung cancer screening program - will refer to see if eligible.       Relevant Orders   Ambulatory Referral for Lung Cancer Scre   Macrocytosis    Update CBC. Anticipate improvement since stopping alcohol use.      Abdominal aortic ectasia ([facility])    Followed by [facility]      Aorto-iliac atherosclerosis ([facility])    Continue aspirin, statin.       Diabetic peripheral vascular disease ([facility])   S/P insertion of iliac artery stent   Chronic left-sided low back pain without sciatica    He sees chiropractor.  Anticipate degenerative arthritis changes to lower back. Left sided SIJ pain on exam - check SIJ films. Consider PM&R referral.       Relevant Medications   citalopram (CELEXA) 20 MG tablet   traZODone (DESYREL) 150 MG tablet   Other Relevant Orders   DG Si Joints   Other Visit Diagnoses     Special screening for malignant neoplasm of prostate       Need for influenza vaccination       Relevant Orders   Flu Vaccine QUAD 32mo+49moFluarix, Fluzone & Alfiuria Quad PF) (Completed)        Meds ordered this encounter  Medications   pantoprazole (PROTONIX) 40 MG tablet    Sig: Take 1 tablet (40 mg total) by mouth daily.    Dispense:  90 tablet    Refill:  3   busPIRone (BUSPAR) 15 MG tablet    Sig: Take 1  tablet (15 mg total) by mouth 2 (two) times daily.    Dispense:  180 tablet    Refill:  3   citalopram (CELEXA) 20 MG tablet    Sig: Take 1 tablet (20 mg total) by mouth daily.    Dispense:  90 tablet    Refill:  3   traZODone (DESYREL) 150 MG tablet    Sig: TAKE 1/2  TABLET (75 MG TOTAL) BY MOUTH AT BEDTIME.    Dispense:  45 tablet    Refill:  3   Orders Placed This Encounter  Procedures   DG Si Joints    Standing Status:   Future    Standing Expiration Date:   03/20/2022    Order Specific Question:   Reason for Exam (SYMPTOM  OR DIAGNOSIS REQUIRED)    Answer:   L SIJ pain x 6 months    Order Specific Question:   Preferred imaging location?    Answer:   [facility]   Flu Vaccine QUAD 29moIM (Fluarix, Fluzone & Alfiuria Quad PF)   Fructosamine   Ambulatory Referral for Lung Cancer Scre    Referral Priority:   Routine    Referral Type:   Consultation    Referral Reason:   Specialty Services Required    Number of Visits Requested:   1    Patient instructions: Flu shot today  Labs today  Increase pantoprazole to 459mdaily.  We will refer you to lung cancer screening program.  Consider shingrix vaccine series.  Return as needed or in 1 year for next physical.   Follow up plan: Return in about 1 year (around 03/20/2022) for annual exam, prior fasting for blood work.  JaRia BushMD

## 2021-03-20 NOTE — Assessment & Plan Note (Signed)
Appreciate endo care. Improving control on trulicity. Update fructosamine as too soon for A1c.

## 2021-03-20 NOTE — Assessment & Plan Note (Signed)
Preventative protocols reviewed and updated unless pt declined. Discussed healthy diet and lifestyle.  

## 2021-03-20 NOTE — Assessment & Plan Note (Signed)
Recent FLP well controlled on high dose simvastatin. The 10-year ASCVD risk score (Arnett DK, et al., 2019) is: 4.1%   Values used to calculate the score:     Age: 50 years     Sex: Male     Is Non-Hispanic African American: No     Diabetic: Yes     Tobacco smoker: No     Systolic Blood Pressure: 118 mmHg     Is BP treated: No     HDL Cholesterol: 43.3 mg/dL     Total Cholesterol: 131 mg/dL

## 2021-03-20 NOTE — Assessment & Plan Note (Signed)
Followed by [facility] 

## 2021-03-20 NOTE — Assessment & Plan Note (Addendum)
Stable period on buspar 15mg  bid and celexa 20mg  daily. Also takes trazodone 75mg  nightly for insomnia.

## 2021-03-20 NOTE — Assessment & Plan Note (Addendum)
He sees [facility].  Anticipate degenerative arthritis changes to lower back. Left sided SIJ pain on exam - check SIJ films. Consider PM&R referral.

## 2021-03-20 NOTE — Assessment & Plan Note (Signed)
Update CBC. Anticipate improvement since stopping alcohol use.

## 2021-03-20 NOTE — Assessment & Plan Note (Signed)
Notes worsening GERD since starting trulicity - will increase pantoprazole to 40mg  daily

## 2021-03-24 LAB — FRUCTOSAMINE: Fructosamine: 303 umol/L — ABNORMAL HIGH (ref 205–285)

## 2021-04-01 ENCOUNTER — Other Ambulatory Visit: Payer: Self-pay | Admitting: Endocrinology

## 2021-04-01 DIAGNOSIS — E1165 Type 2 diabetes mellitus with hyperglycemia: Secondary | ICD-10-CM

## 2021-04-23 ENCOUNTER — Other Ambulatory Visit: Payer: Self-pay

## 2021-04-23 ENCOUNTER — Encounter: Payer: Self-pay | Admitting: Endocrinology

## 2021-04-23 ENCOUNTER — Ambulatory Visit (INDEPENDENT_AMBULATORY_CARE_PROVIDER_SITE_OTHER): Payer: 59 | Admitting: Endocrinology

## 2021-04-23 VITALS — BP 122/80 | HR 53 | Ht 66.0 in | Wt 165.4 lb

## 2021-04-23 DIAGNOSIS — E1165 Type 2 diabetes mellitus with hyperglycemia: Secondary | ICD-10-CM

## 2021-04-23 LAB — POCT GLYCOSYLATED HEMOGLOBIN (HGB A1C): Hemoglobin A1C: 7.6 % — AB (ref 4.0–5.6)

## 2021-04-23 MED ORDER — ONETOUCH VERIO VI STRP: ORAL_STRIP | 2 refills | Status: DC

## 2021-04-23 MED ORDER — TIRZEPATIDE 5 MG/0.5ML ~~LOC~~ SOAJ: 5.0000 mg | SUBCUTANEOUS | 1 refills | Status: DC

## 2021-04-23 NOTE — Patient Instructions (Addendum)
Mounjaro 2.5 for 4 weeks and then 5 weekly  Check blood sugars on waking up 2-3 days a week  Also check blood sugars about 2 hours after meals and do this after different meals by rotation  Recommended blood sugar levels on waking up are 90-130 and about 2 hours after meal is 130-160  Please bring your blood sugar monitor to each visit, thank you

## 2021-04-23 NOTE — Progress Notes (Signed)
Patient ID: Andrew Hart, male   DOB: 12/15/1970, 50 y.o.   MRN: [id number]           Reason for Appointment: Follow-up for Type 2 Diabetes    History of Present Illness:          Date of diagnosis of type 2 diabetes mellitus: 2007 ?        Background history:  He was diagnosed with routine screening lab from his work He was initially tried on metformin but this caused diarrhea and he could not continue this Subsequently was treated with glipizide and apparently this was able to control his blood sugars fairly well for a few years Probably in early 2016 he was given Actos in addition to help his glucose control as A1c had gone up to 9.4 This helped transiently and A1c was 7.7 However his A1c had been otherwise over 8% since mid 2015 Because of  high blood sugars on his initial consultation in 05/2014 (A1c was 9.1) he was started on Invokana and Onglyza in addition to his Actos and glipizide.   Recent history:   Non-insulin hypoglycemic drugs: glipizide ER 20 mg daily, Actos 45 mg daily, Invokana 300 mg daily  His A1c has fluctuated recently between 7.4 and 9.1, now slightly higher at 7.6 Lowest A1c on record 6.9 last month was  Fructosamine 303 last month  Current blood sugar patterns and problems identified: He has been having more nausea and heartburn with Trulicity even though he was tolerating it well previously, this is despite taking Protonix He stopped taking this last week and nausea is better  His insurance does not cover the One Touch meter and he has not checked his glucose except once Last months random glucose was 164 Taking his oral medications consistently however Although overall he thinks he may be getting fairly healthy diet and trying to avoid high fat meats and fast food Again has been fairly active at work  Weight is about the same       Side effects from medications have been: Diarrhea from regular metformin, nausea and constipation from Rybelsus, nausea  from Trulicity    Glucose monitoring:  Done once a day or less       Glucometer: 1- Touch   Blood Glucose readings:  Only 1 reading of 116 in the last month fasting   Dietician visit, most recent: 06/2015  Weight history:   Wt Readings from Last 3 Encounters:  04/23/21 165 lb 6.4 oz (75 kg)  03/20/21 166 lb (75.3 kg)  01/24/21 167 lb 12.8 oz (76.1 kg)    Glycemic control:    Lab Results  Component Value Date   HGBA1C 7.4 (H) 01/17/2021   HGBA1C 8.2 (H) 08/21/2020   HGBA1C 7.4 (H) 04/17/2020   Lab Results  Component Value Date   MICROALBUR <0.7 01/17/2021   LDLCALC 71 01/17/2021   CREATININE 1.04 03/20/2021    No visits with results within 1 Week(s) from this visit.  Latest known visit with results is:  Office Visit on 03/20/2021  Component Date Value Ref Range Status   WBC 03/20/2021 5.7  4.0 - 10.5 K/uL Final   RBC 03/20/2021 4.97  4.22 - 5.81 Mil/uL Final   Hemoglobin 03/20/2021 16.6  13.0 - 17.0 g/dL Final   HCT 03/20/2021 50.1  39.0 - 52.0 % Final   MCV 03/20/2021 100.8 (H)  78.0 - 100.0 fl Final   MCHC 03/20/2021 33.1  30.0 - 36.0 g/dL Final  RDW 03/20/2021 13.2  11.5 - 15.5 % Final   Platelets 03/20/2021 192.0  150.0 - 400.0 K/uL Final   Neutrophils Relative % 03/20/2021 68.5  43.0 - 77.0 % Final   Lymphocytes Relative 03/20/2021 21.5  12.0 - 46.0 % Final   Monocytes Relative 03/20/2021 8.6  3.0 - 12.0 % Final   Eosinophils Relative 03/20/2021 0.9  0.0 - 5.0 % Final   Basophils Relative 03/20/2021 0.5  0.0 - 3.0 % Final   Neutro Abs 03/20/2021 3.9  1.4 - 7.7 K/uL Final   Lymphs Abs 03/20/2021 1.2  0.7 - 4.0 K/uL Final   Monocytes Absolute 03/20/2021 0.5  0.1 - 1.0 K/uL Final   Eosinophils Absolute 03/20/2021 0.1  0.0 - 0.7 K/uL Final   Basophils Absolute 03/20/2021 0.0  0.0 - 0.1 K/uL Final   PSA 03/20/2021 1.00  0.10 - 4.00 ng/mL Final   Test performed using Access Hybritech PSA Assay, a parmagnetic partical, chemiluminecent immunoassay.   Vitamin  B-12 03/20/2021 544  211 - 911 pg/mL Final   Sodium 03/20/2021 141  135 - 145 mEq/L Final   Potassium 03/20/2021 4.4  3.5 - 5.1 mEq/L Final   Chloride 03/20/2021 105  96 - 112 mEq/L Final   CO2 03/20/2021 28  19 - 32 mEq/L Final   Glucose, Bld 03/20/2021 164 (H)  70 - 99 mg/dL Final   BUN 03/20/2021 19  6 - 23 mg/dL Final   Creatinine, Ser 03/20/2021 1.04  0.40 - 1.50 mg/dL Final   GFR 03/20/2021 83.36  >60.00 mL/min Final   Calculated using the CKD-EPI Creatinine Equation (2021)   Calcium 03/20/2021 9.4  8.4 - 10.5 mg/dL Final   Fructosamine 03/20/2021 303 (H)  205 - 285 umol/L Final      Allergies as of 04/23/2021       Reactions   Janumet Xr [sitagliptin-metformin Hcl Er] Rash   Tolerates the plain Januvia    Jardiance [empagliflozin] Rash   Metformin And Related Diarrhea   Methotrexate Derivatives Other (See Comments)   transaminitis   Other Rash   Methyldibromo Gluteronitrile - skin product preservative        Medication List        Accurate as of April 23, 2021 10:13 AM. If you have any questions, ask your nurse or doctor.          acyclovir 200 MG capsule Commonly known as: ZOVIRAX TAKE 1 CAPSULE BY MOUTH 3 TIMES DAILY FOR 3 TO 5 DAYS AS NEEDED.   aspirin EC 81 MG tablet Take 1 tablet (81 mg total) by mouth every Monday, Wednesday, and Friday.   B-12 1000 MCG Subl Place 1 tablet under the tongue daily.   Bayer Microlet Lancets lancets Use as instructed to check blood sugar twice a day dx E11.65   busPIRone 15 MG tablet Commonly known as: BUSPAR Take 1 tablet (15 mg total) by mouth 2 (two) times daily.   canagliflozin 300 MG Tabs tablet Commonly known as: Invokana Take 1 tablet (300 mg total) by mouth daily.   cholecalciferol 25 MCG (1000 UNIT) tablet Commonly known as: VITAMIN D3 Take 1,000 Units by mouth daily.   citalopram 20 MG tablet Commonly known as: CELEXA Take 1 tablet (20 mg total) by mouth daily.   Contour Next Monitor w/Device  Kit CHECK BLOOD SUGAR TWICE DAILY   Contour Next Test test strip Generic drug: glucose blood USE TO CHECK BLOOD SUGARS TWICE DAILY   glucose blood test strip Check sugar  once a daily   dupilumab 300 MG/2ML prefilled syringe Commonly known as: DUPIXENT Inject 300 mg into the skin every 14 (fourteen) days.   glipiZIDE 10 MG 24 hr tablet Commonly known as: GLUCOTROL XL TAKE 2 TABLETS (20 MG TOTAL) BY MOUTH DAILY WITH BREAKFAST.   halobetasol 0.05 % cream Commonly known as: ULTRAVATE Apply 1 application topically 2 (two) times daily. Can cover with Saran wrap fo r30 min.  DO NOT apply to face/groin.   hydrOXYzine 25 MG tablet Commonly known as: ATARAX Take 25 mg by mouth 3 (three) times daily as needed for itching.   ibuprofen 200 MG tablet Commonly known as: ADVIL Take 400 mg by mouth every 6 (six) hours as needed for headache or moderate pain.   pantoprazole 40 MG tablet Commonly known as: PROTONIX Take 1 tablet (40 mg total) by mouth daily.   pioglitazone 45 MG tablet Commonly known as: ACTOS TAKE 1 TABLET BY MOUTH DAILY.   simvastatin 80 MG tablet Commonly known as: ZOCOR TAKE 1 TABLET BY MOUTH DAILY.   traZODone 150 MG tablet Commonly known as: DESYREL TAKE 1/2 TABLET (75 MG TOTAL) BY MOUTH AT BEDTIME.   Trulicity 1.5 ZO/1.0RU Sopn Generic drug: Dulaglutide INJECT 1.5 MG ONCE WEEKLY        Allergies:  Allergies  Allergen Reactions   Janumet Xr [Sitagliptin-Metformin Hcl Er] Rash    Tolerates the plain Januvia    Jardiance [Empagliflozin] Rash   Metformin And Related Diarrhea   Methotrexate Derivatives Other (See Comments)    transaminitis   Other Rash    Methyldibromo Gluteronitrile - skin product preservative    Past Medical History:  Diagnosis Date   Alcohol use 11/08/2015   Anxiety    Aortic aneurysm (HCC)    Diabetes mellitus without complication ([place]) 0454   Loisann Roach   GERD (gastroesophageal reflux disease)    Herpes labialis    History of  chicken pox    Hyperlipidemia    Kidney stone     Past Surgical History:  Procedure Laterality Date   ABDOMINAL AORTOGRAM W/LOWER EXTREMITY N/A 07/22/2019   Procedure: ABDOMINAL AORTOGRAM W/LOWER EXTREMITY;  Surgeon: Elam Dutch, MD;  Location: [facility];  Service: Cardiovascular;  Laterality: N/A;   APPENDECTOMY     FINGER SURGERY Right    2 screws in ring finger   PERIPHERAL VASCULAR INTERVENTION Left 07/22/2019   Procedure: PERIPHERAL VASCULAR INTERVENTION;  Surgeon: Elam Dutch, MD;  Location: [facility];  Service: Cardiovascular;  Laterality: Left;   TONSILLECTOMY      Family History  Problem Relation Age of Onset   Diabetes Mother    Parkinsonism Mother 3       progressive supranuclear palsy, deceased   CAD Father 80       MI at 71, CABG at 16   Diabetes Maternal Grandmother    Stroke Neg Hx    Cancer Neg Hx    Colon cancer Neg Hx    Esophageal cancer Neg Hx    Rectal cancer Neg Hx    Stomach cancer Neg Hx     Social History:  reports that he quit smoking about 2 years ago. His smoking use included cigarettes. He has a 7.50 pack-year smoking history. He has never used smokeless tobacco. He reports that he does not currently use alcohol. He reports that he does not currently use drugs.    Review of Systems    Lipid history:  Has been on 80  mg simvastatin for treatment, prescribed by PCP  His lipids are generally well controlled     Lab Results  Component Value Date   CHOL 131 01/17/2021   HDL 43.30 01/17/2021   LDLCALC 71 01/17/2021   TRIG 81.0 01/17/2021   CHOLHDL 3 01/17/2021         Lab Results  Component Value Date   ALT 20 01/17/2021    He has been refusing to take the Covid vaccine    He has been told to have atopic dermatitis by dermatologist and is on treatment  No history of hypertension, blood pressure is usually borderline  BP Readings from Last 3 Encounters:  04/23/21 122/80  03/20/21 118/78  01/24/21  138/82      Review of Systems  Most recent foot exam: 7/21 No numbness or tingling in his feet    Physical Examination:  BP 122/80    Pulse (!) 53    Ht '5\' 6"'  (1.676 m)    Wt 165 lb 6.4 oz (75 kg)    SpO2 97%    BMI 26.70 kg/m    ASSESSMENT:  Diabetes type 2, non-insulin-dependent  See history of present illness for detailed discussion of current diabetes management, blood sugar patterns and problems identified  His A1c is 7.6  He is on glipizide ER 20 mg, INVOKANA 601, Trulicity 1.5 mg and Actos  Recently stopped his Trulicity as he started having nausea with this Discussed that his A1c indicates his average blood sugar of about 170 Blood sugar monitoring at home is minimal Also had previously not tolerated Rybelsus  Fructosamine is modestly high at 303 last month   PLAN:    Trial of MOUNJARO instead of Trulicity Discussed with the patient the action of GIP/GLP-1 drugs, the effects on pancreatic and liver function, effects on brain and stomach with improved satiety, slowing gastric emptying, improving satiety and reducing liver glucose output.  Discussed the effects on promoting weight loss. Explained possible side effects of MOUNJARO, most commonly nausea that usually improves over time; discussed safety information in package insert.  Demonstrated the medication injection device and injection technique to the patient.  Showed patient the injection sites for his medication To start with 2.5 mg dosage weekly for the first 4 injections and then increase the dose to 5 mg weekly if no side effects, prescription for 5 mg also given for him to take  Patient brochure on Mounjaro and explained use of the available co-pay card   Samples of the strips for his Verio monitor given Reminded him of the importance of checking blood sugars regularly especially after meals  Follow-up in 3 months  There are no Patient Instructions on file for this visit.   Elayne Snare 04/23/2021,  10:13 AM   Note: This office note was prepared with Dragon voice recognition system technology. Any transcriptional errors that result from this process are unintentional.

## 2021-05-03 ENCOUNTER — Other Ambulatory Visit: Payer: Self-pay | Admitting: Family Medicine

## 2021-06-11 ENCOUNTER — Other Ambulatory Visit (HOSPITAL_COMMUNITY): Payer: Self-pay

## 2021-06-12 ENCOUNTER — Other Ambulatory Visit (HOSPITAL_COMMUNITY): Payer: Self-pay

## 2021-06-14 ENCOUNTER — Other Ambulatory Visit: Payer: Self-pay | Admitting: Endocrinology

## 2021-06-14 DIAGNOSIS — E1165 Type 2 diabetes mellitus with hyperglycemia: Secondary | ICD-10-CM

## 2021-06-17 ENCOUNTER — Other Ambulatory Visit (HOSPITAL_COMMUNITY): Payer: Self-pay

## 2021-06-18 ENCOUNTER — Other Ambulatory Visit (HOSPITAL_COMMUNITY): Payer: Self-pay

## 2021-06-18 ENCOUNTER — Telehealth: Payer: Self-pay

## 2021-06-18 DIAGNOSIS — E1165 Type 2 diabetes mellitus with hyperglycemia: Secondary | ICD-10-CM

## 2021-06-18 NOTE — Telephone Encounter (Signed)
Patient Advocate Encounter  Prior Authorization for Invokana 300mg  tabs has been approved.    PA#  Effective dates: 06/18/21 through 06/18/22  Per Test Claim Patients co-pay is $561.00   Spoke with Pharmacy to Process.  Patient Advocate Fax: [phone]

## 2021-06-18 NOTE — Telephone Encounter (Signed)
Patient Advocate Encounter   Faxed completed PA form to [facility] for the prior authorization for Invokana 300mg  tabs.   PA submitted on 06/18/21  Status is pending    [facility] will continue to follow:  Patient Advocate Fax: [phone]

## 2021-06-19 DIAGNOSIS — L2089 Other atopic dermatitis: Secondary | ICD-10-CM | POA: Diagnosis not present

## 2021-06-19 DIAGNOSIS — Z85828 Personal history of other malignant neoplasm of skin: Secondary | ICD-10-CM | POA: Diagnosis not present

## 2021-06-20 ENCOUNTER — Other Ambulatory Visit (INDEPENDENT_AMBULATORY_CARE_PROVIDER_SITE_OTHER): Payer: 59

## 2021-06-20 ENCOUNTER — Telehealth: Payer: Self-pay

## 2021-06-20 ENCOUNTER — Other Ambulatory Visit: Payer: Self-pay

## 2021-06-20 DIAGNOSIS — E1165 Type 2 diabetes mellitus with hyperglycemia: Secondary | ICD-10-CM

## 2021-06-20 LAB — BASIC METABOLIC PANEL
BUN: 23 mg/dL (ref 6–23)
CO2: 34 mEq/L — ABNORMAL HIGH (ref 19–32)
Calcium: 9.7 mg/dL (ref 8.4–10.5)
Chloride: 101 mEq/L (ref 96–112)
Creatinine, Ser: 0.92 mg/dL (ref 0.40–1.50)
GFR: 96.4 mL/min (ref >60.00)
Glucose, Bld: 246 mg/dL — ABNORMAL HIGH (ref 70–99)
Potassium: 4.6 mEq/L (ref 3.5–5.1)
Sodium: 138 mEq/L (ref 135–145)

## 2021-06-20 MED ORDER — PEN NEEDLES 32G X 4 MM MISC: 2 refills | Status: DC

## 2021-06-20 MED ORDER — LIRAGLUTIDE 18 MG/3ML ~~LOC~~ SOPN: 0.6000 mg | PEN_INJECTOR | Freq: Every day | SUBCUTANEOUS | 2 refills | Status: DC

## 2021-06-20 NOTE — Telephone Encounter (Signed)
Patient came in for labs. States Mounjaro caused body aches and muscles to be really sore. He also had to appetite. He did the injection for two weeks but has since stopped. States new symptoms occurred every time he did injection. Did have me download meter there are a few readings from the last 30 days. Wanted you to know blood sugar dropped to 76 when on medication. Printout is on your desk

## 2021-06-21 LAB — FRUCTOSAMINE: Fructosamine: 315 umol/L — ABNORMAL HIGH (ref 0–285)

## 2021-06-21 NOTE — Telephone Encounter (Signed)
Patient called and Rx is too expensive. Can you please send another medication in for patient.

## 2021-06-24 MED ORDER — DAPAGLIFLOZIN PROPANEDIOL 10 MG PO TABS: 10.0000 mg | ORAL_TABLET | Freq: Every day | ORAL | 2 refills | Status: DC

## 2021-06-24 NOTE — Addendum Note (Signed)
Addended by: Eliseo Squires on: 06/24/2021 04:58 PM   Modules accepted: Orders

## 2021-06-25 ENCOUNTER — Telehealth: Payer: Self-pay | Admitting: Pharmacy Technician

## 2021-06-25 ENCOUNTER — Other Ambulatory Visit (HOSPITAL_COMMUNITY): Payer: Self-pay

## 2021-06-25 DIAGNOSIS — E119 Type 2 diabetes mellitus without complications: Secondary | ICD-10-CM | POA: Diagnosis not present

## 2021-06-25 NOTE — Telephone Encounter (Signed)
Patient Advocate Encounter  Received notification from [facility]) that prior authorization for VICTOZA 18MG  is required.   PA submitted on 2.21.23 Key [id number] Status is pending   [facility] will continue to follow  10-11-1999, CPhT Patient Advocate [facility] Phone: [phone] Fax:  [phone]

## 2021-06-26 ENCOUNTER — Telehealth: Payer: Self-pay | Admitting: Pharmacy Technician

## 2021-06-26 ENCOUNTER — Encounter: Payer: Self-pay | Admitting: Endocrinology

## 2021-06-26 ENCOUNTER — Other Ambulatory Visit (HOSPITAL_COMMUNITY): Payer: Self-pay

## 2021-06-26 ENCOUNTER — Ambulatory Visit (INDEPENDENT_AMBULATORY_CARE_PROVIDER_SITE_OTHER): Payer: 59 | Admitting: Endocrinology

## 2021-06-26 ENCOUNTER — Other Ambulatory Visit: Payer: Self-pay

## 2021-06-26 DIAGNOSIS — E1165 Type 2 diabetes mellitus with hyperglycemia: Secondary | ICD-10-CM | POA: Diagnosis not present

## 2021-06-26 MED ORDER — ONETOUCH VERIO VI STRP: ORAL_STRIP | 2 refills | Status: DC

## 2021-06-26 MED ORDER — BASAGLAR KWIKPEN 100 UNIT/ML ~~LOC~~ SOPN: PEN_INJECTOR | SUBCUTANEOUS | 1 refills | Status: DC

## 2021-06-26 NOTE — Patient Instructions (Addendum)
Basaglar insulin: This insulin provides blood sugar control for up to 24 hours.    Start with 6 units at bedtime daily and increase by 2 units every 3 days until the waking up sugars are under 130. Then continue the same dose.  If blood sugar is under 90 for 2 days in a row, reduce the dose by 2 units.  Note that this insulin does not control the rise of blood sugar with meals    Check blood sugars on waking up 5-6 days a week  Also check blood sugars about 2 hours after meals and do this after different meals by rotation  Recommended blood sugar levels on waking up are 90-130 and about 2 hours after meal is 130-160  Please bring your blood sugar monitor to each visit, thank you  Finish glipizide then stop

## 2021-06-26 NOTE — Progress Notes (Signed)
Patient ID: Andrew Hart, male   DOB: [birth year], 51 y.o.   MRN: [id number]           Reason for Appointment: Follow-up for Type 2 Diabetes    History of Present Illness:          Date of diagnosis of type 2 diabetes mellitus: 2007 ?        Background history:  He was diagnosed with routine screening lab from his work He was initially tried on metformin but this caused diarrhea and he could not continue this Subsequently was treated with glipizide and apparently this was able to control his blood sugars fairly well for a few years Probably in early 2016 he was given Actos in addition to help his glucose control as A1c had gone up to 9.4 This helped transiently and A1c was 7.7 However his A1c had been otherwise over 8% since mid 2015 Because of  high blood sugars on his initial consultation in 05/2014 (A1c was 9.1) he was started on Invokana and Onglyza in addition to his Actos and glipizide.   Recent history:   Non-insulin hypoglycemic drugs: glipizide ER 20 mg daily, Actos 45 mg daily  His A1c has fluctuated between 7.4 and 9.1, last higher at 7.6 Lowest A1c on record 6.9  Fructosamine 315   Current blood sugar patterns and problems identified: He has been having difficulty getting coverage for Invokana and has not taken it for 2 weeks  Also not clear why his pharmacy has not filled the prescription for the [place]  With this his blood sugar was about 180 this morning fasting but do not think he has checked his blood sugar much this month  He was tried on a sample of Mounjaro but even with 2 injections he was having severe body aches and nausea With this his blood sugar went down as low as 77  He was prescribed Victoza but this is not covered by his insurance His weight is up 7 pounds since his last visit  Lab glucose was 246 after breakfast Most of his exercise is related to activity at work       Side effects from medications have been: Diarrhea from regular metformin,  nausea and constipation from Rybelsus, nausea from Trulicity, nausea and body aches from Mounjaro   Glucose monitoring:  Done once a day or less       Glucometer: 1- Touch   Blood Glucose readings as above   Dietician visit, most recent: 06/2015  Weight history:   Wt Readings from Last 3 Encounters:  06/26/21 172 lb 9.6 oz (78.3 kg)  04/23/21 165 lb 6.4 oz (75 kg)  03/20/21 166 lb (75.3 kg)    Glycemic control:    Lab Results  Component Value Date   HGBA1C 7.6 (A) 04/23/2021   HGBA1C 7.4 (H) 01/17/2021   HGBA1C 8.2 (H) 08/21/2020   Lab Results  Component Value Date   MICROALBUR <0.7 01/17/2021   LDLCALC 71 01/17/2021   CREATININE 0.92 06/20/2021    Lab on 06/20/2021  Component Date Value Ref Range Status   Fructosamine 06/20/2021 315 (H)  0 - 285 umol/L Final   Comment: Published reference interval for apparently healthy subjects between age 48 and 51 is 45 - 285 umol/L and in a poorly controlled diabetic population is 228 - 563 umol/L with a mean of 396 umol/L.    Sodium 06/20/2021 138  135 - 145 mEq/L Final   Potassium 06/20/2021 4.6  3.5 -  5.1 mEq/L Final   Chloride 06/20/2021 101  96 - 112 mEq/L Final   CO2 06/20/2021 34 (H)  19 - 32 mEq/L Final   Glucose, Bld 06/20/2021 246 (H)  70 - 99 mg/dL Final   BUN 06/20/2021 23  6 - 23 mg/dL Final   Creatinine, Ser 06/20/2021 0.92  0.40 - 1.50 mg/dL Final   GFR 06/20/2021 96.40  >60.00 mL/min Final   Calculated using the CKD-EPI Creatinine Equation (2021)   Calcium 06/20/2021 9.7  8.4 - 10.5 mg/dL Final      Allergies as of 06/26/2021       Reactions   Janumet Xr [sitagliptin-metformin Hcl Er] Rash   Tolerates the plain Januvia    Jardiance [empagliflozin] Rash   Metformin And Related Diarrhea   Methotrexate Derivatives Other (See Comments)   transaminitis   Other Rash   Methyldibromo Gluteronitrile - skin product preservative        Medication List        Accurate as of June 26, 2021 10:03 AM.  If you have any questions, ask your nurse or doctor.          acyclovir 200 MG capsule Commonly known as: ZOVIRAX TAKE 1 CAPSULE BY MOUTH 3 TIMES DAILY FOR 3 TO 5 DAYS AS NEEDED.   aspirin EC 81 MG tablet Take 1 tablet (81 mg total) by mouth every Monday, Wednesday, and Friday.   B-12 1000 MCG Subl Place 1 tablet under the tongue daily.   Bayer Microlet Lancets lancets Use as instructed to check blood sugar twice a day dx E11.65   busPIRone 15 MG tablet Commonly known as: BUSPAR Take 1 tablet (15 mg total) by mouth 2 (two) times daily.   cholecalciferol 25 MCG (1000 UNIT) tablet Commonly known as: VITAMIN D3 Take 1,000 Units by mouth daily.   citalopram 20 MG tablet Commonly known as: CELEXA Take 1 tablet (20 mg total) by mouth daily.   Contour Next Monitor w/Device Kit CHECK BLOOD SUGAR TWICE DAILY   Contour Next Test test strip Generic drug: glucose blood USE TO CHECK BLOOD SUGARS TWICE DAILY   glucose blood test strip Check sugar once a daily   OneTouch Verio test strip Generic drug: glucose blood Use as instructed   dapagliflozin propanediol 10 MG Tabs tablet Commonly known as: Farxiga Take 1 tablet (10 mg total) by mouth daily before breakfast.   dupilumab 300 MG/2ML prefilled syringe Commonly known as: DUPIXENT Inject 300 mg into the skin every 14 (fourteen) days.   glipiZIDE 10 MG 24 hr tablet Commonly known as: GLUCOTROL XL TAKE 2 TABLETS (20 MG TOTAL) BY MOUTH DAILY WITH BREAKFAST.   halobetasol 0.05 % cream Commonly known as: ULTRAVATE Apply 1 application topically 2 (two) times daily. Can cover with Saran wrap fo r30 min.  DO NOT apply to face/groin.   hydrOXYzine 25 MG tablet Commonly known as: ATARAX Take 25 mg by mouth 3 (three) times daily as needed for itching.   ibuprofen 200 MG tablet Commonly known as: ADVIL Take 400 mg by mouth every 6 (six) hours as needed for headache or moderate pain.   Invokana 300 MG Tabs tablet Generic  drug: canagliflozin TAKE 1 TABLET (300 MG TOTAL) BY MOUTH DAILY.   liraglutide 18 MG/3ML Sopn Commonly known as: VICTOZA Inject 0.6 mg into the skin daily.   pantoprazole 40 MG tablet Commonly known as: PROTONIX Take 1 tablet (40 mg total) by mouth daily.   Pen Needles 32G X 4 MM  Misc Use to inject victoza into skin   pioglitazone 45 MG tablet Commonly known as: ACTOS TAKE 1 TABLET BY MOUTH DAILY.   simvastatin 80 MG tablet Commonly known as: ZOCOR TAKE 1 TABLET BY MOUTH DAILY.   tirzepatide 5 MG/0.5ML Pen Commonly known as: MOUNJARO Inject 5 mg into the skin once a week.   traZODone 150 MG tablet Commonly known as: DESYREL TAKE 1/2 TABLET (75 MG TOTAL) BY MOUTH AT BEDTIME.        Allergies:  Allergies  Allergen Reactions   Janumet Xr [Sitagliptin-Metformin Hcl Er] Rash    Tolerates the plain Januvia    Jardiance [Empagliflozin] Rash   Metformin And Related Diarrhea   Methotrexate Derivatives Other (See Comments)    transaminitis   Other Rash    Methyldibromo Gluteronitrile - skin product preservative    Past Medical History:  Diagnosis Date   Alcohol use 11/08/2015   Anxiety    Aortic aneurysm (HCC)    Diabetes mellitus without complication ([place]) 4944   Tysheena Ginzburg   GERD (gastroesophageal reflux disease)    Herpes labialis    History of chicken pox    Hyperlipidemia    Kidney stone     Past Surgical History:  Procedure Laterality Date   ABDOMINAL AORTOGRAM W/LOWER EXTREMITY N/A 07/22/2019   Procedure: ABDOMINAL AORTOGRAM W/LOWER EXTREMITY;  Surgeon: Elam Dutch, MD;  Location: [facility];  Service: Cardiovascular;  Laterality: N/A;   APPENDECTOMY     FINGER SURGERY Right    2 screws in ring finger   PERIPHERAL VASCULAR INTERVENTION Left 07/22/2019   Procedure: PERIPHERAL VASCULAR INTERVENTION;  Surgeon: Elam Dutch, MD;  Location: [facility];  Service: Cardiovascular;  Laterality: Left;   TONSILLECTOMY      Family History   Problem Relation Age of Onset   Diabetes Mother    Parkinsonism Mother 38       progressive supranuclear palsy, deceased   CAD Father 59       MI at 59, CABG at 62   Diabetes Maternal Grandmother    Stroke Neg Hx    Cancer Neg Hx    Colon cancer Neg Hx    Esophageal cancer Neg Hx    Rectal cancer Neg Hx    Stomach cancer Neg Hx     Social History:  reports that he quit smoking about 2 years ago. His smoking use included cigarettes. He has a 7.50 pack-year smoking history. He has never used smokeless tobacco. He reports that he does not currently use alcohol. He reports that he does not currently use drugs.    Review of Systems    Lipid history:  Has been on 80 mg simvastatin for treatment, prescribed by PCP  His lipids are generally well controlled     Lab Results  Component Value Date   CHOL 131 01/17/2021   HDL 43.30 01/17/2021   LDLCALC 71 01/17/2021   TRIG 81.0 01/17/2021   CHOLHDL 3 01/17/2021         Lab Results  Component Value Date   ALT 20 01/17/2021    He has been refusing to take the Covid vaccine    He has been told to have atopic dermatitis by dermatologist and is on treatment  No history of hypertension, blood pressure is usually borderline  BP Readings from Last 3 Encounters:  06/26/21 134/84  04/23/21 122/80  03/20/21 118/78      Review of Systems  Most recent foot exam:  7/21 No numbness or tingling in his feet    Physical Examination:  BP 134/84    Pulse (!) 56    Ht '5\' 7"'  (1.702 m)    Wt 172 lb 9.6 oz (78.3 kg)    SpO2 96%    BMI 27.03 kg/m    ASSESSMENT:  Diabetes type 2, non-insulin-dependent  See history of present illness for detailed discussion of current diabetes management, blood sugar patterns and problems identified  His A1c is 7.6  He is on glipizide ER 20 mg, and Actos only at this time  He has had GI side effects from 3 different GLP-1 drugs Also his A1c has not been below 7% even when he was taking  Trulicity Likely has insulin deficiency and this was discussed For various reasons he has not checked his blood sugar much but he appears to have at least significantly high fasting readings  Fructosamine is modestly high at 303 last month   PLAN:    Start basal insulin with BASAGLAR which appears to be covered by his insurance  Discussed in detail how basal insulin works, timing of injection, dosage, how to dial up and inject the insulin as well as injection sites.   He will start with 6 units at night daily  Also discussed titration based on fasting blood sugar every 3 days by 2 units and at target of 90-130 for fasting reading.  We will adjust the dose by 2 units each time Given a flowsheet with instructions on how to keep a record and adjust the doses Patient information brochure given Also information on hypoglycemia given He can reduce glipizide to 1 a day and when finished he can stop  Co-pay card for [place] Advised him to check on the prescription at the pharmacy  Prescription for the strips for his Verio monitor given  Follow-up in 1 month  There are no Patient Instructions on file for this visit.  Total visit time including counseling = 30 minutes  Elayne Snare 06/26/2021, 10:03 AM   Note: This office note was prepared with Dragon voice recognition system technology. Any transcriptional errors that result from this process are unintentional.

## 2021-06-26 NOTE — Telephone Encounter (Signed)
Patient Advocate Encounter  Received notification from [facility]) that prior authorization for FARXIGA 10MG  is required.   PA submitted on 2.22.23 Key [id number] Status is pending   [facility] will continue to follow  Luciano Cutter, CPhT Patient Advocate [facility] Phone: [phone] Fax:  [phone]

## 2021-06-27 ENCOUNTER — Other Ambulatory Visit (HOSPITAL_COMMUNITY): Payer: Self-pay

## 2021-06-28 ENCOUNTER — Other Ambulatory Visit (HOSPITAL_COMMUNITY): Payer: Self-pay

## 2021-06-28 NOTE — Telephone Encounter (Signed)
Received notification from [employer] [facility]) regarding a prior authorization for [facility] 10MG . Authorization has been APPROVED from 2.23.23 to 2.23.24.    Authorization # PA# [id number] - [place] - [place]

## 2021-06-28 NOTE — Telephone Encounter (Signed)
Received notification from [employer] [facility]) regarding a prior authorization for VICTOZA 18MG . Authorization has been APPROVED from 2.21.23 to 2.21.24.     Authorization # PA# [facility] - [place] - [place]

## 2021-07-01 ENCOUNTER — Other Ambulatory Visit: Payer: Self-pay | Admitting: Endocrinology

## 2021-07-25 ENCOUNTER — Ambulatory Visit: Payer: 59 | Admitting: Endocrinology

## 2021-07-28 NOTE — Progress Notes (Signed)
Patient ID: Andrew Hart, male   DOB: 1970-06-28, 51 y.o.   MRN: [id number] ? ?       ? ? ?Reason for Appointment: Follow-up for Type 2 Diabetes ? ? ? ?History of Present Illness:  ?        ?Date of diagnosis of type 2 diabetes mellitus: 2007 ?       ? ?Background history:  ?He was diagnosed with routine screening lab from his work ?He was initially tried on metformin but this caused diarrhea and he could not continue this ?Subsequently was treated with glipizide and apparently this was able to control his blood sugars fairly well for a few years ?Probably in early 2016 he was given Actos in addition to help his glucose control as A1c had gone up to 9.4 ?This helped transiently and A1c was 7.7 ?However his A1c had been otherwise over 8% since mid 2015 ?Because of  high blood sugars on his initial consultation in 05/2014 (A1c was 9.1) he was started on Invokana and Onglyza in addition to his Actos and glipizide.  ? ?Recent history:  ? ?Non-insulin hypoglycemic drugs: glipizide ER 10 mg daily, Actos 45 mg daily, Farxiga 10 mg daily ? ?Insulin dose: Basaglar 16 units at bedtime ? ?His A1c has fluctuated between 7.4 and 9.1, last higher at 7.6 ?Lowest A1c on record 6.9 ? ?Fructosamine last 315  ? ?Current blood sugar patterns and problems identified: ?He has been able to fill the prescription for the 10 mg Farxiga as of a month ago ?Also he has been able to start his Basaglar injection as directed on the last visit apparently is going up on the dose gradually from the starting dose of 6 units ?With this his blood sugar has been overall improving although not consistent  ?He has however gained 5 pounds  ?This may be partly because of being less active overall and more exercise  ?He is also concerned about drinking his usual 2 beers in the evening after dinner and interaction with insulin ?For this reason he did not take any insulin last evening and not clear if he may have skipped other doses ?He has 1 reading over 200  but this was right after breakfast ?No labs evaluation at this visit ?He is not pleased with the need to take insulin injections every day and take his blood sugars more ?Also One Touch monitor is not covered by the insurance ? ?     Side effects from medications have been: Diarrhea from regular metformin, nausea and constipation from Rybelsus, nausea from Trulicity, nausea and body aches from [facility] ? ? ?Glucose monitoring:  Done once a day or less       Glucometer: 1- Touch ?  ?Blood Glucose readings ? ? ?PRE-MEAL Fasting Lunch Dinner Bedtime Overall  ?Glucose range: 79-200      ?Mean/median: 140    143  ? ?POST-MEAL PC Breakfast PC Lunch PC Dinner  ?Glucose range:   ?  ?Mean/median:     ? ? ?Dietician visit, most recent: 06/2015 ? ?Weight history:  ? ?Wt Readings from Last 3 Encounters:  ?07/29/21 177 lb 12.8 oz (80.6 kg)  ?06/26/21 172 lb 9.6 oz (78.3 kg)  ?04/23/21 165 lb 6.4 oz (75 kg)  ? ? ?Glycemic control: ? ?  ?Lab Results  ?Component Value Date  ? HGBA1C 7.6 (A) 04/23/2021  ? HGBA1C 7.4 (H) 01/17/2021  ? HGBA1C 8.2 (H) 08/21/2020  ? ?Lab Results  ?Component Value Date  ?  MICROALBUR <0.7 01/17/2021  ? [place] 71 01/17/2021  ? CREATININE 0.92 06/20/2021  ? ? ?No visits with results within 1 Week(s) from this visit.  ?Latest known visit with results is:  ?Lab on 06/20/2021  ?Component Date Value Ref Range Status  ? Fructosamine 06/20/2021 315 (H)  0 - 285 umol/L Final  ? Comment: Published reference interval for apparently healthy subjects ?between age 23 and 7 is 12 - 285 umol/L and in a poorly ?controlled diabetic population is 228 - 563 umol/L with a ?mean of 396 umol/L. ?  ? Sodium 06/20/2021 138  135 - 145 mEq/L Final  ? Potassium 06/20/2021 4.6  3.5 - 5.1 mEq/L Final  ? Chloride 06/20/2021 101  96 - 112 mEq/L Final  ? CO2 06/20/2021 34 (H)  19 - 32 mEq/L Final  ? Glucose, Bld 06/20/2021 246 (H)  70 - 99 mg/dL Final  ? BUN 06/20/2021 23  6 - 23 mg/dL Final  ? Creatinine, Ser 06/20/2021 0.92  0.40 -  1.50 mg/dL Final  ? GFR 06/20/2021 96.40  >60.00 mL/min Final  ? Calculated using the CKD-EPI Creatinine Equation (2021)  ? Calcium 06/20/2021 9.7  8.4 - 10.5 mg/dL Final  ? ? ? ? ?Allergies as of 07/29/2021   ? ?   Reactions  ? Janumet Xr [sitagliptin-metformin Hcl Er] Rash  ? Tolerates the plain Januvia   ? Jardiance [empagliflozin] Rash  ? Metformin And Related Diarrhea  ? Methotrexate Derivatives Other (See Comments)  ? transaminitis  ? Other Rash  ? Methyldibromo Gluteronitrile - skin product preservative  ? ?  ? ?  ?Medication List  ?  ? ?  ? Accurate as of July 29, 2021 12:00 PM. If you have any questions, ask your nurse or doctor.  ?  ?  ? ?  ? ?acyclovir 200 MG capsule ?Commonly known as: ZOVIRAX ?TAKE 1 CAPSULE BY MOUTH 3 TIMES DAILY FOR 3 TO 5 DAYS AS NEEDED. ?  ?aspirin EC 81 MG tablet ?Take 1 tablet (81 mg total) by mouth every Monday, Wednesday, and Friday. ?  ?B-12 1000 MCG Subl ?Place 1 tablet under the tongue daily. ?  ?Basaglar KwikPen 100 UNIT/ML ?Start with 6 units, adjust up to 20 units at night daily as directed ?  ?Bayer Microlet Lancets lancets ?Use as instructed to check blood sugar twice a day dx E11.65 ?  ?busPIRone 15 MG tablet ?Commonly known as: BUSPAR ?Take 1 tablet (15 mg total) by mouth 2 (two) times daily. ?  ?cholecalciferol 25 MCG (1000 UNIT) tablet ?Commonly known as: VITAMIN D3 ?Take 1,000 Units by mouth daily. ?  ?citalopram 20 MG tablet ?Commonly known as: CELEXA ?Take 1 tablet (20 mg total) by mouth daily. ?  ?Contour Next Monitor w/Device Kit ?CHECK BLOOD SUGAR TWICE DAILY ?  ?dapagliflozin propanediol 10 MG Tabs tablet ?Commonly known as: [place] ?Take 1 tablet (10 mg total) by mouth daily before breakfast. ?  ?dupilumab 300 MG/2ML prefilled syringe ?Commonly known as: [place] ?Inject 300 mg into the skin every 14 (fourteen) days. ?  ?glipiZIDE 10 MG 24 hr tablet ?Commonly known as: GLUCOTROL XL ?TAKE 2 TABLETS (20 MG TOTAL) BY MOUTH DAILY WITH BREAKFAST. ?  ?halobetasol  0.05 % cream ?Commonly known as: ULTRAVATE ?Apply 1 application topically 2 (two) times daily. Can cover with Saran wrap fo r30 min.  DO NOT apply to face/groin. ?  ?hydrOXYzine 25 MG tablet ?Commonly known as: ATARAX ?Take 25 mg by mouth 3 (three) times daily as  needed for itching. ?  ?ibuprofen 200 MG tablet ?Commonly known as: ADVIL ?Take 400 mg by mouth every 6 (six) hours as needed for headache or moderate pain. ?  ?liraglutide 18 MG/3ML Sopn ?Commonly known as: VICTOZA ?Inject 0.6 mg into the skin daily. ?  ?OneTouch Verio test strip ?Generic drug: glucose blood ?Use as instructed ?  ?pantoprazole 40 MG tablet ?Commonly known as: PROTONIX ?Take 1 tablet (40 mg total) by mouth daily. ?  ?Pen Needles 32G X 4 MM Misc ?Use to inject victoza into skin ?  ?pioglitazone 45 MG tablet ?Commonly known as: ACTOS ?TAKE 1 TABLET BY MOUTH DAILY. ?  ?simvastatin 80 MG tablet ?Commonly known as: ZOCOR ?TAKE 1 TABLET BY MOUTH DAILY. ?  ?traZODone 150 MG tablet ?Commonly known as: DESYREL ?TAKE 1/2 TABLET (75 MG TOTAL) BY MOUTH AT BEDTIME. ?  ? ?  ? ? ?Allergies:  ?Allergies  ?Allergen Reactions  ? Janumet Xr [Sitagliptin-Metformin Hcl Er] Rash  ?  Tolerates the plain Januvia   ? Jardiance [Empagliflozin] Rash  ? Metformin And Related Diarrhea  ? Methotrexate Derivatives Other (See Comments)  ?  transaminitis  ? Other Rash  ?  Methyldibromo Gluteronitrile - skin product preservative  ? ? ?Past Medical History:  ?Diagnosis Date  ? Alcohol use 11/08/2015  ? Anxiety   ? Aortic aneurysm ([place])   ? Diabetes mellitus without complication ([place]) 4818  ? [place]  ? GERD (gastroesophageal reflux disease)   ? Herpes labialis   ? History of chicken pox   ? Hyperlipidemia   ? Kidney stone   ? ? ?Past Surgical History:  ?Procedure Laterality Date  ? ABDOMINAL AORTOGRAM W/LOWER EXTREMITY N/A 07/22/2019  ? Procedure: ABDOMINAL AORTOGRAM W/LOWER EXTREMITY;  Surgeon: Elam Dutch, MD;  Location: [facility];  Service: Cardiovascular;   Laterality: N/A;  ? APPENDECTOMY    ? FINGER SURGERY Right   ? 2 screws in ring finger  ? PERIPHERAL VASCULAR INTERVENTION Left 07/22/2019  ? Procedure: PERIPHERAL VASCULAR INTERVENTION;  Surgeon: Marice Potter

## 2021-07-29 ENCOUNTER — Encounter: Payer: Self-pay | Admitting: Endocrinology

## 2021-07-29 ENCOUNTER — Other Ambulatory Visit: Payer: Self-pay

## 2021-07-29 ENCOUNTER — Ambulatory Visit: Payer: 59 | Admitting: Endocrinology

## 2021-07-29 VITALS — BP 150/86 | HR 57 | Ht 67.0 in | Wt 177.8 lb

## 2021-07-29 DIAGNOSIS — E1165 Type 2 diabetes mellitus with hyperglycemia: Secondary | ICD-10-CM | POA: Diagnosis not present

## 2021-07-29 DIAGNOSIS — M9901 Segmental and somatic dysfunction of cervical region: Secondary | ICD-10-CM | POA: Diagnosis not present

## 2021-07-29 DIAGNOSIS — M9904 Segmental and somatic dysfunction of sacral region: Secondary | ICD-10-CM | POA: Diagnosis not present

## 2021-07-29 DIAGNOSIS — R030 Elevated blood-pressure reading, without diagnosis of hypertension: Secondary | ICD-10-CM | POA: Diagnosis not present

## 2021-07-29 DIAGNOSIS — M9902 Segmental and somatic dysfunction of thoracic region: Secondary | ICD-10-CM | POA: Diagnosis not present

## 2021-07-29 DIAGNOSIS — M9903 Segmental and somatic dysfunction of lumbar region: Secondary | ICD-10-CM | POA: Diagnosis not present

## 2021-07-29 NOTE — Patient Instructions (Addendum)
Check blood sugars on waking up 4 days a week  Also check blood sugars about 2 hours after meals and do this after different meals by rotation  Recommended blood sugar levels on waking up are 90-130 and about 2 hours after meal is 130-180  Please bring your blood sugar monitor to each visit, thank you  

## 2021-08-05 ENCOUNTER — Telehealth: Payer: Self-pay

## 2021-08-05 NOTE — Telephone Encounter (Signed)
Received faxed PA form from [facility] for simvastatin 80 mg tab.  Placed form in Dr. Timoteo Expose box.  ?

## 2021-08-07 NOTE — Telephone Encounter (Addendum)
Filled and in LIsa's box.  

## 2021-08-07 NOTE — Telephone Encounter (Signed)
Faxed PA form.  Decision pending.  

## 2021-08-13 NOTE — Telephone Encounter (Signed)
Resubmitted PA, key:  [id number], Rx #\: [id number].  Decision pending.  ?

## 2021-08-21 NOTE — Telephone Encounter (Signed)
Received faxed PA approval, valid 08/13/2021- 08/13/2024.  Made pharmacy aware.  ?

## 2021-08-30 ENCOUNTER — Other Ambulatory Visit: Payer: Self-pay | Admitting: Family Medicine

## 2021-09-02 NOTE — Telephone Encounter (Signed)
Refill request Zovirax ?Last refill 04/12/20 ?Last office visit 03/20/21 ?Upcoming appointment 03/24/22 ?

## 2021-09-18 ENCOUNTER — Ambulatory Visit (INDEPENDENT_AMBULATORY_CARE_PROVIDER_SITE_OTHER): Payer: 59 | Admitting: Family Medicine

## 2021-09-18 ENCOUNTER — Encounter: Payer: Self-pay | Admitting: Family Medicine

## 2021-09-18 ENCOUNTER — Ambulatory Visit (INDEPENDENT_AMBULATORY_CARE_PROVIDER_SITE_OTHER)
Admission: RE | Admit: 2021-09-18 | Discharge: 2021-09-18 | Disposition: A | Discharge status: Home / Self Care | Payer: 59 | Source: Ambulatory Visit | Attending: Family Medicine | Admitting: Family Medicine

## 2021-09-18 VITALS — BP 148/80 | HR 57 | Temp 97.9°F | Ht 67.0 in | Wt 177.0 lb

## 2021-09-18 DIAGNOSIS — J939 Pneumothorax, unspecified: Secondary | ICD-10-CM | POA: Diagnosis not present

## 2021-09-18 DIAGNOSIS — R0781 Pleurodynia: Secondary | ICD-10-CM

## 2021-09-18 DIAGNOSIS — I10 Essential (primary) hypertension: Secondary | ICD-10-CM

## 2021-09-18 MED ORDER — LOSARTAN POTASSIUM 25 MG PO TABS: 25.0000 mg | ORAL_TABLET | Freq: Every day | ORAL | 6 refills | Status: DC

## 2021-09-18 MED ORDER — TRAMADOL HCL 50 MG PO TABS: 50.0000 mg | ORAL_TABLET | Freq: Two times a day (BID) | ORAL | 0 refills | Status: DC | PRN
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
## 2021-09-18 NOTE — Patient Instructions (Addendum)
Right rib xrays today, we will be in touch with results. ?Start with tylenol 500mg  three times daily for pain control, may use tramadol 50mg  as needed for breakthrough pain.  ?Continue heating pad, ice (whichever soothes more), and carry pillow/cushion with you to act as a bufffer for cough/sneeze, etc. ? ?Your goal blood pressure is <140/90, ideally lower. ?Start losartan 25mg  daily, return in 7-10 days after starting medicine for lab visit only (you don't have to be fasting for this.  ?Work on low salt/sodium diet - goal <1.5gm (1,500mg ) per day. ?Eat a diet high in fruits/vegetables and whole grains.  Look into mediterranean and DASH diet. ?Goal activity is 132min/wk of moderate intensity exercise.  This can be split into 30 minute chunks.  If you are not at this level, you can start with smaller 10-15 min increments and slowly build up activity. ?Look at www.heart.org for more resources  ? ?DASH Eating Plan ?DASH stands for Dietary Approaches to Stop Hypertension. The DASH eating plan is a healthy eating plan that has been shown to: ?Reduce high blood pressure (hypertension). ?Reduce your risk for type 2 diabetes, heart disease, and stroke. ?Help with weight loss. ?What are tips for following this plan? ?Reading food labels ?Check food labels for the amount of salt (sodium) per serving. Choose foods with less than 5 percent of the Daily Value of sodium. Generally, foods with less than 300 milligrams (mg) of sodium per serving fit into this eating plan. ?To find whole grains, look for the word "whole" as the first word in the ingredient list. ?Shopping ?Buy products labeled as "low-sodium" or "no salt added." ?Buy fresh foods. Avoid canned foods and pre-made or frozen meals. ?Cooking ?Avoid adding salt when cooking. Use salt-free seasonings or herbs instead of table salt or sea salt. Check with your health care provider or pharmacist before using salt substitutes. ?Do not fry foods. Cook foods using healthy  methods such as baking, boiling, grilling, roasting, and broiling instead. ?Cook with heart-healthy oils, such as olive, canola, avocado, soybean, or sunflower oil. ?Meal planning ? ?Eat a balanced diet that includes: ?4 or more servings of fruits and 4 or more servings of vegetables each day. Try to fill one-half of your plate with fruits and vegetables. ?6-8 servings of whole grains each day. ?Less than 6 oz (170 g) of lean meat, poultry, or fish each day. A 3-oz (85-g) serving of meat is about the same size as a deck of cards. One egg equals 1 oz (28 g). ?2-3 servings of low-fat dairy each day. One serving is 1 cup (237 mL). ?1 serving of nuts, seeds, or beans 5 times each week. ?2-3 servings of heart-healthy fats. Healthy fats called omega-3 fatty acids are found in foods such as walnuts, flaxseeds, fortified milks, and eggs. These fats are also found in cold-water fish, such as sardines, salmon, and mackerel. ?Limit how much you eat of: ?Canned or prepackaged foods. ?Food that is high in trans fat, such as some fried foods. ?Food that is high in saturated fat, such as fatty meat. ?Desserts and other sweets, sugary drinks, and other foods with added sugar. ?Full-fat dairy products. ?Do not salt foods before eating. ?Do not eat more than 4 egg yolks a week. ?Try to eat at least 2 vegetarian meals a week. ?Eat more home-cooked food and less restaurant, buffet, and fast food. ?Lifestyle ?When eating at a restaurant, ask that your food be prepared with less salt or no salt, if possible. ?If  you drink alcohol: ?Limit how much you use to: ?0-1 drink a day for women who are not pregnant. ?0-2 drinks a day for men. ?Be aware of how much alcohol is in your drink. In the [place] one drink equals one 12 oz bottle of beer (355 mL), one 5 oz glass of wine (148 mL), or one 1? oz glass of hard liquor (44 mL). ?General information ?Avoid eating more than 2,300 mg of salt a day. If you have hypertension, you may need to reduce  your sodium intake to 1,500 mg a day. ?Work with your health care provider to maintain a healthy body weight or to lose weight. Ask what an ideal weight is for you. ?Get at least 30 minutes of exercise that causes your heart to beat faster (aerobic exercise) most days of the week. Activities may include walking, swimming, or biking. ?Work with your health care provider or dietitian to adjust your eating plan to your individual calorie needs. ?What foods should I eat? ?Fruits ?All fresh, dried, or frozen fruit. Canned fruit in natural juice (without added sugar). ?Vegetables ?Fresh or frozen vegetables (raw, steamed, roasted, or grilled). Low-sodium or reduced-sodium tomato and vegetable juice. Low-sodium or reduced-sodium tomato sauce and tomato paste. Low-sodium or reduced-sodium canned vegetables. ?Grains ?Whole-grain or whole-wheat bread. Whole-grain or whole-wheat pasta. Brown rice. Orpah Cobb. Bulgur. Whole-grain and low-sodium cereals. Pita bread. Low-fat, low-sodium crackers. Whole-wheat flour tortillas. ?Meats and other proteins ?Skinless chicken or [place]. Ground chicken or [place]. Pork with fat trimmed off. Fish and seafood. Egg whites. Dried beans, peas, or lentils. Unsalted nuts, nut butters, and seeds. Unsalted canned beans. Lean cuts of beef with fat trimmed off. Low-sodium, lean precooked or cured meat, such as sausages or meat loaves. ?Dairy ?Low-fat (1%) or fat-free (skim) milk. Reduced-fat, low-fat, or fat-free cheeses. Nonfat, low-sodium ricotta or cottage cheese. Low-fat or nonfat yogurt. Low-fat, low-sodium cheese. ?Fats and oils ?Soft margarine without trans fats. Vegetable oil. Reduced-fat, low-fat, or light mayonnaise and salad dressings (reduced-sodium). Canola, safflower, olive, avocado, soybean, and sunflower oils. Avocado. ?Seasonings and condiments ?Herbs. Spices. Seasoning mixes without salt. ?Other foods ?Unsalted popcorn and pretzels. Fat-free sweets. ?The items listed above may  not be a complete list of foods and beverages you can eat. Contact a dietitian for more information. ?What foods should I avoid? ?Fruits ?Canned fruit in a light or heavy syrup. Fried fruit. Fruit in cream or butter sauce. ?Vegetables ?Creamed or fried vegetables. Vegetables in a cheese sauce. Regular canned vegetables (not low-sodium or reduced-sodium). Regular canned tomato sauce and paste (not low-sodium or reduced-sodium). Regular tomato and vegetable juice (not low-sodium or reduced-sodium). Rosita Fire. Olives. ?Grains ?Baked goods made with fat, such as croissants, muffins, or some breads. Dry pasta or rice meal packs. ?Meats and other proteins ?Fatty cuts of meat. Ribs. Fried meat. Tomasa Blase. Bologna, salami, and other precooked or cured meats, such as sausages or meat loaves. Fat from the back of a pig (fatback). Bratwurst. Salted nuts and seeds. Canned beans with added salt. Canned or smoked fish. Whole eggs or egg yolks. Chicken or [place] with skin. ?Dairy ?Whole or 2% milk, cream, and half-and-half. Whole or full-fat cream cheese. Whole-fat or sweetened yogurt. Full-fat cheese. Nondairy creamers. Whipped toppings. Processed cheese and cheese spreads. ?Fats and oils ?Butter. Stick margarine. Lard. Shortening. Ghee. Bacon fat. Tropical oils, such as coconut, palm kernel, or palm oil. ?Seasonings and condiments ?Onion salt, garlic salt, seasoned salt, table salt, and sea salt. Worcestershire sauce. Tartar sauce. Barbecue sauce. Teriyaki  sauce. Soy sauce, including reduced-sodium. Steak sauce. Canned and packaged gravies. Fish sauce. Oyster sauce. Cocktail sauce. Store-bought horseradish. Ketchup. Mustard. Meat flavorings and tenderizers. Bouillon cubes. Hot sauces. Pre-made or packaged marinades. Pre-made or packaged taco seasonings. Relishes. Regular salad dressings. ?Other foods ?Salted popcorn and pretzels. ?The items listed above may not be a complete list of foods and beverages you should avoid. Contact a  dietitian for more information. ?Where to find more information ?National Heart, Lung, and Blood Institute: PopSteam.iswww.nhlbi.nih.gov ?American Heart Association: www.heart.org ?Academy of Nutrition and Dietetics: www.ea
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
## 2021-09-18 NOTE — Assessment & Plan Note (Signed)
Blood pressure remaining elevated on recheck today.  He has had several elevated readings over the past few months, both in medical office and at home.  Recommend starting low-dose losartan 25 mg daily, scheduling lab visit 1 week after starting to recheck kidney function. ?Also reviewed lifestyle changes to support good blood pressure control including low sodium/salt diet, regular exercise routine, and DASH diet handout provided. ?

## 2021-09-19 ENCOUNTER — Telehealth: Payer: Self-pay | Admitting: Internal Medicine

## 2021-09-19 NOTE — Telephone Encounter (Signed)
Phone  Nurseline reported right rib xray/cxr with right side 5% pneumothorax  LMOM - pt not answering - pt to go to ED or call 911 immediately for any worsening sob/pain  Cathlean Cower MD

## 2021-09-20 ENCOUNTER — Other Ambulatory Visit: Payer: Self-pay | Admitting: Family Medicine

## 2021-09-20 DIAGNOSIS — J939 Pneumothorax, unspecified: Secondary | ICD-10-CM

## 2021-09-20 NOTE — Telephone Encounter (Signed)
[facility] Night - Client TELEPHONE ADVICE RECORD [facility] Patient Name: Andrew Hart OLD Gender: Male DOB: 1970-09-21 Age: 51 Y 7 M 17 D Return Phone Number: [phone] (Primary) Address: City/ State/ Zip: [place] Client [facility] Night - Client Client Site [facility] - Night Provider Eustaquio Boyden - MD Contact Type Call Who Is Calling Lab / Radiology Lab Name [facility] Lab Phone Number [phone] Lab Tech Name [facility] Chief Complaint Lab Result (Critical or Stat) Call Type Lab Send to RN Reason for Call Report lab results Initial Comment Caller, Stacie with [facility], needs to get critical findings to someone about an x-ray that was ordered by Dr. Sharen Hones. Translation No Nurse Assessment Nurse: Su Hilt, RN, Traci Date/Time (Eastern Time): 09/19/2021 5:09:15 PM Is there an on-call provider listed? ---Yes Please list name of person reporting value (Lab Employee) and a contact number. ---Dr. Sharlet Salina [phone] Please document the following items: Lab name Lab value (read back to lab to verify) Reference range for lab value Date and time blood was drawn Collect time of birth for bilirubin results ---X-Ray Result: Rib series, small right apical pneumothorax less than 5% volume and no tension effect Performed on 09/19/2021 at 12:59pm Please collect the patient contact information from the lab. (name, phone number and address) ---Franco Nones, [phone] Disp. Time Lamount Cohen Time) Disposition Final User 09/19/2021 5:12:49 PM Called On-Call Provider Su Hilt, RN, Traci 09/19/2021 5:14:04 PM Clinical Call Yes Su Hilt, RN, Traci Paging DoctorName Phone DateTime Result/ Outcome Message Type Notes Oliver Barre- MD [phone] 09/19/2021 5:12:49 PM Called On Call Provider - Reached Doctor Paged PLEASE NOTE: All timestamps contained within this report are represented  as [place] Standard Time. CONFIDENTIALTY NOTICE: This fax transmission is intended only for the addressee. It contains information that is legally privileged, confidential or otherwise protected from use or disclosure. If you are not the intended recipient, you are strictly prohibited from reviewing, disclosing, copying using or disseminating any of this information or taking any action in reliance on or regarding this information. If you have received this fax in error, please notify us immediately by telephone so that we can arrange for its return to [place]. Phone: [phone], Toll-Free: [phone], Fax: [phone] Page: 2 of 2 Call Id: [id number] Paging DoctorName Phone DateTime Result/ Outcome Message Type Notes Oliver Barre- MD 09/19/2021 5:13:56 PM Spoke with On Call - General Message Result Results reported to on call, no further orders g

## 2021-09-20 NOTE — Telephone Encounter (Signed)
Per chart review Dr Reece Agar has order CXR. Sending note to Dr Nila Nephew CMA and teams to [place].

## 2021-09-20 NOTE — Telephone Encounter (Signed)
I spoke with patient this morning - see result note.  He is slowly recovering Rpt CXR scheduled for next week.

## 2021-09-25 ENCOUNTER — Telehealth: Payer: Self-pay | Admitting: Family Medicine

## 2021-09-25 ENCOUNTER — Other Ambulatory Visit (INDEPENDENT_AMBULATORY_CARE_PROVIDER_SITE_OTHER): Payer: 59

## 2021-09-25 ENCOUNTER — Ambulatory Visit (INDEPENDENT_AMBULATORY_CARE_PROVIDER_SITE_OTHER)
Admission: RE | Admit: 2021-09-25 | Discharge: 2021-09-25 | Disposition: A | Discharge status: Home / Self Care | Payer: 59 | Source: Ambulatory Visit | Attending: Family Medicine | Admitting: Family Medicine

## 2021-09-25 DIAGNOSIS — I10 Essential (primary) hypertension: Secondary | ICD-10-CM | POA: Diagnosis not present

## 2021-09-25 DIAGNOSIS — J939 Pneumothorax, unspecified: Secondary | ICD-10-CM

## 2021-09-25 LAB — BASIC METABOLIC PANEL
BUN: 24 mg/dL — ABNORMAL HIGH (ref 6–23)
CO2: 28 mEq/L (ref 19–32)
Calcium: 9.3 mg/dL (ref 8.4–10.5)
Chloride: 104 mEq/L (ref 96–112)
Creatinine, Ser: 1.18 mg/dL (ref 0.40–1.50)
GFR: 71.38 mL/min (ref >60.00)
Glucose, Bld: 133 mg/dL — ABNORMAL HIGH (ref 70–99)
Potassium: 4.2 mEq/L (ref 3.5–5.1)
Sodium: 140 mEq/L (ref 135–145)

## 2021-09-25 MED ORDER — HYDROCODONE-ACETAMINOPHEN 5-325 MG PO TABS: 1.0000 | ORAL_TABLET | Freq: Three times a day (TID) | ORAL | 0 refills | Status: DC | PRN

## 2021-09-25 NOTE — Telephone Encounter (Signed)
Spoke with patient at lab visit/CXR visit today. Lungs largely clear with good air movementthroughout  Tramadol not effectively managing pain. Will Rx hydrocodone. Discussed limiting activity until continued healing.  Will await CXR results.  [place] CSRS reviewed.

## 2021-09-27 NOTE — Telephone Encounter (Signed)
Prior auth started for HYDROcodone-Acetaminophen 5-325MG  tablets. HOLGER SOKOLOWSKI Key: [id number] - Rx #: [id number] Waiting for determination.

## 2021-10-01 ENCOUNTER — Other Ambulatory Visit (INDEPENDENT_AMBULATORY_CARE_PROVIDER_SITE_OTHER): Payer: 59

## 2021-10-01 DIAGNOSIS — E1165 Type 2 diabetes mellitus with hyperglycemia: Secondary | ICD-10-CM

## 2021-10-01 LAB — BASIC METABOLIC PANEL
BUN: 21 mg/dL (ref 6–23)
CO2: 26 mEq/L (ref 19–32)
Calcium: 9.4 mg/dL (ref 8.4–10.5)
Chloride: 105 mEq/L (ref 96–112)
Creatinine, Ser: 0.98 mg/dL (ref 0.40–1.50)
GFR: 89.19 mL/min (ref >60.00)
Glucose, Bld: 135 mg/dL — ABNORMAL HIGH (ref 70–99)
Potassium: 4.4 mEq/L (ref 3.5–5.1)
Sodium: 139 mEq/L (ref 135–145)

## 2021-10-01 LAB — HEMOGLOBIN A1C: Hgb A1c MFr Bld: 7.8 % — ABNORMAL HIGH (ref 4.6–6.5)

## 2021-10-01 NOTE — Telephone Encounter (Addendum)
He does have moderate to severe acute pain that requires treatment with an opioid for more than seven days.  If insurance doesn't cover this, he will have to pay out of pocket.

## 2021-10-01 NOTE — Telephone Encounter (Signed)
Prior auth denied for HYDROcodone-Acetaminophen 5-325MG  tablets PAARTH CROPPER Key: [id number] - PA Case ID: [id number] - Rx #: [id number]  You do not meet the requirements of your plan. Your plan covers this drug when you have any of these conditions: - Pain due to cancer, sickle cell disease, or a terminal condition - Pain being managed through hospice or palliative care - Moderate to severe chronic pain that requires treatment with an opioid - Moderate to severe acute pain that requires treatment with an opioid for more than seven days
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
## 2021-10-03 ENCOUNTER — Telehealth: Payer: Self-pay

## 2021-10-03 ENCOUNTER — Ambulatory Visit: Payer: 59 | Admitting: Endocrinology

## 2021-10-03 ENCOUNTER — Encounter: Payer: Self-pay | Admitting: Endocrinology

## 2021-10-03 VITALS — BP 156/92 | HR 68 | Ht 67.0 in | Wt 174.4 lb

## 2021-10-03 DIAGNOSIS — E1165 Type 2 diabetes mellitus with hyperglycemia: Secondary | ICD-10-CM | POA: Diagnosis not present

## 2021-10-03 MED ORDER — FREESTYLE LIBRE 3 SENSOR MISC: 1.0000 | 2 refills | Status: DC

## 2021-10-03 NOTE — Patient Instructions (Signed)
Check blood sugars on waking up   ? ?Also check blood sugars about 2 hours after meals and do this after different meals by rotation ? ?Recommended blood sugar levels on waking up are 90-130 and about 2 hours after meal is 130-160 ?  ? ?

## 2021-10-03 NOTE — Progress Notes (Signed)
Patient ID: Andrew Hart, male   DOB: 03/24/1971, 51 y.o.   MRN: [id number]           Reason for Appointment: Follow-up for Type 2 Diabetes    History of Present Illness:          Date of diagnosis of type 2 diabetes mellitus: 2007 ?        Background history:  He was diagnosed with routine screening lab from his work He was initially tried on metformin but this caused diarrhea and he could not continue this Subsequently was treated with glipizide and apparently this was able to control his blood sugars fairly well for a few years Probably in early 2016 he was given Actos in addition to help his glucose control as A1c had gone up to 9.4 This helped transiently and A1c was 7.7 However his A1c had been otherwise over 8% since mid 2015 Because of  high blood sugars on his initial consultation in 05/2014 (A1c was 9.1) he was started on Invokana and Onglyza in addition to his Actos and glipizide.   Recent history:   Non-insulin hypoglycemic drugs: glipizide ER 10 mg daily, Actos 45 mg daily, Farxiga 10 mg daily  Insulin dose: Basaglar 20 units at bedtime  His A1c has fluctuated between 7.4 and 9.1, last higher at 7.8 Lowest A1c on record 6.9  Fructosamine last 315   Current blood sugar patterns and problems identified: He has gone opposite insulin or another 4 units since his last visit  However even though he is taking a higher dose he has forgotten to take it several times He says that this is because of falling asleep before he remembers to take it at bedtime  Also has been very lax with checking blood sugars and only has 3 readings in his last month  Last 2 weeks or so he has not been active because of rib fracture  Has slight weight loss  Has been taking his 3 oral medications quite regularly  Most of his exercise is related to his work activity       Side effects from medications have been: Diarrhea from regular metformin, nausea and constipation from Rybelsus, nausea from  Trulicity, nausea and body aches from Mounjaro   Glucose monitoring:  Done once a day or less       Glucometer: 1- Touch   Blood Glucose readings  161, 136 fasting and 237 after breakfast  Previously  PRE-MEAL Fasting Lunch Dinner Bedtime Overall  Glucose range: 79-200      Mean/median: 140    143   POST-MEAL PC Breakfast PC Lunch PC Dinner  Glucose range:   ?  Mean/median:       Dietician visit, most recent: 06/2015  Weight history:   Wt Readings from Last 3 Encounters:  10/03/21 174 lb 6.4 oz (79.1 kg)  09/18/21 177 lb (80.3 kg)  07/29/21 177 lb 12.8 oz (80.6 kg)    Glycemic control:    Lab Results  Component Value Date   HGBA1C 7.8 (H) 10/01/2021   HGBA1C 7.6 (A) 04/23/2021   HGBA1C 7.4 (H) 01/17/2021   Lab Results  Component Value Date   MICROALBUR <0.7 01/17/2021   LDLCALC 71 01/17/2021   CREATININE 0.98 10/01/2021    Lab on 10/01/2021  Component Date Value Ref Range Status   Sodium 10/01/2021 139  135 - 145 mEq/L Final   Potassium 10/01/2021 4.4  3.5 - 5.1 mEq/L Final   Chloride 10/01/2021 105  96 - 112 mEq/L Final   CO2 10/01/2021 26  19 - 32 mEq/L Final   Glucose, Bld 10/01/2021 135 (H)  70 - 99 mg/dL Final   BUN 10/01/2021 21  6 - 23 mg/dL Final   Creatinine, Ser 10/01/2021 0.98  0.40 - 1.50 mg/dL Final   GFR 10/01/2021 89.19  >60.00 mL/min Final   Calculated using the CKD-EPI Creatinine Equation (2021)   Calcium 10/01/2021 9.4  8.4 - 10.5 mg/dL Final   Hgb A1c MFr Bld 10/01/2021 7.8 (H)  4.6 - 6.5 % Final   Glycemic Control Guidelines for People with Diabetes:Non Diabetic:  <6%Goal of Therapy: <7%Additional Action Suggested:  >8%       Allergies as of 10/03/2021       Reactions   Janumet Xr [sitagliptin-metformin Hcl Er] Rash   Tolerates the plain Januvia    Jardiance [empagliflozin] Rash   Metformin And Related Diarrhea   Methotrexate Derivatives Other (See Comments)   transaminitis   Other Rash   Methyldibromo Gluteronitrile - skin  product preservative        Medication List        Accurate as of October 03, 2021  1:09 PM. If you have any questions, ask your nurse or doctor.          acyclovir 200 MG capsule Commonly known as: ZOVIRAX TAKE 1 CAPSULE BY MOUTH 3 TIMES DAILY FOR 3 TO 5 DAYS AS NEEDED.   aspirin EC 81 MG tablet Take 1 tablet (81 mg total) by mouth every Monday, Wednesday, and Friday.   B-12 1000 MCG Subl Place 1 tablet under the tongue daily.   Basaglar KwikPen 100 UNIT/ML Start with 6 units, adjust up to 20 units at night daily as directed   [employer] Lancets lancets Use as instructed to check blood sugar twice a day dx E11.65   busPIRone 15 MG tablet Commonly known as: BUSPAR Take 1 tablet (15 mg total) by mouth 2 (two) times daily.   cholecalciferol 25 MCG (1000 UNIT) tablet Commonly known as: VITAMIN D3 Take 1,000 Units by mouth daily.   citalopram 20 MG tablet Commonly known as: CELEXA Take 1 tablet (20 mg total) by mouth daily.   Contour Next Monitor w/Device Kit CHECK BLOOD SUGAR TWICE DAILY   dapagliflozin propanediol 10 MG Tabs tablet Commonly known as: Farxiga Take 1 tablet (10 mg total) by mouth daily before breakfast.   dupilumab 300 MG/2ML prefilled syringe Commonly known as: DUPIXENT Inject 300 mg into the skin every 14 (fourteen) days.   FreeStyle Libre 3 Sensor Misc 1 Device by Does not apply route every 14 (fourteen) days. Apply 1 sensor on upper arm every 14 days for continuous glucose monitoring Started by: Elayne Snare, MD   glipiZIDE 10 MG 24 hr tablet Commonly known as: GLUCOTROL XL TAKE 2 TABLETS (20 MG TOTAL) BY MOUTH DAILY WITH BREAKFAST.   halobetasol 0.05 % cream Commonly known as: ULTRAVATE Apply 1 application topically 2 (two) times daily. Can cover with Saran wrap fo r30 min.  DO NOT apply to face/groin.   HYDROcodone-acetaminophen 5-325 MG tablet Commonly known as: NORCO/VICODIN Take 1 tablet by mouth 3 (three) times daily as needed  for moderate pain (sedation precautions).   hydrOXYzine 25 MG tablet Commonly known as: ATARAX Take 25 mg by mouth 3 (three) times daily as needed for itching.   ibuprofen 200 MG tablet Commonly known as: ADVIL Take 400 mg by mouth every 6 (six) hours as needed for headache  or moderate pain.   liraglutide 18 MG/3ML Sopn Commonly known as: VICTOZA Inject 0.6 mg into the skin daily.   losartan 25 MG tablet Commonly known as: COZAAR Take 1 tablet (25 mg total) by mouth daily.   OneTouch Verio test strip Generic drug: glucose blood Use as instructed   pantoprazole 40 MG tablet Commonly known as: PROTONIX Take 1 tablet (40 mg total) by mouth daily.   Pen Needles 32G X 4 MM Misc Use to inject victoza into skin   pioglitazone 45 MG tablet Commonly known as: ACTOS TAKE 1 TABLET BY MOUTH DAILY.   simvastatin 80 MG tablet Commonly known as: ZOCOR TAKE 1 TABLET BY MOUTH DAILY.   traZODone 150 MG tablet Commonly known as: DESYREL TAKE 1/2 TABLET (75 MG TOTAL) BY MOUTH AT BEDTIME.        Allergies:  Allergies  Allergen Reactions   Janumet Xr [Sitagliptin-Metformin Hcl Er] Rash    Tolerates the plain Januvia    Jardiance [Empagliflozin] Rash   Metformin And Related Diarrhea   Methotrexate Derivatives Other (See Comments)    transaminitis   Other Rash    Methyldibromo Gluteronitrile - skin product preservative    Past Medical History:  Diagnosis Date   Alcohol use 11/08/2015   Anxiety    Aortic aneurysm (HCC)    Diabetes mellitus without complication ([place]) 9509   Andrew Hart   GERD (gastroesophageal reflux disease)    Herpes labialis    History of chicken pox    Hyperlipidemia    Kidney stone     Past Surgical History:  Procedure Laterality Date   ABDOMINAL AORTOGRAM W/LOWER EXTREMITY N/A 07/22/2019   Procedure: ABDOMINAL AORTOGRAM W/LOWER EXTREMITY;  Surgeon: Elam Dutch, MD;  Location: [facility];  Service: Cardiovascular;  Laterality: N/A;    APPENDECTOMY     FINGER SURGERY Right    2 screws in ring finger   PERIPHERAL VASCULAR INTERVENTION Left 07/22/2019   Procedure: PERIPHERAL VASCULAR INTERVENTION;  Surgeon: Elam Dutch, MD;  Location: [facility];  Service: Cardiovascular;  Laterality: Left;   TONSILLECTOMY      Family History  Problem Relation Age of Onset   Diabetes Mother    Parkinsonism Mother 46       progressive supranuclear palsy, deceased   CAD Father 77       MI at 63, CABG at 20   Diabetes Maternal Grandmother    Stroke Neg Hx    Cancer Neg Hx    Colon cancer Neg Hx    Esophageal cancer Neg Hx    Rectal cancer Neg Hx    Stomach cancer Neg Hx     Social History:  reports that he quit smoking about 2 years ago. His smoking use included cigarettes. He has a 7.50 pack-year smoking history. He has never used smokeless tobacco. He reports that he does not currently use alcohol. He reports that he does not currently use drugs.    Review of Systems    Lipid history:  Has been on 80 mg simvastatin for treatment, prescribed by PCP  His lipids are generally well controlled    Lab Results  Component Value Date   CHOL 131 01/17/2021   HDL 43.30 01/17/2021   LDLCALC 71 01/17/2021   TRIG 81.0 01/17/2021   CHOLHDL 3 01/17/2021         Lab Results  Component Value Date   ALT 20 01/17/2021    He has been refusing to take  the Covid vaccine    He has been told to have atopic dermatitis by dermatologist and is on treatment  No history of hypertension, blood pressure is usually borderline  BP Readings from Last 3 Encounters:  10/03/21 (!) 156/92  09/18/21 (!) 148/80  07/29/21 (!) 150/86     Review of Systems  Most recent foot exam: 7/21  No numbness or tingling in his feet    Physical Examination:  BP (!) 156/92   Pulse 68   Ht '5\' 7"'  (1.702 m)   Wt 174 lb 6.4 oz (79.1 kg)   SpO2 97%   BMI 27.31 kg/m    ASSESSMENT:  Diabetes type 2, non-insulin-dependent  See history  of present illness for detailed discussion of current diabetes management, blood sugar patterns and problems identified  His A1c is last 7.6  He is on glipizide ER 20 mg, Farxiga, basal insulin and Actos now  As above with inconsistent insulin regimen, inadequate monitoring and recent injury his blood sugars are not controlled  HYPERTENSION: His blood pressure is again high even though he is taking losartan but only 25 mg from his PCP   PLAN:    Take insulin every evening regularly and try to take it at suppertime with for better compliance Continue to adjust this every 3 to 4 days if fasting readings are consistently over 130 Start using FREESTYLE libre 3 to help monitor blood sugar more closely and for convenience Discussed how this works, benefits and general guidelines Patient information given  He will need to likely adjust his diet better with using continuous glucose monitoring Discussed possibility of taking mealtime insulin if his postprandial readings are Try to exercise on the days he is not very active  He will discuss increasing his losartan with PCP  Patient Instructions  Check blood sugars on waking up    Also check blood sugars about 2 hours after meals and do this after different meals by rotation  Recommended blood sugar levels on waking up are 90-130 and about 2 hours after meal is 130-160     Elayne Snare 10/03/2021, 1:09 PM   Note: This office note was prepared with Dragon voice recognition system technology. Any transcriptional errors that result from this process are unintentional.

## 2021-10-03 NOTE — Telephone Encounter (Signed)
Prior Andrew Hart has been approved for HYDROcodone-Acetaminophen 5-325MG  tablets. Franco Nones KeyYevonne Pax - PA Case ID: [id number]  Your PA request has been approved. Additional information will be provided in the approval communication.

## 2021-10-03 NOTE — Telephone Encounter (Signed)
Patient was prescribed new blood pressure medication, couldn't remember the name of it but states he believes it isnt working and wants to know what he should do. Blood pressure readings have been 148/90 and then 158/105.

## 2021-10-03 NOTE — Telephone Encounter (Signed)
Noted  

## 2021-10-04 MED ORDER — LOSARTAN POTASSIUM 50 MG PO TABS: 50.0000 mg | ORAL_TABLET | Freq: Every day | ORAL | 6 refills | Status: DC

## 2021-10-04 NOTE — Telephone Encounter (Signed)
Patient notified as instructed by telephone and verbalized understanding. 

## 2021-10-04 NOTE — Telephone Encounter (Signed)
Losartan 25mg  was started 2 weeks ago.  If blood pressure staying high he may increase to 2 pills daily, update with effect.  I also went ahead and sent in 50 mg tablets to take 1 daily when he runs out.

## 2021-10-04 NOTE — Telephone Encounter (Signed)
Left message on voicemail to call the office back. Need more information.

## 2021-10-05 ENCOUNTER — Other Ambulatory Visit: Payer: Self-pay

## 2021-10-05 DIAGNOSIS — I700 Atherosclerosis of aorta: Secondary | ICD-10-CM

## 2021-10-05 DIAGNOSIS — I739 Peripheral vascular disease, unspecified: Secondary | ICD-10-CM
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
## 2021-10-11 ENCOUNTER — Ambulatory Visit: Payer: 59 | Admitting: Physician Assistant

## 2021-10-11 ENCOUNTER — Ambulatory Visit (INDEPENDENT_AMBULATORY_CARE_PROVIDER_SITE_OTHER)
Admission: RE | Admit: 2021-10-11 | Discharge: 2021-10-11 | Disposition: A | Discharge status: Home / Self Care | Payer: 59 | Source: Ambulatory Visit | Attending: Vascular Surgery | Admitting: Vascular Surgery

## 2021-10-11 ENCOUNTER — Ambulatory Visit (HOSPITAL_COMMUNITY)
Admission: RE | Admit: 2021-10-11 | Discharge: 2021-10-11 | Disposition: A | Discharge status: Home / Self Care | Payer: 59 | Source: Ambulatory Visit | Attending: Vascular Surgery | Admitting: Vascular Surgery

## 2021-10-11 VITALS — BP 120/79 | HR 40 | Temp 97.2°F | Resp 20 | Ht 67.0 in | Wt 179.9 lb

## 2021-10-11 DIAGNOSIS — I700 Atherosclerosis of aorta: Secondary | ICD-10-CM | POA: Insufficient documentation

## 2021-10-11 DIAGNOSIS — I739 Peripheral vascular disease, unspecified: Secondary | ICD-10-CM

## 2021-10-11 DIAGNOSIS — I708 Atherosclerosis of other arteries: Secondary | ICD-10-CM

## 2021-10-11 DIAGNOSIS — I714 Abdominal aortic aneurysm, without rupture, unspecified: Secondary | ICD-10-CM

## 2021-10-29 ENCOUNTER — Telehealth: Payer: Self-pay

## 2021-10-29 ENCOUNTER — Other Ambulatory Visit (HOSPITAL_COMMUNITY): Payer: Self-pay

## 2021-10-29 DIAGNOSIS — E1165 Type 2 diabetes mellitus with hyperglycemia: Secondary | ICD-10-CM

## 2021-10-30 MED ORDER — DEXCOM G7 SENSOR MISC: 3 refills | Status: DC

## 2021-10-30 NOTE — Telephone Encounter (Signed)
Rx sent and I called and notified patient. He will pickup from pharmacy

## 2021-10-30 NOTE — Addendum Note (Signed)
Addended by: Eliseo Squires on: 10/30/2021 02:22 PM   Modules accepted: Orders

## 2021-11-04 ENCOUNTER — Other Ambulatory Visit: Payer: Self-pay | Admitting: Endocrinology

## 2021-12-23 ENCOUNTER — Other Ambulatory Visit (INDEPENDENT_AMBULATORY_CARE_PROVIDER_SITE_OTHER): Payer: 59

## 2021-12-23 DIAGNOSIS — E1165 Type 2 diabetes mellitus with hyperglycemia: Secondary | ICD-10-CM

## 2021-12-23 LAB — MICROALBUMIN / CREATININE URINE RATIO
Creatinine,U: 90.1 mg/dL
Microalb Creat Ratio: 0.8 mg/g (ref 0.0–30.0)
Microalb, Ur: <0.7 mg/dL (ref 0.0–1.9)

## 2021-12-23 LAB — HEMOGLOBIN A1C: Hgb A1c MFr Bld: 7.2 % — ABNORMAL HIGH (ref 4.6–6.5)

## 2021-12-23 LAB — COMPREHENSIVE METABOLIC PANEL
ALT: 17 U/L (ref 0–53)
AST: 17 U/L (ref 0–37)
Albumin: 4.3 g/dL (ref 3.5–5.2)
Alkaline Phosphatase: 73 U/L (ref 39–117)
BUN: 18 mg/dL (ref 6–23)
CO2: 29 mEq/L (ref 19–32)
Calcium: 8.8 mg/dL (ref 8.4–10.5)
Chloride: 103 mEq/L (ref 96–112)
Creatinine, Ser: 1.05 mg/dL (ref 0.40–1.50)
GFR: 81.97 mL/min (ref >60.00)
Glucose, Bld: 163 mg/dL — ABNORMAL HIGH (ref 70–99)
Potassium: 4.5 mEq/L (ref 3.5–5.1)
Sodium: 139 mEq/L (ref 135–145)
Total Bilirubin: 0.7 mg/dL (ref 0.2–1.2)
Total Protein: 6.4 g/dL (ref 6.0–8.3)

## 2021-12-25 DIAGNOSIS — M9903 Segmental and somatic dysfunction of lumbar region: Secondary | ICD-10-CM | POA: Diagnosis not present

## 2021-12-25 DIAGNOSIS — M9904 Segmental and somatic dysfunction of sacral region: Secondary | ICD-10-CM | POA: Diagnosis not present

## 2021-12-25 DIAGNOSIS — M9901 Segmental and somatic dysfunction of cervical region: Secondary | ICD-10-CM | POA: Diagnosis not present

## 2021-12-25 DIAGNOSIS — M9902 Segmental and somatic dysfunction of thoracic region: Secondary | ICD-10-CM | POA: Diagnosis not present

## 2021-12-26 ENCOUNTER — Encounter: Payer: Self-pay | Admitting: Endocrinology

## 2021-12-26 ENCOUNTER — Ambulatory Visit: Payer: 59 | Admitting: Endocrinology

## 2021-12-26 VITALS — BP 122/74 | HR 51 | Ht 67.0 in | Wt 181.2 lb

## 2021-12-26 DIAGNOSIS — E1169 Type 2 diabetes mellitus with other specified complication: Secondary | ICD-10-CM | POA: Diagnosis not present

## 2021-12-26 DIAGNOSIS — Z794 Long term (current) use of insulin: Secondary | ICD-10-CM

## 2021-12-26 DIAGNOSIS — E1165 Type 2 diabetes mellitus with hyperglycemia: Secondary | ICD-10-CM | POA: Diagnosis not present

## 2021-12-26 DIAGNOSIS — I10 Essential (primary) hypertension: Secondary | ICD-10-CM

## 2021-12-26 DIAGNOSIS — E785 Hyperlipidemia, unspecified: Secondary | ICD-10-CM

## 2021-12-26 MED ORDER — GLIPIZIDE ER 5 MG PO TB24: 5.0000 mg | ORAL_TABLET | Freq: Every day | ORAL | 1 refills | Status: DC

## 2021-12-26 NOTE — Patient Instructions (Signed)
No sweet drinks  Check blood sugars on waking up   Also check blood sugars about 2 hours after meals and do this after different meals by rotation  Recommended blood sugar levels on waking up are 90-130 and about 2 hours after meal is 130-160  Please bring your blood sugar monitor to each visit, thank you

## 2021-12-26 NOTE — Progress Notes (Signed)
Patient ID: Andrew Hart, male   DOB: [birth year], 51 y.o.   MRN: [id number]           Reason for Appointment: Follow-up for Type 2 Diabetes    History of Present Illness:          Date of diagnosis of type 2 diabetes mellitus: 2007 ?        Background history:  He was diagnosed with routine screening lab from his work He was initially tried on metformin but this caused diarrhea and he could not continue this Subsequently was treated with glipizide and apparently this was able to control his blood sugars fairly well for a few years Probably in early 2016 he was given Actos in addition to help his glucose control as A1c had gone up to 9.4 This helped transiently and A1c was 7.7 However his A1c had been otherwise over 8% since mid 2015 Because of  high blood sugars on his initial consultation in 05/2014 (A1c was 9.1) he was started on Invokana and Onglyza in addition to his Actos and glipizide.   Recent history:   Non-insulin hypoglycemic drugs: glipizide ER 10 mg daily, Actos 45 mg daily, Farxiga 10 mg daily  Insulin dose: Basaglar 20 units at bedtime  His A1c has improved to 7.2 from 7.8  Lowest A1c on record 6.9  Fructosamine last 315   Current blood sugar patterns and problems identified: He has gone on the Dexcom sensor to monitor his blood sugar which is being covered now  Previously was not checking blood sugars much He has fairly significant postprandial hyperglycemia at some meals but not consistently Most of his high sugars he thinks is from drinking regular soft drinks or sweet tea and occasionally from eating out  However blood sugars are variable overnight and may be getting mild hypoglycemia at times overnight also  He continues to take his insulin unchanged  Weight has gone up slightly by 7 pounds  This is despite being trying to be more active especially at work  Lab glucose was 163 late morning       Side effects from medications have been: Diarrhea from  regular metformin, nausea and constipation from Rybelsus, nausea from Trulicity, nausea and body aches from [facility]   Blood sugars are generally in fairly good overnight but tend to be significantly higher at times after meals especially lunch Does have moderate fluctuation at all times but more after meals with standard deviation 58 HYPERGLYCEMIC episodes most frequently occurring in the mid to late afternoons and periodically late evening  HIGHEST blood sugars on an average are between about 4-5 PM  Overnight sugars are fairly good overall but fluctuate significantly especially with starting of higher closer to midnight and then improving along with low normal or falsely low readings on 3 of the nights for a few hours Most postprandial readings are spiking significantly after lunch and may be over 250 at times Average blood sugar at the highest point in the afternoon is 206 After dinner blood sugars are also periodically higher but not as consistently as after lunch Noted to hypoglycemia seen with some low normal or slightly low readings overnight possibly from artifacts  CGM use % of time 93  2-week average/GV 150/37  Time in range       69%  % Time Above 180 21  % Time above 250 8  % Time Below 70 1      Blood Glucose readings previously  161, 136 fasting  and 237 after breakfast    Dietician visit, most recent: 06/2015  Weight history:   Wt Readings from Last 3 Encounters:  12/26/21 181 lb 3.2 oz (82.2 kg)  10/11/21 179 lb 14.4 oz (81.6 kg)  10/03/21 174 lb 6.4 oz (79.1 kg)    Glycemic control:    Lab Results  Component Value Date   HGBA1C 7.2 (H) 12/23/2021   HGBA1C 7.8 (H) 10/01/2021   HGBA1C 7.6 (A) 04/23/2021   Lab Results  Component Value Date   MICROALBUR <0.7 12/23/2021   LDLCALC 71 01/17/2021   CREATININE 1.05 12/23/2021    Lab on 12/23/2021  Component Date Value Ref Range Status   Microalb, Ur 12/23/2021 <0.7  0.0 - 1.9 mg/dL Final   Creatinine,U  12/23/2021 90.1  mg/dL Final   Microalb Creat Ratio 12/23/2021 0.8  0.0 - 30.0 mg/g Final   Sodium 12/23/2021 139  135 - 145 mEq/L Final   Potassium 12/23/2021 4.5  3.5 - 5.1 mEq/L Final   Chloride 12/23/2021 103  96 - 112 mEq/L Final   CO2 12/23/2021 29  19 - 32 mEq/L Final   Glucose, Bld 12/23/2021 163 (H)  70 - 99 mg/dL Final   BUN 12/23/2021 18  6 - 23 mg/dL Final   Creatinine, Ser 12/23/2021 1.05  0.40 - 1.50 mg/dL Final   Total Bilirubin 12/23/2021 0.7  0.2 - 1.2 mg/dL Final   Alkaline Phosphatase 12/23/2021 73  39 - 117 U/L Final   AST 12/23/2021 17  0 - 37 U/L Final   ALT 12/23/2021 17  0 - 53 U/L Final   Total Protein 12/23/2021 6.4  6.0 - 8.3 g/dL Final   Albumin 12/23/2021 4.3  3.5 - 5.2 g/dL Final   GFR 12/23/2021 81.97  >60.00 mL/min Final   Calculated using the CKD-EPI Creatinine Equation (2021)   Calcium 12/23/2021 8.8  8.4 - 10.5 mg/dL Final   Hgb A1c MFr Bld 12/23/2021 7.2 (H)  4.6 - 6.5 % Final   Glycemic Control Guidelines for People with Diabetes:Non Diabetic:  <6%Goal of Therapy: <7%Additional Action Suggested:  >8%       Allergies as of 12/26/2021       Reactions   Janumet Xr [sitagliptin-metformin Hcl Er] Rash   Tolerates the plain Januvia    Jardiance [empagliflozin] Rash   Metformin And Related Diarrhea   Methotrexate Derivatives Other (See Comments)   transaminitis   Other Rash   Methyldibromo Gluteronitrile - skin product preservative        Medication List        Accurate as of December 26, 2021 11:25 AM. If you have any questions, ask your nurse or doctor.          STOP taking these medications    liraglutide 18 MG/3ML Sopn Commonly known as: VICTOZA Stopped by: Elayne Snare, MD       TAKE these medications    acyclovir 200 MG capsule Commonly known as: ZOVIRAX TAKE 1 CAPSULE BY MOUTH 3 TIMES DAILY FOR 3 TO 5 DAYS AS NEEDED.   aspirin EC 81 MG tablet Take 1 tablet (81 mg total) by mouth every Monday, Wednesday, and Friday.    B-12 1000 MCG Subl Place 1 tablet under the tongue daily.   Basaglar KwikPen 100 UNIT/ML Start with 6 units, adjust up to 20 units at night daily as directed   [employer] Lancets lancets Use as instructed to check blood sugar twice a day dx E11.65   busPIRone 15  MG tablet Commonly known as: BUSPAR Take 1 tablet (15 mg total) by mouth 2 (two) times daily.   cholecalciferol 25 MCG (1000 UNIT) tablet Commonly known as: VITAMIN D3 Take 1,000 Units by mouth daily.   citalopram 20 MG tablet Commonly known as: CELEXA Take 1 tablet (20 mg total) by mouth daily.   Contour Next Monitor w/Device Kit CHECK BLOOD SUGAR TWICE DAILY   dapagliflozin propanediol 10 MG Tabs tablet Commonly known as: Farxiga Take 1 tablet (10 mg total) by mouth daily before breakfast.   Dexcom G7 Sensor Misc Change every 10 days   dupilumab 300 MG/2ML prefilled syringe Commonly known as: DUPIXENT Inject 300 mg into the skin every 14 (fourteen) days.   glipiZIDE 5 MG 24 hr tablet Commonly known as: GLUCOTROL XL Take 1 tablet (5 mg total) by mouth daily with breakfast. What changed:  medication strength how much to take Changed by: Elayne Snare, MD   halobetasol 0.05 % cream Commonly known as: ULTRAVATE Apply 1 application topically 2 (two) times daily. Can cover with Saran wrap fo r30 min.  DO NOT apply to face/groin.   HYDROcodone-acetaminophen 5-325 MG tablet Commonly known as: NORCO/VICODIN Take 1 tablet by mouth 3 (three) times daily as needed for moderate pain (sedation precautions).   hydrOXYzine 25 MG tablet Commonly known as: ATARAX Take 25 mg by mouth 3 (three) times daily as needed for itching.   ibuprofen 200 MG tablet Commonly known as: ADVIL Take 400 mg by mouth every 6 (six) hours as needed for headache or moderate pain.   losartan 50 MG tablet Commonly known as: COZAAR Take 1 tablet (50 mg total) by mouth daily.   OneTouch Verio test strip Generic drug: glucose  blood Use as instructed   pantoprazole 40 MG tablet Commonly known as: PROTONIX Take 1 tablet (40 mg total) by mouth daily.   Pen Needles 32G X 4 MM Misc Use to inject victoza into skin   Global Ease Inject Pen Needles 31G X 8 MM Misc Generic drug: Insulin Pen Needle USE AS DIRECTED   pioglitazone 45 MG tablet Commonly known as: ACTOS TAKE 1 TABLET BY MOUTH DAILY.   simvastatin 80 MG tablet Commonly known as: ZOCOR TAKE 1 TABLET BY MOUTH DAILY.   traZODone 150 MG tablet Commonly known as: DESYREL TAKE 1/2 TABLET (75 MG TOTAL) BY MOUTH AT BEDTIME.        Allergies:  Allergies  Allergen Reactions   Janumet Xr [Sitagliptin-Metformin Hcl Er] Rash    Tolerates the plain Januvia    Jardiance [Empagliflozin] Rash   Metformin And Related Diarrhea   Methotrexate Derivatives Other (See Comments)    transaminitis   Other Rash    Methyldibromo Gluteronitrile - skin product preservative    Past Medical History:  Diagnosis Date   Alcohol use 11/08/2015   Anxiety    Aortic aneurysm (HCC)    Diabetes mellitus without complication ([place]) 0960   Colisha Redler   GERD (gastroesophageal reflux disease)    Herpes labialis    History of chicken pox    Hyperlipidemia    Kidney stone     Past Surgical History:  Procedure Laterality Date   ABDOMINAL AORTOGRAM W/LOWER EXTREMITY N/A 07/22/2019   Procedure: ABDOMINAL AORTOGRAM W/LOWER EXTREMITY;  Surgeon: Elam Dutch, MD;  Location: [facility];  Service: Cardiovascular;  Laterality: N/A;   APPENDECTOMY     FINGER SURGERY Right    2 screws in ring finger   PERIPHERAL VASCULAR INTERVENTION Left  07/22/2019   Procedure: PERIPHERAL VASCULAR INTERVENTION;  Surgeon: Elam Dutch, MD;  Location: [facility];  Service: Cardiovascular;  Laterality: Left;   TONSILLECTOMY      Family History  Problem Relation Age of Onset   Diabetes Mother    Parkinsonism Mother 16       progressive supranuclear palsy, deceased   CAD  Father 59       MI at 7, CABG at 30   Diabetes Maternal Grandmother    Stroke Neg Hx    Cancer Neg Hx    Colon cancer Neg Hx    Esophageal cancer Neg Hx    Rectal cancer Neg Hx    Stomach cancer Neg Hx     Social History:  reports that he quit smoking about 2 years ago. His smoking use included cigarettes. He has a 7.50 pack-year smoking history. He has never been exposed to tobacco smoke. He has never used smokeless tobacco. He reports that he does not currently use alcohol. He reports that he does not currently use drugs.    Review of Systems    Lipid history:  Has been on 80 mg simvastatin for treatment, prescribed by PCP  His lipids are generally well controlled    Lab Results  Component Value Date   CHOL 131 01/17/2021   HDL 43.30 01/17/2021   LDLCALC 71 01/17/2021   TRIG 81.0 01/17/2021   CHOLHDL 3 01/17/2021         Lab Results  Component Value Date   ALT 17 12/23/2021    He has been refusing to take the Covid vaccine    He has been told to have atopic dermatitis by dermatologist and is on treatment   BP Readings from Last 3 Encounters:  12/26/21 122/74  10/11/21 120/79  10/03/21 (!) 156/92     Review of Systems  Most recent foot exam: 7/21  No numbness or tingling in his feet    Physical Examination:  BP 122/74   Pulse (!) 51   Ht _0  (1.702 m)   Wt 181 lb 3.2 oz (82.2 kg)   SpO2 96%   BMI 28.38 kg/m    ASSESSMENT:  Diabetes type 2, non-insulin-dependent  See history of present illness for detailed discussion of current diabetes management, blood sugar patterns and problems identified  His A1c is improved at 7.2 compared to 7.8 Also GMI on his Dexcom sensor is 7%  He is on glipizide ER 10 mg, Farxiga, basal insulin and Actos  As discussed above his main difficulty is hyperglycemia with regular soft drinks and sweet drinks overall and sometimes going off diet Blood sugars are actually at times low normal or low overnight likely  from combination of his glipizide and insulin Weight has gone up likely from somewhat better control overall  HYPERTENSION: His blood pressure is improving  Urine microalbumin normal  PLAN:    He will reduce glipizide to 5 mg to avoid tendency to low sugars overnight If he still has low blood sugars overnight we will stop this completely Insulin dose to be continued unchanged at 20 units However he does need to eliminate all soft drinks and regular sweet tea If he still has high readings consistently after meals may consider mealtime insulin this was discussed He will also try to work on reducing carbohydrate and calories to prevent any further weight gain  ev to be continued unchanged ery evening regularly and try to take it at suppertime with for  better compliance Continue to adjust this every 3 to 4 days if fasting readings are consistently over 130 Start using FREESTYLE libre 3 to help monitor blood sugar more closely and for convenience Discussed how this works, benefits and general guidelines Patient information given  He will need to likely adjust his diet better with using continuous glucose monitoring Discussed possibility of taking mealtime insulin if his postprandial readings are Try to exercise on the days he is not very active  No change in losartan for now   There are no Patient Instructions on file for this visit.     Elayne Snare 12/26/2021, 11:25 AM   Note: This office note was prepared with Dragon voice recognition system technology. Any transcriptional errors that result from this process are unintentional.

## 2022-01-01 ENCOUNTER — Encounter: Payer: Self-pay | Admitting: Endocrinology

## 2022-01-01 DIAGNOSIS — M9901 Segmental and somatic dysfunction of cervical region: Secondary | ICD-10-CM | POA: Diagnosis not present

## 2022-01-01 DIAGNOSIS — M9904 Segmental and somatic dysfunction of sacral region: Secondary | ICD-10-CM | POA: Diagnosis not present

## 2022-01-01 DIAGNOSIS — M9903 Segmental and somatic dysfunction of lumbar region: Secondary | ICD-10-CM | POA: Diagnosis not present

## 2022-01-01 DIAGNOSIS — M9902 Segmental and somatic dysfunction of thoracic region: Secondary | ICD-10-CM | POA: Diagnosis not present

## 2022-01-08 DIAGNOSIS — M9902 Segmental and somatic dysfunction of thoracic region: Secondary | ICD-10-CM | POA: Diagnosis not present

## 2022-01-08 DIAGNOSIS — M9904 Segmental and somatic dysfunction of sacral region: Secondary | ICD-10-CM | POA: Diagnosis not present

## 2022-01-08 DIAGNOSIS — M9903 Segmental and somatic dysfunction of lumbar region: Secondary | ICD-10-CM | POA: Diagnosis not present

## 2022-01-08 DIAGNOSIS — M9901 Segmental and somatic dysfunction of cervical region: Secondary | ICD-10-CM | POA: Diagnosis not present

## 2022-01-13 DIAGNOSIS — M9903 Segmental and somatic dysfunction of lumbar region: Secondary | ICD-10-CM | POA: Diagnosis not present

## 2022-01-13 DIAGNOSIS — M9904 Segmental and somatic dysfunction of sacral region: Secondary | ICD-10-CM | POA: Diagnosis not present

## 2022-01-13 DIAGNOSIS — M9901 Segmental and somatic dysfunction of cervical region: Secondary | ICD-10-CM | POA: Diagnosis not present

## 2022-01-13 DIAGNOSIS — M9902 Segmental and somatic dysfunction of thoracic region: Secondary | ICD-10-CM | POA: Diagnosis not present

## 2022-01-21 ENCOUNTER — Other Ambulatory Visit: Payer: Self-pay | Admitting: Family Medicine

## 2022-02-05 DIAGNOSIS — M9904 Segmental and somatic dysfunction of sacral region: Secondary | ICD-10-CM | POA: Diagnosis not present

## 2022-02-05 DIAGNOSIS — M9902 Segmental and somatic dysfunction of thoracic region: Secondary | ICD-10-CM | POA: Diagnosis not present

## 2022-02-05 DIAGNOSIS — M9901 Segmental and somatic dysfunction of cervical region: Secondary | ICD-10-CM | POA: Diagnosis not present

## 2022-02-05 DIAGNOSIS — M9903 Segmental and somatic dysfunction of lumbar region: Secondary | ICD-10-CM | POA: Diagnosis not present

## 2022-02-06 ENCOUNTER — Other Ambulatory Visit: Payer: Self-pay | Admitting: Family Medicine

## 2022-02-24 ENCOUNTER — Other Ambulatory Visit: Payer: Self-pay | Admitting: Family Medicine

## 2022-02-24 ENCOUNTER — Other Ambulatory Visit: Payer: Self-pay | Admitting: Endocrinology

## 2022-02-24 DIAGNOSIS — K219 Gastro-esophageal reflux disease without esophagitis: Secondary | ICD-10-CM

## 2022-02-25 ENCOUNTER — Telehealth: Payer: Self-pay

## 2022-02-25 NOTE — Telephone Encounter (Signed)
PA started for patient on cover my meds. Pending review from insurance.

## 2022-02-26 NOTE — Telephone Encounter (Signed)
Received faxed PA approval, valid 02/25/2022- 02/26/2023.

## 2022-03-06 ENCOUNTER — Other Ambulatory Visit: Payer: Self-pay | Admitting: Endocrinology

## 2022-03-06 DIAGNOSIS — E1165 Type 2 diabetes mellitus with hyperglycemia: Secondary | ICD-10-CM

## 2022-03-24 ENCOUNTER — Encounter: Payer: Self-pay | Admitting: Family Medicine

## 2022-03-24 ENCOUNTER — Ambulatory Visit (INDEPENDENT_AMBULATORY_CARE_PROVIDER_SITE_OTHER): Payer: 59 | Admitting: Family Medicine

## 2022-03-24 ENCOUNTER — Ambulatory Visit (INDEPENDENT_AMBULATORY_CARE_PROVIDER_SITE_OTHER)
Admission: RE | Admit: 2022-03-24 | Discharge: 2022-03-24 | Disposition: A | Discharge status: Home / Self Care | Payer: 59 | Source: Ambulatory Visit | Attending: Family Medicine | Admitting: Family Medicine

## 2022-03-24 VITALS — BP 122/70 | HR 55 | Temp 97.6°F | Ht 66.5 in | Wt 180.0 lb

## 2022-03-24 DIAGNOSIS — E1151 Type 2 diabetes mellitus with diabetic peripheral angiopathy without gangrene: Secondary | ICD-10-CM | POA: Diagnosis not present

## 2022-03-24 DIAGNOSIS — M545 Low back pain, unspecified: Secondary | ICD-10-CM | POA: Diagnosis not present

## 2022-03-24 DIAGNOSIS — K219 Gastro-esophageal reflux disease without esophagitis: Secondary | ICD-10-CM

## 2022-03-24 DIAGNOSIS — E785 Hyperlipidemia, unspecified: Secondary | ICD-10-CM | POA: Diagnosis not present

## 2022-03-24 DIAGNOSIS — I77811 Abdominal aortic ectasia: Secondary | ICD-10-CM | POA: Diagnosis not present

## 2022-03-24 DIAGNOSIS — Z794 Long term (current) use of insulin: Secondary | ICD-10-CM

## 2022-03-24 DIAGNOSIS — Z0001 Encounter for general adult medical examination with abnormal findings: Secondary | ICD-10-CM | POA: Diagnosis not present

## 2022-03-24 DIAGNOSIS — Z8249 Family history of ischemic heart disease and other diseases of the circulatory system: Secondary | ICD-10-CM | POA: Diagnosis not present

## 2022-03-24 DIAGNOSIS — E1169 Type 2 diabetes mellitus with other specified complication: Secondary | ICD-10-CM | POA: Diagnosis not present

## 2022-03-24 DIAGNOSIS — Z125 Encounter for screening for malignant neoplasm of prostate: Secondary | ICD-10-CM

## 2022-03-24 DIAGNOSIS — F4322 Adjustment disorder with anxiety: Secondary | ICD-10-CM

## 2022-03-24 DIAGNOSIS — I700 Atherosclerosis of aorta: Secondary | ICD-10-CM | POA: Diagnosis not present

## 2022-03-24 DIAGNOSIS — I10 Essential (primary) hypertension: Secondary | ICD-10-CM

## 2022-03-24 DIAGNOSIS — Z23 Encounter for immunization: Secondary | ICD-10-CM | POA: Diagnosis not present

## 2022-03-24 DIAGNOSIS — Z95828 Presence of other vascular implants and grafts: Secondary | ICD-10-CM

## 2022-03-24 DIAGNOSIS — I708 Atherosclerosis of other arteries: Secondary | ICD-10-CM

## 2022-03-24 DIAGNOSIS — Z87891 Personal history of nicotine dependence: Secondary | ICD-10-CM | POA: Diagnosis not present

## 2022-03-24 DIAGNOSIS — R69 Illness, unspecified: Secondary | ICD-10-CM | POA: Diagnosis not present

## 2022-03-24 DIAGNOSIS — G8929 Other chronic pain: Secondary | ICD-10-CM

## 2022-03-24 LAB — CBC WITH DIFFERENTIAL/PLATELET
Basophils Absolute: 0 10*3/uL (ref 0.0–0.1)
Basophils Relative: 0.7 % (ref 0.0–3.0)
Eosinophils Absolute: 0.1 10*3/uL (ref 0.0–0.7)
Eosinophils Relative: 1.2 % (ref 0.0–5.0)
HCT: 49.9 % (ref 39.0–52.0)
Hemoglobin: 16.7 g/dL (ref 13.0–17.0)
Lymphocytes Relative: 29.2 % (ref 12.0–46.0)
Lymphs Abs: 1.3 10*3/uL (ref 0.7–4.0)
MCHC: 33.5 g/dL (ref 30.0–36.0)
MCV: 101.7 fl — ABNORMAL HIGH (ref 78.0–100.0)
Monocytes Absolute: 0.4 10*3/uL (ref 0.1–1.0)
Monocytes Relative: 8.7 % (ref 3.0–12.0)
Neutro Abs: 2.7 10*3/uL (ref 1.4–7.7)
Neutrophils Relative %: 60.2 % (ref 43.0–77.0)
Platelets: 202 10*3/uL (ref 150.0–400.0)
RBC: 4.91 Mil/uL (ref 4.22–5.81)
RDW: 13.5 % (ref 11.5–15.5)
WBC: 4.4 10*3/uL (ref 4.0–10.5)

## 2022-03-24 LAB — URINALYSIS, ROUTINE W REFLEX MICROSCOPIC
Bilirubin Urine: NEGATIVE
Hgb urine dipstick: NEGATIVE
Ketones, ur: NEGATIVE
Leukocytes,Ua: NEGATIVE
Nitrite: NEGATIVE
RBC / HPF: NONE SEEN (ref 0–?)
Specific Gravity, Urine: 1.02 (ref 1.000–1.030)
Total Protein, Urine: NEGATIVE
Urine Glucose: >=1000 — AB
Urobilinogen, UA: 0.2 (ref 0.0–1.0)
WBC, UA: NONE SEEN (ref 0–?)
pH: 5.5 (ref 5.0–8.0)

## 2022-03-24 LAB — LIPID PANEL
Cholesterol: 148 mg/dL (ref 0–200)
HDL: 48 mg/dL (ref >39.00)
LDL Cholesterol: 84 mg/dL (ref 0–99)
NonHDL: 99.95
Total CHOL/HDL Ratio: 3
Triglycerides: 80 mg/dL (ref 0.0–149.0)
VLDL: 16 mg/dL (ref 0.0–40.0)

## 2022-03-24 LAB — PSA: PSA: 1.15 ng/mL (ref 0.10–4.00)

## 2022-03-24 LAB — SEDIMENTATION RATE: Sed Rate: 10 mm/hr (ref 0–20)

## 2022-03-24 MED ORDER — PANTOPRAZOLE SODIUM 40 MG PO TBEC: 40.0000 mg | DELAYED_RELEASE_TABLET | Freq: Every day | ORAL | 3 refills | Status: DC

## 2022-03-24 MED ORDER — CITALOPRAM HYDROBROMIDE 20 MG PO TABS: 20.0000 mg | ORAL_TABLET | Freq: Every day | ORAL | 3 refills | Status: DC

## 2022-03-24 MED ORDER — PIOGLITAZONE HCL 45 MG PO TABS: 45.0000 mg | ORAL_TABLET | Freq: Every day | ORAL | 3 refills | Status: DC

## 2022-03-24 MED ORDER — LOSARTAN POTASSIUM 50 MG PO TABS: 50.0000 mg | ORAL_TABLET | Freq: Every day | ORAL | 3 refills | Status: DC

## 2022-03-24 MED ORDER — TRAZODONE HCL 150 MG PO TABS: ORAL_TABLET | ORAL | 3 refills | Status: DC

## 2022-03-24 MED ORDER — SIMVASTATIN 80 MG PO TABS: 80.0000 mg | ORAL_TABLET | Freq: Every day | ORAL | 3 refills | Status: DC

## 2022-03-24 MED ORDER — BUSPIRONE HCL 15 MG PO TABS: 15.0000 mg | ORAL_TABLET | Freq: Two times a day (BID) | ORAL | 3 refills | Status: DC

## 2022-03-24 NOTE — Assessment & Plan Note (Signed)
Stable period on current regimen - continue.  

## 2022-03-24 NOTE — Assessment & Plan Note (Signed)
Remains abstinent.  Doesn't seem eligible for lung cancer screening program as has <20 PY smoking hx

## 2022-03-24 NOTE — Assessment & Plan Note (Signed)
Chronic, update FLP on high dose simvastatin. The 10-year ASCVD risk score (Arnett DK, et al., 2019) is: 5.7%   Values used to calculate the score:     Age: 51 years     Sex: Male     Is Non-Hispanic African American: No     Diabetic: Yes     Tobacco smoker: No     Systolic Blood Pressure: 122 mmHg     Is BP treated: Yes     HDL Cholesterol: 43.3 mg/dL     Total Cholesterol: 131 mg/dL

## 2022-03-24 NOTE — Assessment & Plan Note (Signed)
Stable period on pantoprazole daily - continue.

## 2022-03-24 NOTE — Assessment & Plan Note (Signed)
Preventative protocols reviewed and updated unless pt declined. Discussed healthy diet and lifestyle.  

## 2022-03-24 NOTE — Assessment & Plan Note (Addendum)
Appreciate endo care. Planned A1c next month.  Update UA.

## 2022-03-24 NOTE — Assessment & Plan Note (Signed)
Ongoing for 1.5 yrs, no known inciting trauma/injury. No red flags. Exam suspicious for L sacroiliitis. Does not describe inflammatory arthritis symptoms- as symptoms are better in am and worse as day progresses. ?OA related. Will check lumbar films, refer to PM&R for further evaluation.

## 2022-03-24 NOTE — Assessment & Plan Note (Addendum)
Appreciate [facility] care.  Last imaged 10/2021.

## 2022-03-24 NOTE — Addendum Note (Signed)
Addended by: Eustaquio Boyden on: 03/24/2022 10:59 AM   Modules accepted: Orders

## 2022-03-24 NOTE — Assessment & Plan Note (Signed)
Chronic, stable. Continue current regimen. 

## 2022-03-24 NOTE — Progress Notes (Addendum)
  Patient ID: Andrew Hart, male    DOB: 07/10/1970, 51 y.o.   MRN: [id number]  This visit was conducted in person.  BP 122/70   Pulse (!) 55   Temp 97.6 F (36.4 C)   Ht 5' 6.5" (1.689 m)   Wt 180 lb (81.6 kg)   SpO2 95%   BMI 28.62 kg/m    CC: CPE Subjective:   HPI: Andrew Hart is a 51 y.o. male presenting on 03/24/2022 for Annual Exam (Physical  , pt is having some back )   PAD s/p L CIA stent 07/2019 followed yearly by [facility], last seen 10/2021. They are also monitoring AAA (latest 3.7cm).  DM - followed by endo ([facility]).   Chronic lower back pain present since early 2022. Has seen chiropractor. [facility] didn't think this was not vascular related. Points to mid lower back, slightly more on left. No shooting pain down legs, numbness, tingling, weakness. No bowel/bladder incontinence. More pain with bending over. Notes chronic loose stools.   Continues dupixent for atopic dermatitis with benefit.   Preventative: Colonoscopy 02/2020 - WNL rpt 10 yrs ([facility])  Prostate cancer screening - yearly PSA Lung cancer screening - quit 04/2019. Not eligible for lung cancer screening.  Flu shot yearly COVID vaccine - did not receive  Pneumovax 2016 Tdap 2014 Shingrix - discussed, decline Seat belt use discussed Sunscreen use discussed. No changing moles on skin.  Sleep - averaging 8 hours/night Ex-smoker - 1/2 ppd x 34 yrs. Quit 04/2019 Alcohol - seldom MJ - stopped since 08/16/2017   Dentist Q6 mo Eye exam - yearly   Lives with wife and 2 children (daughter 2003, son 2006)  Occ: owns cleaning business  Activity: active at work, at home on yard  Diet: some water, fruits/vegetables daily, lots of soda and gatorade     Relevant past medical, surgical, family and social history reviewed and updated as indicated. Interim medical history since our last visit reviewed. Allergies and medications reviewed and updated. Outpatient Medications Prior to Visit  Medication Sig Dispense  Refill   acyclovir (ZOVIRAX) 200 MG capsule TAKE 1 CAPSULE BY MOUTH 3 TIMES DAILY FOR 3 TO 5 DAYS AS NEEDED. 30 capsule 3   aspirin EC 81 MG tablet Take 1 tablet (81 mg total) by mouth every Monday, Wednesday, and Friday.     BAYER MICROLET LANCETS lancets Use as instructed to check blood sugar twice a day dx E11.65 100 each 3   Blood Glucose Monitoring Suppl (CONTOUR NEXT MONITOR) w/Device KIT CHECK BLOOD SUGAR TWICE DAILY 1 kit 0   cholecalciferol (VITAMIN D3) 25 MCG (1000 UNIT) tablet Take 1,000 Units by mouth daily.     Continuous Blood Gluc Sensor (DEXCOM G7 SENSOR) MISC CHANGE EVERY 10 DAYS 3 each 3   Cyanocobalamin (B-12) 1000 MCG SUBL Place 1 tablet under the tongue daily.     dapagliflozin propanediol (FARXIGA) 10 MG TABS tablet Take 1 tablet (10 mg total) by mouth daily before breakfast. 90 tablet 2   dupilumab (DUPIXENT) 300 MG/2ML prefilled syringe Inject 300 mg into the skin every 14 (fourteen) days.      glipiZIDE (GLUCOTROL XL) 5 MG 24 hr tablet TAKE 1 TABLET BY MOUTH DAILY WITH BREAKFAST. 30 tablet 1   GLOBAL EASE INJECT PEN NEEDLES 31G X 8 MM MISC USE AS DIRECTED 100 each 0   glucose blood (ONETOUCH VERIO) test strip Use as instructed 100 each 2   halobetasol (ULTRAVATE) 0.05 % cream Apply 1   application topically 2 (two) times daily. Can cover with Saran wrap fo r30 min.  DO NOT apply to face/groin.     HYDROcodone-acetaminophen (NORCO/VICODIN) 5-325 MG tablet Take 1 tablet by mouth 3 (three) times daily as needed for moderate pain (sedation precautions). 15 tablet 0   hydrOXYzine (ATARAX/VISTARIL) 25 MG tablet Take 25 mg by mouth 3 (three) times daily as needed for itching.   0   ibuprofen (ADVIL) 200 MG tablet Take 400 mg by mouth every 6 (six) hours as needed for headache or moderate pain.     Insulin Glargine (BASAGLAR KWIKPEN) 100 UNIT/ML START WITH 6 UNITS, ADJUST UP TO 20 UNITS AT NIGHT DAILY AS DIRECTED 15 mL 1   Insulin Pen Needle (PEN NEEDLES) 32G X 4 MM MISC Use to  inject victoza into skin 50 each 2   busPIRone (BUSPAR) 15 MG tablet Take 1 tablet (15 mg total) by mouth 2 (two) times daily. 180 tablet 3   citalopram (CELEXA) 20 MG tablet Take 1 tablet (20 mg total) by mouth daily. 90 tablet 3   losartan (COZAAR) 50 MG tablet Take 1 tablet (50 mg total) by mouth daily. 30 tablet 6   pantoprazole (PROTONIX) 40 MG tablet TAKE 1 TABLET BY MOUTH DAILY. 90 tablet 0   pioglitazone (ACTOS) 45 MG tablet TAKE 1 TABLET BY MOUTH DAILY. 30 tablet 1   simvastatin (ZOCOR) 80 MG tablet TAKE 1 TABLET BY MOUTH DAILY. 30 tablet 1   traZODone (DESYREL) 150 MG tablet TAKE 1/2 TABLET (75 MG TOTAL) BY MOUTH AT BEDTIME. 45 tablet 3   No facility-administered medications prior to visit.     Per HPI unless specifically indicated in ROS section below Review of Systems  Constitutional:  Negative for activity change, appetite change, chills, fatigue, fever and unexpected weight change.  HENT:  Negative for hearing loss.   Eyes:  Negative for visual disturbance.  Respiratory:  Negative for cough, chest tightness, shortness of breath and wheezing.   Cardiovascular:  Negative for chest pain, palpitations and leg swelling.  Gastrointestinal:  Positive for diarrhea (loose stools). Negative for abdominal distention, abdominal pain, blood in stool, constipation, nausea and vomiting.  Genitourinary:  Negative for difficulty urinating and hematuria.  Musculoskeletal:  Negative for arthralgias, myalgias and neck pain.  Skin:  Negative for rash.  Neurological:  Negative for dizziness, seizures, syncope and headaches.  Hematological:  Negative for adenopathy. Does not bruise/bleed easily.  Psychiatric/Behavioral:  Negative for dysphoric mood. The patient is not nervous/anxious.     Objective:  BP 122/70   Pulse (!) 55   Temp 97.6 F (36.4 C)   Ht 5' 6.5" (1.689 m)   Wt 180 lb (81.6 kg)   SpO2 95%   BMI 28.62 kg/m   Wt Readings from Last 3 Encounters:  03/24/22 180 lb (81.6 kg)   12/26/21 181 lb 3.2 oz (82.2 kg)  10/11/21 179 lb 14.4 oz (81.6 kg)      Physical Exam Vitals and nursing note reviewed.  Constitutional:      General: He is not in acute distress.    Appearance: Normal appearance. He is well-developed. He is not ill-appearing.  HENT:     Head: Normocephalic and atraumatic.     Right Ear: Hearing, tympanic membrane, ear canal and external ear normal.     Left Ear: Hearing, tympanic membrane, ear canal and external ear normal.     Nose: Nose normal.     Mouth/Throat:     Mouth: Mucous   membranes are moist.     Pharynx: Oropharynx is clear. No oropharyngeal exudate or posterior oropharyngeal erythema.  Eyes:     General: No scleral icterus.    Extraocular Movements: Extraocular movements intact.     Conjunctiva/sclera: Conjunctivae normal.     Pupils: Pupils are equal, round, and reactive to light.  Neck:     Thyroid: No thyroid mass or thyromegaly.     Vascular: No carotid bruit.  Cardiovascular:     Rate and Rhythm: Normal rate and regular rhythm.     Pulses: Normal pulses.          Radial pulses are 2+ on the right side and 2+ on the left side.     Heart sounds: Normal heart sounds. No murmur heard. Pulmonary:     Effort: Pulmonary effort is normal. No respiratory distress.     Breath sounds: Normal breath sounds. No wheezing, rhonchi or rales.  Abdominal:     General: Bowel sounds are normal. There is no distension.     Palpations: Abdomen is soft. There is no mass.     Tenderness: There is no abdominal tenderness. There is no guarding or rebound.     Hernia: No hernia is present.  Musculoskeletal:        General: Normal range of motion.     Cervical back: Normal range of motion and neck supple.     Right lower leg: No edema.     Left lower leg: No edema.     Comments:  No pain midline spine No paraspinous mm tenderness Neg SLR bilaterally - just tightness at bilateral hamstrings. No pain with int/ext rotation at hip.  +FABER on  left. No pain at GTB or sciatic notch bilaterally. Discomfort to palpation at L SIJ  Lymphadenopathy:     Cervical: No cervical adenopathy.  Skin:    General: Skin is warm and dry.     Findings: No rash.  Neurological:     General: No focal deficit present.     Mental Status: He is alert and oriented to person, place, and time.  Psychiatric:        Mood and Affect: Mood normal.        Behavior: Behavior normal.        Thought Content: Thought content normal.        Judgment: Judgment normal.       Results for orders placed or performed in visit on 12/23/21  Microalbumin / creatinine urine ratio  Result Value Ref Range   Microalb, Ur <0.7 0.0 - 1.9 mg/dL   Creatinine,U 90.1 mg/dL   Microalb Creat Ratio 0.8 0.0 - 30.0 mg/g  Comprehensive metabolic panel  Result Value Ref Range   Sodium 139 135 - 145 mEq/L   Potassium 4.5 3.5 - 5.1 mEq/L   Chloride 103 96 - 112 mEq/L   CO2 29 19 - 32 mEq/L   Glucose, Bld 163 (H) 70 - 99 mg/dL   BUN 18 6 - 23 mg/dL   Creatinine, Ser 1.05 0.40 - 1.50 mg/dL   Total Bilirubin 0.7 0.2 - 1.2 mg/dL   Alkaline Phosphatase 73 39 - 117 U/L   AST 17 0 - 37 U/L   ALT 17 0 - 53 U/L   Total Protein 6.4 6.0 - 8.3 g/dL   Albumin 4.3 3.5 - 5.2 g/dL   GFR 81.97 >60.00 mL/min   Calcium 8.8 8.4 - 10.5 mg/dL  Hemoglobin A1c  Result Value Ref Range  Hgb A1c MFr Bld 7.2 (H) 4.6 - 6.5 %   Lab Results  Component Value Date   CHOL 131 01/17/2021   HDL 43.30 01/17/2021   LDLCALC 71 01/17/2021   TRIG 81.0 01/17/2021   CHOLHDL 3 01/17/2021     Assessment & Plan:   Problem List Items Addressed This Visit     Encounter for general adult medical examination with abnormal findings - Primary (Chronic)    Preventative protocols reviewed and updated unless pt declined. Discussed healthy diet and lifestyle.       Type 2 diabetes mellitus with other specified complication ([place])    Appreciate endo care. Planned A1c next month.  Update UA.       Relevant  Medications   losartan (COZAAR) 50 MG tablet   pioglitazone (ACTOS) 45 MG tablet   simvastatin (ZOCOR) 80 MG tablet   Other Relevant Orders   Urinalysis, Routine w reflex microscopic   Hyperlipidemia associated with type 2 diabetes mellitus (HCC)    Chronic, update FLP on high dose simvastatin. The 10-year ASCVD risk score (Arnett DK, et al., 2019) is: 5.7%   Values used to calculate the score:     Age: 46 years     Sex: Male     Is Non-Hispanic African American: No     Diabetic: Yes     Tobacco smoker: No     Systolic Blood Pressure: 426 mmHg     Is BP treated: Yes     HDL Cholesterol: 43.3 mg/dL     Total Cholesterol: 131 mg/dL       Relevant Medications   losartan (COZAAR) 50 MG tablet   pioglitazone (ACTOS) 45 MG tablet   simvastatin (ZOCOR) 80 MG tablet   Other Relevant Orders   Lipid panel   Adjustment disorder with anxiety    Stable period on current regimen - continue.      GERD (gastroesophageal reflux disease)    Stable period on pantoprazole daily - continue.       Relevant Medications   pantoprazole (PROTONIX) 40 MG tablet   Ex-smoker    Remains abstinent.  Doesn't seem eligible for lung cancer screening program as has <20 PY smoking hx      Family history of premature CAD   Abdominal aortic ectasia (HCC)    Appreciate [facility] care.  Last imaged 10/2021.       Relevant Medications   losartan (COZAAR) 50 MG tablet   simvastatin (ZOCOR) 80 MG tablet   Aorto-iliac atherosclerosis (HCC)    Continue aspirin, statin.       Relevant Medications   losartan (COZAAR) 50 MG tablet   simvastatin (ZOCOR) 80 MG tablet   Diabetic peripheral vascular disease (HCC)   Relevant Medications   losartan (COZAAR) 50 MG tablet   pioglitazone (ACTOS) 45 MG tablet   simvastatin (ZOCOR) 80 MG tablet   S/P insertion of iliac artery stent   Chronic left-sided low back pain without sciatica    Ongoing for 1.5 yrs, no known inciting trauma/injury. No red flags. Exam  suspicious for L sacroiliitis. Does not describe inflammatory arthritis symptoms- as symptoms are better in am and worse as day progresses. ?OA related. Will check lumbar films, refer to PM&R for further evaluation.       Relevant Medications   citalopram (CELEXA) 20 MG tablet   traZODone (DESYREL) 150 MG tablet   Other Relevant Orders   DG Lumbar Spine Complete   Sedimentation rate   CBC with Differential/Platelet  Ambulatory referral to Physical Medicine Rehab   Primary hypertension    Chronic, stable. Continue current regimen.       Relevant Medications   losartan (COZAAR) 50 MG tablet   simvastatin (ZOCOR) 80 MG tablet   Other Visit Diagnoses     Special screening for malignant neoplasm of prostate       Relevant Orders   PSA        Meds ordered this encounter  Medications   busPIRone (BUSPAR) 15 MG tablet    Sig: Take 1 tablet (15 mg total) by mouth 2 (two) times daily.    Dispense:  180 tablet    Refill:  3   citalopram (CELEXA) 20 MG tablet    Sig: Take 1 tablet (20 mg total) by mouth daily.    Dispense:  90 tablet    Refill:  3   losartan (COZAAR) 50 MG tablet    Sig: Take 1 tablet (50 mg total) by mouth daily.    Dispense:  90 tablet    Refill:  3   pantoprazole (PROTONIX) 40 MG tablet    Sig: Take 1 tablet (40 mg total) by mouth daily.    Dispense:  90 tablet    Refill:  3   pioglitazone (ACTOS) 45 MG tablet    Sig: Take 1 tablet (45 mg total) by mouth daily.    Dispense:  90 tablet    Refill:  3   simvastatin (ZOCOR) 80 MG tablet    Sig: Take 1 tablet (80 mg total) by mouth daily.    Dispense:  90 tablet    Refill:  3   traZODone (DESYREL) 150 MG tablet    Sig: TAKE 1/2 TABLET (75 MG TOTAL) BY MOUTH AT BEDTIME.    Dispense:  135 tablet    Refill:  3   Orders Placed This Encounter  Procedures   DG Lumbar Spine Complete    Standing Status:   Future    Number of Occurrences:   1    Standing Expiration Date:   03/25/2023    Order Specific  Question:   Reason for Exam (SYMPTOM  OR DIAGNOSIS REQUIRED)    Answer:   chronic low back pain    Order Specific Question:   Preferred imaging location?    Answer:   [facility]   Flu Vaccine QUAD 6+ mos PF IM (Fluarix Quad PF)   Lipid panel   PSA   Sedimentation rate   CBC with Differential/Platelet   Urinalysis, Routine w reflex microscopic   Ambulatory referral to Physical Medicine Rehab    Referral Priority:   Routine    Referral Type:   Rehabilitation    Referral Reason:   Specialty Services Required    Requested Specialty:   Physical Medicine and Rehabilitation    Number of Visits Requested:   1    Patient instructions: Flu shot today  Labs today  Lower back xray today. You have pain at your left sacroiliac joint. I recommend referral to physical medicine and rehab doctor for evaluation.  Good to see you today.  Return as needed or in 1 year for next physical.   Follow up plan: Return in about 1 year (around 03/25/2023) for annual exam, prior fasting for blood work.  Ria Bush, MD

## 2022-03-24 NOTE — Assessment & Plan Note (Signed)
Continue aspirin, statin.  

## 2022-03-24 NOTE — Patient Instructions (Addendum)
Flu shot today  Labs today  Lower back xray today. You have pain at your left sacroiliac joint. I recommend referral to physical medicine and rehab doctor for evaluation.  Good to see you today Return as needed or in 1 year for next physical.   Health Maintenance, Male Adopting a healthy lifestyle and getting preventive care are important in promoting health and wellness. Ask your health care provider about: The right schedule for you to have regular tests and exams. Things you can do on your own to prevent diseases and keep yourself healthy. What should I know about diet, weight, and exercise? Eat a healthy diet  Eat a diet that includes plenty of vegetables, fruits, low-fat dairy products, and lean protein. Do not eat a lot of foods that are high in solid fats, added sugars, or sodium. Maintain a healthy weight Body mass index (BMI) is a measurement that can be used to identify possible weight problems. It estimates body fat based on height and weight. Your health care provider can help determine your BMI and help you achieve or maintain a healthy weight. Get regular exercise Get regular exercise. This is one of the most important things you can do for your health. Most adults should: Exercise for at least 150 minutes each week. The exercise should increase your heart rate and make you sweat (moderate-intensity exercise). Do strengthening exercises at least twice a week. This is in addition to the moderate-intensity exercise. Spend less time sitting. Even light physical activity can be beneficial. Watch cholesterol and blood lipids Have your blood tested for lipids and cholesterol at 52 years of age, then have this test every 5 years. You may need to have your cholesterol levels checked more often if: Your lipid or cholesterol levels are high. You are older than 52 years of age. You are at high risk for heart disease. What should I know about cancer screening? Many types of cancers can  be detected early and may often be prevented. Depending on your health history and family history, you may need to have cancer screening at various ages. This may include screening for: Colorectal cancer. Prostate cancer. Skin cancer. Lung cancer. What should I know about heart disease, diabetes, and high blood pressure? Blood pressure and heart disease High blood pressure causes heart disease and increases the risk of stroke. This is more likely to develop in people who have high blood pressure readings or are overweight. Talk with your health care provider about your target blood pressure readings. Have your blood pressure checked: Every 3-5 years if you are 63-56 years of age. Every year if you are 16 years old or older. If you are between the ages of 31 and 52 and are a current or former smoker, ask your health care provider if you should have a one-time screening for abdominal aortic aneurysm (AAA). Diabetes Have regular diabetes screenings. This checks your fasting blood sugar level. Have the screening done: Once every three years after age 52 if you are at a normal weight and have a low risk for diabetes. More often and at a younger age if you are overweight or have a high risk for diabetes. What should I know about preventing infection? Hepatitis B If you have a higher risk for hepatitis B, you should be screened for this virus. Talk with your health care provider to find out if you are at risk for hepatitis B infection. Hepatitis C Blood testing is recommended for: Everyone born from 16 through  65. Anyone with known risk factors for hepatitis C. Sexually transmitted infections (STIs) You should be screened each year for STIs, including gonorrhea and chlamydia, if: You are sexually active and are younger than 52 years of age. You are older than 52 years of age and your health care provider tells you that you are at risk for this type of infection. Your sexual activity has  changed since you were last screened, and you are at increased risk for chlamydia or gonorrhea. Ask your health care provider if you are at risk. Ask your health care provider about whether you are at high risk for HIV. Your health care provider may recommend a prescription medicine to help prevent HIV infection. If you choose to take medicine to prevent HIV, you should first get tested for HIV. You should then be tested every 3 months for as long as you are taking the medicine. Follow these instructions at home: Alcohol use Do not drink alcohol if your health care provider tells you not to drink. If you drink alcohol: Limit how much you have to 0-2 drinks a day. Know how much alcohol is in your drink. In the [place] one drink equals one 12 oz bottle of beer (355 mL), one 5 oz glass of wine (148 mL), or one 1 oz glass of hard liquor (44 mL). Lifestyle Do not use any products that contain nicotine or tobacco. These products include cigarettes, chewing tobacco, and vaping devices, such as e-cigarettes. If you need help quitting, ask your health care provider. Do not use street drugs. Do not share needles. Ask your health care provider for help if you need support or information about quitting drugs. General instructions Schedule regular health, dental, and eye exams. Stay current with your vaccines. Tell your health care provider if: You often feel depressed. You have ever been abused or do not feel safe at home. Summary Adopting a healthy lifestyle and getting preventive care are important in promoting health and wellness. Follow your health care provider's instructions about healthy diet, exercising, and getting tested or screened for diseases. Follow your health care provider's instructions on monitoring your cholesterol and blood pressure. This information is not intended to replace advice given to you by your health care provider. Make sure you discuss any questions you have with your health  care provider. Document Revised: 09/10/2020 Document Reviewed: 09/10/2020 Elsevier Patient Education  2023 [employer].

## 2022-03-26 ENCOUNTER — Telehealth: Payer: Self-pay

## 2022-03-26 NOTE — Telephone Encounter (Signed)
Received call from Erskine Squibb at [facility] orthopedics regarding xray results from 11/20. Results printed and given to Dr. Sharen Hones. Informed message being sent to get his recommendations.   IMPRESSION: 1. 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 2. Severe atherosclerotic calcifications of the aorta. There is a 3.8 cm aortic aneurysm which may have increased in size compared to 2020. Follow-up ultrasound or CT recommended.

## 2022-03-26 NOTE — Telephone Encounter (Signed)
Noted. Known finding followed by [facility], last imaged 10/2021 3.7cm by vascular US.

## 2022-04-07 DIAGNOSIS — M9901 Segmental and somatic dysfunction of cervical region: Secondary | ICD-10-CM | POA: Diagnosis not present

## 2022-04-07 DIAGNOSIS — M9905 Segmental and somatic dysfunction of pelvic region: Secondary | ICD-10-CM | POA: Diagnosis not present

## 2022-04-07 DIAGNOSIS — M9903 Segmental and somatic dysfunction of lumbar region: Secondary | ICD-10-CM | POA: Diagnosis not present

## 2022-04-07 DIAGNOSIS — M9904 Segmental and somatic dysfunction of sacral region: Secondary | ICD-10-CM | POA: Diagnosis not present

## 2022-04-10 DIAGNOSIS — M9905 Segmental and somatic dysfunction of pelvic region: Secondary | ICD-10-CM | POA: Diagnosis not present

## 2022-04-10 DIAGNOSIS — M9903 Segmental and somatic dysfunction of lumbar region: Secondary | ICD-10-CM | POA: Diagnosis not present

## 2022-04-10 DIAGNOSIS — M9904 Segmental and somatic dysfunction of sacral region: Secondary | ICD-10-CM | POA: Diagnosis not present

## 2022-04-10 DIAGNOSIS — M9901 Segmental and somatic dysfunction of cervical region: Secondary | ICD-10-CM | POA: Diagnosis not present

## 2022-04-14 ENCOUNTER — Other Ambulatory Visit (INDEPENDENT_AMBULATORY_CARE_PROVIDER_SITE_OTHER): Payer: 59

## 2022-04-14 DIAGNOSIS — E785 Hyperlipidemia, unspecified: Secondary | ICD-10-CM | POA: Diagnosis not present

## 2022-04-14 DIAGNOSIS — M9903 Segmental and somatic dysfunction of lumbar region: Secondary | ICD-10-CM | POA: Diagnosis not present

## 2022-04-14 DIAGNOSIS — E1169 Type 2 diabetes mellitus with other specified complication: Secondary | ICD-10-CM

## 2022-04-14 DIAGNOSIS — M9905 Segmental and somatic dysfunction of pelvic region: Secondary | ICD-10-CM | POA: Diagnosis not present

## 2022-04-14 DIAGNOSIS — E1165 Type 2 diabetes mellitus with hyperglycemia: Secondary | ICD-10-CM

## 2022-04-14 DIAGNOSIS — M9904 Segmental and somatic dysfunction of sacral region: Secondary | ICD-10-CM | POA: Diagnosis not present

## 2022-04-14 DIAGNOSIS — M9901 Segmental and somatic dysfunction of cervical region: Secondary | ICD-10-CM | POA: Diagnosis not present

## 2022-04-14 DIAGNOSIS — Z794 Long term (current) use of insulin: Secondary | ICD-10-CM

## 2022-04-14 LAB — LDL CHOLESTEROL, DIRECT: Direct LDL: 79 mg/dL

## 2022-04-14 LAB — BASIC METABOLIC PANEL
BUN: 17 mg/dL (ref 6–23)
CO2: 28 mEq/L (ref 19–32)
Calcium: 8.8 mg/dL (ref 8.4–10.5)
Chloride: 103 mEq/L (ref 96–112)
Creatinine, Ser: 1.06 mg/dL (ref 0.40–1.50)
GFR: 80.87 mL/min (ref >60.00)
Glucose, Bld: 136 mg/dL — ABNORMAL HIGH (ref 70–99)
Potassium: 4.4 mEq/L (ref 3.5–5.1)
Sodium: 137 mEq/L (ref 135–145)

## 2022-04-14 LAB — HEMOGLOBIN A1C: Hgb A1c MFr Bld: 7.8 % — ABNORMAL HIGH (ref 4.6–6.5)

## 2022-04-16 ENCOUNTER — Encounter: Payer: Self-pay | Admitting: Endocrinology

## 2022-04-16 ENCOUNTER — Encounter: Payer: Self-pay | Admitting: Physical Medicine & Rehabilitation

## 2022-04-16 ENCOUNTER — Ambulatory Visit: Payer: 59 | Admitting: Endocrinology

## 2022-04-16 VITALS — BP 122/60 | HR 53 | Ht 66.0 in | Wt 186.0 lb

## 2022-04-16 DIAGNOSIS — E1165 Type 2 diabetes mellitus with hyperglycemia: Secondary | ICD-10-CM | POA: Diagnosis not present

## 2022-04-16 DIAGNOSIS — I10 Essential (primary) hypertension: Secondary | ICD-10-CM

## 2022-04-16 DIAGNOSIS — Z794 Long term (current) use of insulin: Secondary | ICD-10-CM

## 2022-04-16 MED ORDER — NOVOLOG FLEXPEN 100 UNIT/ML ~~LOC~~ SOPN: 12.0000 [IU] | PEN_INJECTOR | Freq: Three times a day (TID) | SUBCUTANEOUS | 1 refills | Status: DC

## 2022-04-16 MED ORDER — PEN NEEDLES 32G X 4 MM MISC: 2 refills | Status: DC

## 2022-04-16 NOTE — Progress Notes (Signed)
Patient ID: Andrew Hart, male   DOB: [birth year], 51 y.o.   MRN: [id number]           Reason for Appointment: Follow-up for Type 2 Diabetes    History of Present Illness:          Date of diagnosis of type 2 diabetes mellitus: 2007 ?        Background history:  He was diagnosed with routine screening lab from his work He was initially tried on metformin but this caused diarrhea and he could not continue this Subsequently was treated with glipizide and apparently this was able to control his blood sugars fairly well for a few years Probably in early 2016 he was given Actos in addition to help his glucose control as A1c had gone up to 9.4 This helped transiently and A1c was 7.7 However his A1c had been otherwise over 8% since mid 2015 Because of  high blood sugars on his initial consultation in 05/2014 (A1c was 9.1) he was started on Invokana and Onglyza in addition to his Actos and glipizide.   Recent history:   Non-insulin hypoglycemic drugs: glipizide ER 10 mg daily, Actos 45 mg daily, Farxiga 10 mg daily  Insulin dose: Basaglar 20 units at bedtime  His A1c has gone back up from 7.0-7.8  Lowest A1c on record 6.9  Fructosamine last 315   Current blood sugar patterns and problems identified: He has generally higher readings after meals compared to last visit and not clear why Currently he is still using the Dexcom sensor and now getting only 61% of readings within target He says he is generally eating out at lunch and most of this will be hamburgers and [place] fries, usually at work  Blood sugars do come down overnight but may be variably high after evening meal and late at night also  On his own he is taking 10 mg of glipizide instead of the 5 mg recommended He has gained 6 pounds recently  Because of back pain he has been less active  Continues to be on the same dose of Basaglar insulin which he takes in the evening as before  Occasionally blood sugar spikes may go up to  over 300 either from lunch or late evening       Side effects from medications have been: Diarrhea from regular metformin, nausea and constipation from Rybelsus, nausea from Trulicity, nausea and body aches from [facility]  Interpretation of G7 Dexcom download for the last 2 weeks as follows  Blood sugars are showing frequent hyperglycemic episodes during the day and occasionally late at night also; blood sugars are generally more consistently higher in the first week compared to the second week Overnight blood sugars are mostly averaging about 180 at midnight and generally decreasing significantly by 7 AM and generally higher in the last week without hypoglycemia HYPERGLYCEMIA is seen frequently after lunch in the afternoons and periodically in the evenings at different times Blood sugars are above the 180 target mostly in the afternoons No hypoglycemia  CGM use % of time 93  2-week average/GV 170/36  Time in range      61  %  % Time Above 180   % Time above 250   % Time Below 70      PRE-MEAL Fasting Lunch Dinner Bedtime Overall  Glucose range:       Averages: 124   188    POST-MEAL PC Breakfast PC Lunch PC Dinner  Glucose range:  Averages:  224    Previous data  CGM use % of time 93  2-week average/GV 150/37  Time in range       69%  % Time Above 180 21  % Time above 250 8  % Time Below 70 1   Dietician visit, most recent: 06/2015  Weight history:   Wt Readings from Last 3 Encounters:  04/16/22 186 lb (84.4 kg)  03/24/22 180 lb (81.6 kg)  12/26/21 181 lb 3.2 oz (82.2 kg)    Glycemic control:    Lab Results  Component Value Date   HGBA1C 7.8 (H) 04/14/2022   HGBA1C 7.2 (H) 12/23/2021   HGBA1C 7.8 (H) 10/01/2021   Lab Results  Component Value Date   MICROALBUR <0.7 12/23/2021   LDLCALC 84 03/24/2022   CREATININE 1.06 04/14/2022    Lab on 04/14/2022  Component Date Value Ref Range Status   Direct LDL 04/14/2022 79.0  mg/dL Final   Optimal:  <100  mg/dLNear or Above Optimal:  100-129 mg/dLBorderline High:  130-159 mg/dLHigh:  160-189 mg/dLVery High:  >190 mg/dL   Sodium 04/14/2022 137  135 - 145 mEq/L Final   Potassium 04/14/2022 4.4  3.5 - 5.1 mEq/L Final   Chloride 04/14/2022 103  96 - 112 mEq/L Final   CO2 04/14/2022 28  19 - 32 mEq/L Final   Glucose, Bld 04/14/2022 136 (H)  70 - 99 mg/dL Final   BUN 04/14/2022 17  6 - 23 mg/dL Final   Creatinine, Ser 04/14/2022 1.06  0.40 - 1.50 mg/dL Final   GFR 04/14/2022 80.87  >60.00 mL/min Final   Calculated using the CKD-EPI Creatinine Equation (2021)   Calcium 04/14/2022 8.8  8.4 - 10.5 mg/dL Final   Hgb A1c MFr Bld 04/14/2022 7.8 (H)  4.6 - 6.5 % Final   Glycemic Control Guidelines for People with Diabetes:Non Diabetic:  <6%Goal of Therapy: <7%Additional Action Suggested:  >8%       Allergies as of 04/16/2022       Reactions   Janumet Xr [sitagliptin-metformin Hcl Er] Rash   Tolerates the plain Januvia    Jardiance [empagliflozin] Rash   Metformin And Related Diarrhea   Methotrexate Derivatives Other (See Comments)   transaminitis   Other Rash   Methyldibromo Gluteronitrile - skin product preservative        Medication List        Accurate as of April 16, 2022  8:49 PM. If you have any questions, ask your nurse or doctor.          acyclovir 200 MG capsule Commonly known as: ZOVIRAX TAKE 1 CAPSULE BY MOUTH 3 TIMES DAILY FOR 3 TO 5 DAYS AS NEEDED.   aspirin EC 81 MG tablet Take 1 tablet (81 mg total) by mouth every Monday, Wednesday, and Friday.   B-12 1000 MCG Subl Place 1 tablet under the tongue daily.   Basaglar KwikPen 100 UNIT/ML START WITH 6 UNITS, ADJUST UP TO 20 UNITS AT NIGHT DAILY AS DIRECTED   Bayer Microlet Lancets lancets Use as instructed to check blood sugar twice a day dx E11.65   busPIRone 15 MG tablet Commonly known as: BUSPAR Take 1 tablet (15 mg total) by mouth 2 (two) times daily.   cholecalciferol 25 MCG (1000 UNIT)  tablet Commonly known as: VITAMIN D3 Take 1,000 Units by mouth daily.   citalopram 20 MG tablet Commonly known as: CELEXA Take 1 tablet (20 mg total) by mouth daily.   Contour Next Monitor w/Device  Kit CHECK BLOOD SUGAR TWICE DAILY   dapagliflozin propanediol 10 MG Tabs tablet Commonly known as: Farxiga Take 1 tablet (10 mg total) by mouth daily before breakfast.   Dexcom G7 Sensor Misc CHANGE EVERY 10 DAYS   dupilumab 300 MG/2ML prefilled syringe Commonly known as: DUPIXENT Inject 300 mg into the skin every 14 (fourteen) days.   glipiZIDE 5 MG 24 hr tablet Commonly known as: GLUCOTROL XL TAKE 1 TABLET BY MOUTH DAILY WITH BREAKFAST.   halobetasol 0.05 % cream Commonly known as: ULTRAVATE Apply 1 application topically 2 (two) times daily. Can cover with Saran wrap fo r30 min.  DO NOT apply to face/groin.   HYDROcodone-acetaminophen 5-325 MG tablet Commonly known as: NORCO/VICODIN Take 1 tablet by mouth 3 (three) times daily as needed for moderate pain (sedation precautions).   hydrOXYzine 25 MG tablet Commonly known as: ATARAX Take 25 mg by mouth 3 (three) times daily as needed for itching.   ibuprofen 200 MG tablet Commonly known as: ADVIL Take 400 mg by mouth every 6 (six) hours as needed for headache or moderate pain.   losartan 50 MG tablet Commonly known as: COZAAR Take 1 tablet (50 mg total) by mouth daily.   NovoLOG FlexPen 100 UNIT/ML FlexPen Generic drug: insulin aspart Inject 12 Units into the skin 3 (three) times daily with meals. Started by: Elayne Snare, MD   OneTouch Verio test strip Generic drug: glucose blood Use as instructed   pantoprazole 40 MG tablet Commonly known as: PROTONIX Take 1 tablet (40 mg total) by mouth daily.   Pen Needles 32G X 4 MM Misc Use to inject insulin 2x daily into skin What changed:  additional instructions Another medication with the same name was removed. Continue taking this medication, and follow the directions  you see here. Changed by: Elayne Snare, MD   pioglitazone 45 MG tablet Commonly known as: ACTOS Take 1 tablet (45 mg total) by mouth daily.   simvastatin 80 MG tablet Commonly known as: ZOCOR Take 1 tablet (80 mg total) by mouth daily.   traZODone 150 MG tablet Commonly known as: DESYREL TAKE 1/2 TABLET (75 MG TOTAL) BY MOUTH AT BEDTIME.        Allergies:  Allergies  Allergen Reactions   Janumet Xr [Sitagliptin-Metformin Hcl Er] Rash    Tolerates the plain Januvia    Jardiance [Empagliflozin] Rash   Metformin And Related Diarrhea   Methotrexate Derivatives Other (See Comments)    transaminitis   Other Rash    Methyldibromo Gluteronitrile - skin product preservative    Past Medical History:  Diagnosis Date   Alcohol use 11/08/2015   Anxiety    Aortic aneurysm (HCC)    Diabetes mellitus without complication ([place]) 1448   Daivd Fredericksen   GERD (gastroesophageal reflux disease)    Herpes labialis    History of chicken pox    Hyperlipidemia    Kidney stone     Past Surgical History:  Procedure Laterality Date   ABDOMINAL AORTOGRAM W/LOWER EXTREMITY N/A 07/22/2019   Procedure: ABDOMINAL AORTOGRAM W/LOWER EXTREMITY;  Surgeon: Elam Dutch, MD;  Location: [facility];  Service: Cardiovascular;  Laterality: N/A;   APPENDECTOMY     FINGER SURGERY Right    2 screws in ring finger   PERIPHERAL VASCULAR INTERVENTION Left 07/22/2019   Procedure: PERIPHERAL VASCULAR INTERVENTION;  Surgeon: Elam Dutch, MD;  Location: [facility];  Service: Cardiovascular;  Laterality: Left;   TONSILLECTOMY      Family  History  Problem Relation Age of Onset   Diabetes Mother    Parkinsonism Mother 85       progressive supranuclear palsy, deceased   CAD Father 40       MI at 70, CABG at 45   Diabetes Maternal Grandmother    Stroke Neg Hx    Cancer Neg Hx    Colon cancer Neg Hx    Esophageal cancer Neg Hx    Rectal cancer Neg Hx    Stomach cancer Neg Hx     Social  History:  reports that he quit smoking about 3 years ago. His smoking use included cigarettes. He has a 7.50 pack-year smoking history. He has never been exposed to tobacco smoke. He has never used smokeless tobacco. He reports that he does not currently use alcohol. He reports that he does not currently use drugs.    Review of Systems    Lipid history:  Has been on 80 mg simvastatin for treatment, prescribed by PCP  His lipids are generally well controlled    Lab Results  Component Value Date   CHOL 148 03/24/2022   HDL 48.00 03/24/2022   LDLCALC 84 03/24/2022   LDLDIRECT 79.0 04/14/2022   TRIG 80.0 03/24/2022   CHOLHDL 3 03/24/2022         Lab Results  Component Value Date   ALT 17 12/23/2021    He has been refusing to take the Covid vaccine   History of atopic dermatitis  Blood pressure is consistently normal, currently on 50 mg losartan prescribed by PCP   BP Readings from Last 3 Encounters:  04/16/22 122/60  03/24/22 122/70  12/26/21 122/74     Review of Systems  Most recent eye exam: 09/2021  Most recent foot exam: 7/21  No numbness or tingling in his feet    Physical Examination:  BP 122/60   Pulse (!) 53   Ht _0  (1.676 m)   Wt 186 lb (84.4 kg)   SpO2 99%   BMI 30.02 kg/m    ASSESSMENT:  Diabetes type 2, non-insulin-dependent  See history of present illness for detailed discussion of current diabetes management, blood sugar patterns and problems identified  His A1c is back up to 7.8 Also GMI on his Dexcom sensor is 7.4  He is on glipizide ER 10 mg, Farxiga, basal insulin and Actos  As discussed above his main difficulty is hyperglycemia after meals and more significantly after lunch compared to last visit Also tending to gain weight especially with decreased activity recently  HYPERTENSION: His blood pressure is well-controlled  PLAN:    He will reduce glipizide to 5 mg to avoid tendency to low sugars overnight Also may consider  reducing dose of Actos to prevent further weight gain And needs to start covering his lunch or any other large meals with rapid acting insulin to correct postprandial hyperglycemia Have discussed in detail the need for mealtime insulin to cover postprandial spikes, action of mealtime insulin, use of the insulin pen, timing and action of the rapid acting insulin as well as starting dose and dosage titration to target the two-hour reading of under 180 He will start with 8 units of NOVOLOG or what ever is preferred by his insurance Details of this were given on the instruction sheet for postprandial hyperglycemia Discussed that this will need to be adjusted based on his blood sugars about 1 to 2 hours later He will continue the same dose of Basaglar at night Hopefully  he can start being more physically active once his back pain has resolved Needs to cut back on fast food and high fat foods like [place] fries and also completely avoid regular soft drinks Consultation with diabetes educator to help adjust his insulin and diet  There are no Patient Instructions on file for this visit.  Total visit time including counseling = 30 minutes    Elayne Snare 04/16/2022, 8:49 PM   Note: This office note was prepared with Dragon voice recognition system technology. Any transcriptional errors that result from this process are unintentional.

## 2022-04-17 DIAGNOSIS — M9905 Segmental and somatic dysfunction of pelvic region: Secondary | ICD-10-CM | POA: Diagnosis not present

## 2022-04-17 DIAGNOSIS — M9904 Segmental and somatic dysfunction of sacral region: Secondary | ICD-10-CM | POA: Diagnosis not present

## 2022-04-17 DIAGNOSIS — M9903 Segmental and somatic dysfunction of lumbar region: Secondary | ICD-10-CM | POA: Diagnosis not present

## 2022-04-17 DIAGNOSIS — M9901 Segmental and somatic dysfunction of cervical region: Secondary | ICD-10-CM | POA: Diagnosis not present

## 2022-04-21 DIAGNOSIS — M9905 Segmental and somatic dysfunction of pelvic region: Secondary | ICD-10-CM | POA: Diagnosis not present

## 2022-04-21 DIAGNOSIS — M9903 Segmental and somatic dysfunction of lumbar region: Secondary | ICD-10-CM | POA: Diagnosis not present

## 2022-04-21 DIAGNOSIS — M9901 Segmental and somatic dysfunction of cervical region: Secondary | ICD-10-CM | POA: Diagnosis not present

## 2022-04-21 DIAGNOSIS — M9904 Segmental and somatic dysfunction of sacral region: Secondary | ICD-10-CM | POA: Diagnosis not present

## 2022-04-22 ENCOUNTER — Other Ambulatory Visit: Payer: Self-pay | Admitting: Endocrinology

## 2022-04-22 ENCOUNTER — Encounter: Payer: Self-pay | Admitting: Endocrinology

## 2022-04-22 DIAGNOSIS — E1165 Type 2 diabetes mellitus with hyperglycemia: Secondary | ICD-10-CM

## 2022-04-24 DIAGNOSIS — M9904 Segmental and somatic dysfunction of sacral region: Secondary | ICD-10-CM | POA: Diagnosis not present

## 2022-04-24 DIAGNOSIS — M9901 Segmental and somatic dysfunction of cervical region: Secondary | ICD-10-CM | POA: Diagnosis not present

## 2022-04-24 DIAGNOSIS — M9905 Segmental and somatic dysfunction of pelvic region: Secondary | ICD-10-CM | POA: Diagnosis not present

## 2022-04-24 DIAGNOSIS — M9903 Segmental and somatic dysfunction of lumbar region: Secondary | ICD-10-CM | POA: Diagnosis not present

## 2022-04-30 DIAGNOSIS — M9904 Segmental and somatic dysfunction of sacral region: Secondary | ICD-10-CM | POA: Diagnosis not present

## 2022-04-30 DIAGNOSIS — M9905 Segmental and somatic dysfunction of pelvic region: Secondary | ICD-10-CM | POA: Diagnosis not present

## 2022-04-30 DIAGNOSIS — M9901 Segmental and somatic dysfunction of cervical region: Secondary | ICD-10-CM | POA: Diagnosis not present

## 2022-04-30 DIAGNOSIS — M9903 Segmental and somatic dysfunction of lumbar region: Secondary | ICD-10-CM | POA: Diagnosis not present

## 2022-05-07 DIAGNOSIS — M9903 Segmental and somatic dysfunction of lumbar region: Secondary | ICD-10-CM | POA: Diagnosis not present

## 2022-05-07 DIAGNOSIS — M9901 Segmental and somatic dysfunction of cervical region: Secondary | ICD-10-CM | POA: Diagnosis not present

## 2022-05-07 DIAGNOSIS — M9904 Segmental and somatic dysfunction of sacral region: Secondary | ICD-10-CM | POA: Diagnosis not present

## 2022-05-07 DIAGNOSIS — M9905 Segmental and somatic dysfunction of pelvic region: Secondary | ICD-10-CM | POA: Diagnosis not present

## 2022-05-12 ENCOUNTER — Other Ambulatory Visit: Payer: Self-pay | Admitting: Endocrinology

## 2022-05-14 DIAGNOSIS — M9903 Segmental and somatic dysfunction of lumbar region: Secondary | ICD-10-CM | POA: Diagnosis not present

## 2022-05-14 DIAGNOSIS — M9905 Segmental and somatic dysfunction of pelvic region: Secondary | ICD-10-CM | POA: Diagnosis not present

## 2022-05-14 DIAGNOSIS — M9904 Segmental and somatic dysfunction of sacral region: Secondary | ICD-10-CM | POA: Diagnosis not present

## 2022-05-14 DIAGNOSIS — M9901 Segmental and somatic dysfunction of cervical region: Secondary | ICD-10-CM | POA: Diagnosis not present

## 2022-05-22 ENCOUNTER — Other Ambulatory Visit: Payer: Self-pay | Admitting: Family Medicine

## 2022-05-22 DIAGNOSIS — K219 Gastro-esophageal reflux disease without esophagitis: Secondary | ICD-10-CM

## 2022-05-23 ENCOUNTER — Encounter: Payer: 59 | Attending: Physical Medicine & Rehabilitation | Admitting: Physical Medicine & Rehabilitation

## 2022-05-23 ENCOUNTER — Encounter: Payer: Self-pay | Admitting: Physical Medicine & Rehabilitation

## 2022-05-23 VITALS — BP 138/77 | HR 64 | Ht 66.0 in | Wt 186.0 lb

## 2022-05-23 DIAGNOSIS — M533 Sacrococcygeal disorders, not elsewhere classified: Secondary | ICD-10-CM | POA: Diagnosis not present

## 2022-05-23 DIAGNOSIS — G8929 Other chronic pain: Secondary | ICD-10-CM | POA: Diagnosis not present

## 2022-05-23 DIAGNOSIS — F411 Generalized anxiety disorder: Secondary | ICD-10-CM | POA: Diagnosis not present

## 2022-05-23 DIAGNOSIS — M545 Low back pain, unspecified: Secondary | ICD-10-CM | POA: Diagnosis not present

## 2022-05-23 DIAGNOSIS — R69 Illness, unspecified: Secondary | ICD-10-CM | POA: Diagnosis not present

## 2022-05-23 MED ORDER — METHOCARBAMOL 750 MG PO TABS: 750.0000 mg | ORAL_TABLET | Freq: Three times a day (TID) | ORAL | 1 refills | Status: DC | PRN

## 2022-05-23 NOTE — Progress Notes (Signed)
Subjective:    Patient ID: Andrew Hart, male    DOB: [birth year], 52 y.o.   MRN: [id number]  HPI Andrew Hart is a 52 y.o. year old male  who  has a past medical history of Alcohol use (11/08/2015), Anxiety, Aortic aneurysm (HCC), Diabetes mellitus without complication (HCC) (2006), GERD (gastroesophageal reflux disease), Herpes labialis, History of chicken pox, Hyperlipidemia, and Kidney stone.   They are presenting to PM&R clinic as a new patient for pain management evaluation. They were referred  for treatment of left SI joint chronic pain.  He reports that he developed pain in his back about a year and a half ago.  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  Pain is worsened by activities and improved with rest.  Heat also helps this pain.  He was seen by his PCP who noted that he had pain at his left SI joint.  He sees a Land.  Occasionally the pain will shoot across his left anterior hip.  Red flag symptoms: Patient denies saddle anesthesia, loss of bowel or bladder continence, new weakness, new numbness/tingling, or pain waking up at nighttime.  Medications tried: Ibuprofen helps a little Tylenol- helps a little  Muscle relaxer?- minimal help Takes celexa for anxiety  Other treatments: Home exercise work on Psychiatric nurse, minimal improvement Year and half pain in SI joint    Resting  Home therapy flexibility, helps Shoots across hip    Pain Inventory Average Pain 6 Pain Right Now 4 My pain is intermittent, dull, and aching  In the last 24 hours, has pain interfered with the following? General activity 5 Relation with others 2 Enjoyment of life 8 What TIME of day is your pain at its worst? evening Sleep (in general) Fair  Pain is worse with: bending Pain improves with: rest, heat/ice, and medication Relief from Meds: 9  how many minutes can you walk? 10 ability to climb steps?  yes do you drive?  yes  employed #  of hrs/week 40  anxiety  Any changes since last visit?  no    Family History  Problem Relation Age of Onset   Diabetes Mother    Parkinsonism Mother 16       progressive supranuclear palsy, deceased   CAD Father 71       MI at 20, CABG at 50   Diabetes Maternal Grandmother    Stroke Neg Hx    Cancer Neg Hx    Colon cancer Neg Hx    Esophageal cancer Neg Hx    Rectal cancer Neg Hx    Stomach cancer Neg Hx    Social History   Socioeconomic History   Marital status: Married    Spouse name: Not on file   Number of children: Not on file   Years of education: Not on file   Highest education level: Not on file  Occupational History   Not on file  Tobacco Use   Smoking status: Former    Packs/day: 0.50    Years: 15.00    Total pack years: 7.50    Types: Cigarettes    Quit date: 02/03/2019    Years since quitting: 3.3    Passive exposure: Never   Smokeless tobacco: Never  Vaping Use   Vaping Use: Never used  Substance and Sexual Activity   Alcohol use: Not Currently    Alcohol/week: 0.0 standard drinks of alcohol    Comment: Regular beer (12  pk/wkend)   Drug use: Not Currently    Comment: MJ (occasional)   Sexual activity: Not on file  Other Topics Concern   Not on file  Social History Narrative   Lives with wife and 2 children (daughter 2003, son 20)    Occ: owns Education administrator business    Activity: active at work, at home on yard   Diet: some water, fruits/vegetables daily    Social Determinants of Health   Financial Resource Strain: Not on file  Food Insecurity: Not on file  Transportation Needs: Not on file  Physical Activity: Not on file  Stress: Not on file  Social Connections: Not on file   Past Surgical History:  Procedure Laterality Date   ABDOMINAL AORTOGRAM W/LOWER EXTREMITY N/A 07/22/2019   Procedure: ABDOMINAL AORTOGRAM W/LOWER EXTREMITY;  Surgeon: Elam Dutch, MD;  Location: [facility];  Service: Cardiovascular;  Laterality: N/A;    APPENDECTOMY     FINGER SURGERY Right    2 screws in ring finger   PERIPHERAL VASCULAR INTERVENTION Left 07/22/2019   Procedure: PERIPHERAL VASCULAR INTERVENTION;  Surgeon: Elam Dutch, MD;  Location: [facility];  Service: Cardiovascular;  Laterality: Left;   TONSILLECTOMY     Past Medical History:  Diagnosis Date   Alcohol use 11/08/2015   Anxiety    Aortic aneurysm ([place])    Diabetes mellitus without complication ([place]) 8315   Kumar   GERD (gastroesophageal reflux disease)    Herpes labialis    History of chicken pox    Hyperlipidemia    Kidney stone    Ht 5\' 6"  (1.676 m)   BMI 30.02 kg/m   Opioid Risk Score:   Fall Risk Score:  `1  Depression screen [facility] 2/9     03/24/2022   10:52 AM 03/24/2022   10:21 AM 03/20/2021    9:38 AM 03/19/2020    9:35 AM 03/14/2019   10:45 AM 01/18/2018    9:35 AM 07/30/2017   10:00 AM  Depression screen PHQ 2/9  Decreased Interest 1 0 0 0 0 0 1  Down, Depressed, Hopeless 1 0 0 1 0 0 1  PHQ - 2 Score 2 0 0 1 0 0 2  Altered sleeping 0  1 0   2  Tired, decreased energy 1  1 1   2   Change in appetite 0  1 0   2  Feeling bad or failure about yourself  1  0 0   1  Trouble concentrating 0  1 0   1  Moving slowly or fidgety/restless 0  0 0   0  Suicidal thoughts 0  0 0   0  PHQ-9 Score 4  4 2   10       Review of Systems  Musculoskeletal:  Positive for back pain.  All other systems reviewed and are negative.     Objective:   Physical Exam  Gen: no distress, normal appearing HEENT: oral mucosa pink and moist, NCAT Cardio: Reg rate Chest: normal effort, normal rate of breathing Abd: soft, non-distended Ext: no edema Psych: pleasant, normal affect Skin: intact Neuro: Alert and oriented, follows commands, cranial nerves II through XII intact, normal speech and language Sensation intact light touch in all 4 extremities Strength 5 out of 5 in all 4 extremities No ataxia or dysmetria Musculoskeletal:  Tenderness over left  SI joint Minimal tenderness paraspinal muscles of the lumbar spine, mostly on the left SLR negative Facet loading negative  Spurling's negative FABER mildly positive on the left around SI joint FADIR mildly positive on the left SI/iliac compression negative Gaenslen's negative bilaterally Yeomans positive on the left Slightly decreased L-spine motion in all directions     Assessment & Plan:   Chronic lower back pain -Suspect SI joint dysfunction on left -I think he may also be having some tenderness of his piriformis and this could be associated with SI joint dysfunction -Refer to Dr. Letta Pate for SI joint injection left -Robaxin 750mg  TID prn ordered -Consider Tens unit -Consider PT referral if pain not improved  Anxiety -He is on celexa, could consider duloxetine for pain however would have to dc celexa first

## 2022-05-23 NOTE — Telephone Encounter (Signed)
Too soon. Rx sent on 03/24/22, #90/3 to [facility].

## 2022-05-24 ENCOUNTER — Encounter: Payer: Self-pay | Admitting: Physical Medicine & Rehabilitation

## 2022-05-30 ENCOUNTER — Encounter: Payer: 59 | Attending: Family Medicine | Admitting: Dietician

## 2022-05-30 ENCOUNTER — Encounter: Payer: Self-pay | Admitting: Dietician

## 2022-05-30 VITALS — Ht 69.0 in | Wt 186.0 lb

## 2022-05-30 DIAGNOSIS — E1169 Type 2 diabetes mellitus with other specified complication: Secondary | ICD-10-CM | POA: Diagnosis not present

## 2022-05-30 DIAGNOSIS — Z794 Long term (current) use of insulin: Secondary | ICD-10-CM | POA: Insufficient documentation

## 2022-05-30 NOTE — Patient Instructions (Addendum)
Google Dexcom G7 overpatches and try them to see if the adhesive is less irritating to your skin.  Calorie [employer] taking the Novolog consistently before you eat.  Take less if you are not having many carbohydrates (smaller meal) such as a salad.    Decrease your fat/saturated fat.  Bake or air fry rather than fry. Mindfulness when you eat out. Stay active every day.  Rethink what you drink

## 2022-05-30 NOTE — Progress Notes (Signed)
Diabetes Self-Management Education  Visit Type: First/Initial  Appt. Start Time: 1520 Appt. End Time: 1650  05/30/2022  [name], identified by name and date of birth, is a 52 y.o. male with a diagnosis of Diabetes: Type 2.   ASSESSMENT Patient is here today alone.  He last had diabetes education about 20 years ago when he was diagnosed.  He states that he is now on injections, hates it and is burned out on it. Complains that the overpatch that comes with the Dexcom [place] irritating to his skin. Going to get an injection into his back in February.  Discussed that this likely will increase his blood glucose.   Dexcom currently 125 sensor reading.  Goal is to keep his Dexcom from alarming. Sleeps well with Trazodone. Would like to lose weight.  Referral Dx:  Type 2 Diabetes, quit smoking 2020, GERD, HLD, CKD History includes:  Type 2 Diabetes (2004) Labs:  A1C 7.8% 04/14/2022 increased from 7.2% 12/23/2021, GFR 80 04/14/2022, Vitamin B-12 544 03/2021 Medications include:  glipizide, Actos, Basaglar 20 units q HS, Novolog 8 units before meals (forgets frequently and does not take a pen with him when he eats out) plus sliding scale, Farxiga CGM:  Dexcom G7  CGM Results from download: 05/30/22  % Time CGM active:   71 %   (Goal >70%)  Average glucose:   167 mg/dL for 14 days  Glucose management indicator:   N/A %  Time in range (70-180 mg/dL):   62 %   (Goal >16%)  Time High (181-250 mg/dL):   27 %   (Goal < 10%)  Time Very High (>250 mg/dL):    10 %   (Goal < 5%)  Time Low (54-69 mg/dL):   1 %   (Goal <9%)  Time Very Low (<54 mg/dL):   0 %   (Goal <6%)   Weight hx: 69" 186 lbs 05/30/2022 186 lbs 04/16/2022 gained weight when he went of Invokana 180 lbs 03/24/2022 170 lbs 05/2021 150 lbs 05/2020  Concerned about low weight at that time and tried to gain weight  Patient lives with his 59 yo son and wife.  He does the shopping and cooking. He cleans carpet for a living.    Back pain improved but present.   No significant exercise except for work.  Height 5\' 9"  (1.753 m), weight 186 lb (84.4 kg). Body mass index is 27.47 kg/m.   Diabetes Self-Management Education - 05/30/22 1558       Visit Information   Visit Type First/Initial      Initial Visit   Diabetes Type Type 2    Date Diagnosed 2004    Are you currently following a meal plan? No    Are you taking your medications as prescribed? No      Health Coping   How would you rate your overall health? Fair      Psychosocial Assessment   Patient Belief/Attitude about Diabetes Defeat/Burnout    What is the hardest part about your diabetes right now, causing you the most concern, or is the most worrisome to you about your diabetes?   Taking/obtaining medications;Being active;Making healty food and beverage choices    Self-care barriers None    Self-management support Doctor's office    Other persons present Patient    Patient Concerns Nutrition/Meal planning;Glycemic Control    Special Needs None    Preferred Learning Style No preference indicated    Learning Readiness Ready  How often do you need to have someone help you when you read instructions, pamphlets, or other written materials from your doctor or pharmacy? 1 - Never    What is the last grade level you completed in school? 2 years college      Pre-Education Assessment   Patient understands the diabetes disease and treatment process. Needs Review    Patient understands incorporating nutritional management into lifestyle. Needs Review    Patient undertands incorporating physical activity into lifestyle. Needs Review    Patient understands using medications safely. Needs Review    Patient understands monitoring blood glucose, interpreting and using results Needs Review    Patient understands prevention, detection, and treatment of acute complications. Needs Review    Patient understands prevention, detection, and treatment of chronic  complications. Needs Review    Patient understands how to develop strategies to address psychosocial issues. Needs Review    Patient understands how to develop strategies to promote health/change behavior. Needs Review      Complications   Last HgB A1C per patient/outside source 7.8 %   04/14/2022   How often do you check your blood sugar? > 4 times/day    Fasting Blood glucose range (mg/dL) 70-129    Postprandial Blood glucose range (mg/dL) 180-200;>200    Number of hypoglycemic episodes per month 1    Can you tell when your blood sugar is low? Yes    What do you do if your blood sugar is low? drinks soda    Number of hyperglycemic episodes ( >200mg /dL): Daily    Can you tell when your blood sugar is high? Yes    What do you do if your blood sugar is high? drinks water, relax    Have you had a dilated eye exam in the past 12 months? Yes    Have you had a dental exam in the past 12 months? Yes    Are you checking your feet? Yes    How many days per week are you checking your feet? 4      Dietary Intake   Breakfast bacon, egg, cheese sandwich on White wheat bread OR 1/2 waffle, eggs, bacon, grits OR breakfast bar    Snack (morning) none    Lunch burger or chicken sandwich and fries    Snack (afternoon) none unless he misses lunch - chips    Licensed conveyancer helper OR tacos OR steakum on hogie roll OR pizza    Snack (evening) occasional ice cream and oreo's    Beverage(s) water, diet soda, unsweetened tea, flavored water, regular soda or juice when blood glucose is low, beer (6 pack on a weekend)      Activity / Exercise   Activity / Exercise Type Light (walking / raking leaves)   work related only     Patient Education   Previous Diabetes Education Yes (please comment)   20 years ago when diagnosed   Disease Pathophysiology Definition of diabetes, type 1 and 2, and the diagnosis of diabetes;Factors that contribute to the development of diabetes    Healthy Eating Role of diet in the  treatment of diabetes and the relationship between the three main macronutrients and blood glucose level;Food label reading, portion sizes and measuring food.;Plate Method;Information on hints to eating out and maintain blood glucose control.;Meal options for control of blood glucose level and chronic complications.;Carbohydrate counting;Meal timing in regards to the patients' current diabetes medication.;Effects of alcohol on blood glucose and safety factors with consumption of alcohol.  Being Active Role of exercise on diabetes management, blood pressure control and cardiac health.;Helped patient identify appropriate exercises in relation to his/her diabetes, diabetes complications and other health issue.    Medications Reviewed patients medication for diabetes, action, purpose, timing of dose and side effects.;Taught/reviewed insulin/injectables, injection, site rotation, insulin/injectables storage and needle disposal.    Monitoring Daily foot exams;Yearly dilated eye exam;Taught/evaluated CGM (comment);Identified appropriate SMBG and/or A1C goals.    Acute complications Trained/discussed glucagon administration to patient and designated other.;Taught prevention, symptoms, and  treatment of hypoglycemia - the 15 rule.    Diabetes Stress and Support Identified and addressed patients feelings and concerns about diabetes;Role of stress on diabetes;Worked with patient to identify barriers to care and solutions      Individualized Goals (developed by patient)   Nutrition General guidelines for healthy choices and portions discussed    Physical Activity Exercise 3-5 times per week;30 minutes per day    Medications take my medication as prescribed    Monitoring  Consistenly use CGM    Problem Solving Eating Pattern;Medication consistency;Addressing barriers to behavior change    Reducing Risk examine blood glucose patterns      Post-Education Assessment   Patient understands the diabetes disease and  treatment process. Comprehends key points    Patient understands incorporating nutritional management into lifestyle. Comprehends key points    Patient undertands incorporating physical activity into lifestyle. Comprehends key points    Patient understands using medications safely. Comphrehends key points    Patient understands monitoring blood glucose, interpreting and using results Comprehends key points    Patient understands prevention, detection, and treatment of acute complications. Comprehends key points    Patient understands prevention, detection, and treatment of chronic complications. Comprehends key points    Patient understands how to develop strategies to address psychosocial issues. Comprehends key points    Patient understands how to develop strategies to promote health/change behavior. Comprehends key points      Outcomes   Expected Outcomes Demonstrated interest in learning. Expect positive outcomes    Future DMSE PRN    Program Status Completed             Individualized Plan for Diabetes Self-Management Training:   Learning Objective:  Patient will have a greater understanding of diabetes self-management. Patient education plan is to attend individual and/or group sessions per assessed needs and concerns.   Plan:   Patient Instructions  Google Dexcom G7 overpatches and try them to see if the adhesive is less irritating to your skin.  Calorie [employer] taking the Novolog consistently before you eat.  Take less if you are not having many carbohydrates (smaller meal) such as a salad.    Decrease your fat/saturated fat.  Bake or air fry rather than fry. Mindfulness when you eat out. Stay active every day.  Rethink what you drink   Expected Outcomes:  Demonstrated interest in learning. Expect positive outcomes  Education material provided: ADA - How to Thrive: A Guide for Your Journey with Diabetes, Food label handouts, Meal plan card, Snack sheet, and  Diabetes Resources, dining out tips  If problems or questions, patient to contact team via:  Phone  Future DSME appointment: PRN

## 2022-06-25 ENCOUNTER — Telehealth: Payer: Self-pay

## 2022-06-25 NOTE — Telephone Encounter (Signed)
Called Andrew Hart to advise that SI injection was denied and he could still keep his appointment and just have it changed to a consult with Dr. Letta Pate, patient advised he would like to know how much the procedure would cost and then I said I could get him to the front for a price, he then advised that he would just like to go ahead and cancel his appointment altogether for the back injection instead.

## 2022-06-26 ENCOUNTER — Encounter: Payer: 59 | Admitting: Physical Medicine & Rehabilitation

## 2022-06-30 ENCOUNTER — Other Ambulatory Visit (INDEPENDENT_AMBULATORY_CARE_PROVIDER_SITE_OTHER): Payer: 59

## 2022-06-30 DIAGNOSIS — Z794 Long term (current) use of insulin: Secondary | ICD-10-CM

## 2022-06-30 DIAGNOSIS — E1165 Type 2 diabetes mellitus with hyperglycemia: Secondary | ICD-10-CM | POA: Diagnosis not present

## 2022-06-30 LAB — BASIC METABOLIC PANEL
BUN: 19 mg/dL (ref 6–23)
CO2: 27 mEq/L (ref 19–32)
Calcium: 9.4 mg/dL (ref 8.4–10.5)
Chloride: 102 mEq/L (ref 96–112)
Creatinine, Ser: 1.09 mg/dL (ref 0.40–1.50)
GFR: 78.09 mL/min (ref >60.00)
Glucose, Bld: 136 mg/dL — ABNORMAL HIGH (ref 70–99)
Potassium: 4.1 mEq/L (ref 3.5–5.1)
Sodium: 138 mEq/L (ref 135–145)

## 2022-06-30 LAB — HEMOGLOBIN A1C: Hgb A1c MFr Bld: 7.4 % — ABNORMAL HIGH (ref 4.6–6.5)

## 2022-07-02 ENCOUNTER — Encounter: Payer: Self-pay | Admitting: Endocrinology

## 2022-07-02 ENCOUNTER — Ambulatory Visit: Payer: 59 | Admitting: Endocrinology

## 2022-07-02 VITALS — BP 116/70 | HR 53 | Ht 69.0 in | Wt 184.0 lb

## 2022-07-02 DIAGNOSIS — I10 Essential (primary) hypertension: Secondary | ICD-10-CM

## 2022-07-02 DIAGNOSIS — E1165 Type 2 diabetes mellitus with hyperglycemia: Secondary | ICD-10-CM

## 2022-07-02 DIAGNOSIS — Z794 Long term (current) use of insulin: Secondary | ICD-10-CM | POA: Diagnosis not present

## 2022-07-02 NOTE — Patient Instructions (Addendum)
Take 18 Basaglar  Take 4-6 units at supper daily  Stop Glipizide

## 2022-07-02 NOTE — Progress Notes (Signed)
Patient ID: Andrew Hart, male   DOB: 12-Aug-1970, 52 y.o.   MRN: [id number]           Reason for Appointment: Follow-up for Type 2 Diabetes    History of Present Illness:          Date of diagnosis of type 2 diabetes mellitus: 2007 ?        Background history:  He was diagnosed with routine screening lab from his work He was initially tried on metformin but this caused diarrhea and he could not continue this Subsequently was treated with glipizide and apparently this was able to control his blood sugars fairly well for a few years Probably in early 2016 he was given Actos in addition to help his glucose control as A1c had gone up to 9.4 This helped transiently and A1c was 7.7 However his A1c had been otherwise over 8% since mid 2015 Because of  high blood sugars on his initial consultation in 05/2014 (A1c was 9.1) he was started on Invokana and Onglyza in addition to his Actos and glipizide.   Recent history:   Non-insulin hypoglycemic drugs: glipizide ER 5 mg daily, Actos 45 mg daily, Farxiga 10 mg daily  Insulin dose: Basaglar 20 units at bedtime, NovoLog 4 to 8 units at lunch  His A1c is 7.4, slightly better  Lowest A1c on record 6.9  Fructosamine last 315   Current blood sugar patterns and problems identified: He has started using the NovoLog as directed on his last visit but he is finding it difficult to take it consistently At times he will remember to take it before eating and sometimes it will be after eating and may have missed some doses also However with this his blood sugars are somewhat better with time in range 67 compared to 61% Currently having sporadic high readings after all meals and appears to be having more consistently high readings after dinner or late at night Again blood sugars may fluctuate based on his diet especially with fast food at times Also despite Basaglar his overnight readings are quite variable Likely with his increased activity during the  day he may have some low normal sugars midmorning and late afternoon also His weight is however down 2 pounds compared to a 6 pound weight gain in the last visit He is asking about reducing his non-insulin drugs, glipizide was lowered previously        Side effects from medications have been: Diarrhea from regular metformin, nausea and constipation from Rybelsus, nausea from Trulicity, nausea and body aches from [facility]  Interpretation of G7 Dexcom download for the last 2 weeks as follows  Overnight blood sugars are averaging nearly 200 around midnight and then progressively declining to a variable extent by morning with lowest averaging between 6-8 AM down to 124  No hypoglycemia seen except low normal readings overnight POSTPRANDIAL readings sporadically higher after all meals Generally blood sugars are on an average the highest after dinner averaging about 198 around midnight Day-to-day variability is present    CGM use % of time   2-week average/GV 158/35  Time in range 67      %  % Time Above 180 25  % Time above 250 7  % Time Below 70 1   Previous data:  CGM use % of time 93  2-week average/GV 170/36  Time in range      61  %  % Time Above 180   % Time above 250   %  Time Below 70      PRE-MEAL Fasting Lunch Dinner Bedtime Overall  Glucose range:       Averages: 124   188    POST-MEAL PC Breakfast PC Lunch PC Dinner  Glucose range:     Averages:  224     Dietician visit, most recent: 06/2015  Weight history:   Wt Readings from Last 3 Encounters:  07/02/22 184 lb (83.5 kg)  05/30/22 186 lb (84.4 kg)  05/23/22 186 lb (84.4 kg)    Glycemic control:    Lab Results  Component Value Date   HGBA1C 7.4 (H) 06/30/2022   HGBA1C 7.8 (H) 04/14/2022   HGBA1C 7.2 (H) 12/23/2021   Lab Results  Component Value Date   MICROALBUR <0.7 12/23/2021   LDLCALC 84 03/24/2022   CREATININE 1.09 06/30/2022    Lab on 06/30/2022  Component Date Value Ref Range Status    Sodium 06/30/2022 138  135 - 145 mEq/L Final   Potassium 06/30/2022 4.1  3.5 - 5.1 mEq/L Final   Chloride 06/30/2022 102  96 - 112 mEq/L Final   CO2 06/30/2022 27  19 - 32 mEq/L Final   Glucose, Bld 06/30/2022 136 (H)  70 - 99 mg/dL Final   BUN 06/30/2022 19  6 - 23 mg/dL Final   Creatinine, Ser 06/30/2022 1.09  0.40 - 1.50 mg/dL Final   GFR 06/30/2022 78.09  >60.00 mL/min Final   Calculated using the CKD-EPI Creatinine Equation (2021)   Calcium 06/30/2022 9.4  8.4 - 10.5 mg/dL Final   Hgb A1c MFr Bld 06/30/2022 7.4 (H)  4.6 - 6.5 % Final   Glycemic Control Guidelines for People with Diabetes:Non Diabetic:  <6%Goal of Therapy: <7%Additional Action Suggested:  >8%       Allergies as of 07/02/2022       Reactions   Janumet Xr [sitagliptin-metformin Hcl Er] Rash   Tolerates the plain Januvia    Jardiance [empagliflozin] Rash   Metformin And Related Diarrhea   Methotrexate Derivatives Other (See Comments)   transaminitis   Other Rash   Methyldibromo Gluteronitrile - skin product preservative        Medication List        Accurate as of July 02, 2022  4:20 PM. If you have any questions, ask your nurse or doctor.          acetaminophen 500 MG tablet Commonly known as: TYLENOL Take 500 mg by mouth every 6 (six) hours as needed.   acyclovir 200 MG capsule Commonly known as: ZOVIRAX TAKE 1 CAPSULE BY MOUTH 3 TIMES DAILY FOR 3 TO 5 DAYS AS NEEDED.   aspirin EC 81 MG tablet Take 1 tablet (81 mg total) by mouth every Monday, Wednesday, and Friday.   B-12 1000 MCG Subl Place 1 tablet under the tongue daily.   Basaglar KwikPen 100 UNIT/ML START WITH 6 UNITS, ADJUST UP TO 20 UNITS AT NIGHT DAILY AS DIRECTED   Bayer Microlet Lancets lancets Use as instructed to check blood sugar twice a day dx E11.65   busPIRone 15 MG tablet Commonly known as: BUSPAR Take 1 tablet (15 mg total) by mouth 2 (two) times daily.   cholecalciferol 25 MCG (1000 UNIT) tablet Commonly  known as: VITAMIN D3 Take 1,000 Units by mouth daily.   citalopram 20 MG tablet Commonly known as: CELEXA Take 1 tablet (20 mg total) by mouth daily.   Contour Next Monitor w/Device Kit CHECK BLOOD SUGAR TWICE DAILY   Dexcom G7 Sensor  Misc CHANGE EVERY 10 DAYS   dupilumab 300 MG/2ML prefilled syringe Commonly known as: DUPIXENT Inject 300 mg into the skin every 14 (fourteen) days.   Farxiga 10 MG Tabs tablet Generic drug: dapagliflozin propanediol TAKE 1 TABLET (10 MG TOTAL) BY MOUTH DAILY BEFORE BREAKFAST.   glipiZIDE 5 MG 24 hr tablet Commonly known as: GLUCOTROL XL TAKE 1 TABLET BY MOUTH DAILY WITH BREAKFAST.   halobetasol 0.05 % cream Commonly known as: ULTRAVATE Apply 1 application topically 2 (two) times daily. Can cover with Saran wrap fo r30 min.  DO NOT apply to face/groin.   HYDROcodone-acetaminophen 5-325 MG tablet Commonly known as: NORCO/VICODIN Take 1 tablet by mouth 3 (three) times daily as needed for moderate pain (sedation precautions).   hydrOXYzine 25 MG tablet Commonly known as: ATARAX Take 25 mg by mouth 3 (three) times daily as needed for itching.   ibuprofen 200 MG tablet Commonly known as: ADVIL Take 400 mg by mouth every 6 (six) hours as needed for headache or moderate pain.   losartan 50 MG tablet Commonly known as: COZAAR Take 1 tablet (50 mg total) by mouth daily.   methocarbamol 750 MG tablet Commonly known as: Robaxin-750 Take 1 tablet (750 mg total) by mouth every 8 (eight) hours as needed for muscle spasms.   NovoLOG FlexPen 100 UNIT/ML FlexPen Generic drug: insulin aspart Inject 12 Units into the skin 3 (three) times daily with meals.   OneTouch Verio test strip Generic drug: glucose blood Use as instructed   pantoprazole 40 MG tablet Commonly known as: PROTONIX Take 1 tablet (40 mg total) by mouth daily.   Pen Needles 32G X 4 MM Misc Use to inject insulin 2x daily into skin   pioglitazone 45 MG tablet Commonly known  as: ACTOS Take 1 tablet (45 mg total) by mouth daily.   simvastatin 80 MG tablet Commonly known as: ZOCOR Take 1 tablet (80 mg total) by mouth daily.   traZODone 150 MG tablet Commonly known as: DESYREL TAKE 1/2 TABLET (75 MG TOTAL) BY MOUTH AT BEDTIME.        Allergies:  Allergies  Allergen Reactions   Janumet Xr [Sitagliptin-Metformin Hcl Er] Rash    Tolerates the plain Januvia    Jardiance [Empagliflozin] Rash   Metformin And Related Diarrhea   Methotrexate Derivatives Other (See Comments)    transaminitis   Other Rash    Methyldibromo Gluteronitrile - skin product preservative    Past Medical History:  Diagnosis Date   Alcohol use 11/08/2015   Anxiety    Aortic aneurysm (HCC)    Diabetes mellitus without complication ([place]) 123456   Hayden Kihara   GERD (gastroesophageal reflux disease)    Herpes labialis    History of chicken pox    Hyperlipidemia    Kidney stone     Past Surgical History:  Procedure Laterality Date   ABDOMINAL AORTOGRAM W/LOWER EXTREMITY N/A 07/22/2019   Procedure: ABDOMINAL AORTOGRAM W/LOWER EXTREMITY;  Surgeon: Elam Dutch, MD;  Location: [place];  Service: Cardiovascular;  Laterality: N/A;   APPENDECTOMY     FINGER SURGERY Right    2 screws in ring finger   PERIPHERAL VASCULAR INTERVENTION Left 07/22/2019   Procedure: PERIPHERAL VASCULAR INTERVENTION;  Surgeon: Elam Dutch, MD;  Location: [facility];  Service: Cardiovascular;  Laterality: Left;   TONSILLECTOMY      Family History  Problem Relation Age of Onset   Diabetes Mother    Parkinsonism Mother 66  progressive supranuclear palsy, deceased   CAD Father 51       MI at 71, CABG at 62   Diabetes Maternal Grandmother    Stroke Neg Hx    Cancer Neg Hx    Colon cancer Neg Hx    Esophageal cancer Neg Hx    Rectal cancer Neg Hx    Stomach cancer Neg Hx     Social History:  reports that he quit smoking about 3 years ago. His smoking use included  cigarettes. He has a 7.50 pack-year smoking history. He has never been exposed to tobacco smoke. He has never used smokeless tobacco. He reports that he does not currently use alcohol. He reports that he does not currently use drugs.    Review of Systems    Lipid history:  Has been on 80 mg simvastatin for treatment, prescribed by PCP  His lipids are generally well controlled    Lab Results  Component Value Date   CHOL 148 03/24/2022   HDL 48.00 03/24/2022   LDLCALC 84 03/24/2022   LDLDIRECT 79.0 04/14/2022   TRIG 80.0 03/24/2022   CHOLHDL 3 03/24/2022         Lab Results  Component Value Date   ALT 17 12/23/2021    He has been refusing to take the Covid vaccine   History of atopic dermatitis  Blood pressure is consistently normal, currently on 50 mg losartan prescribed by PCP   BP Readings from Last 3 Encounters:  07/02/22 116/70  05/23/22 138/77  04/16/22 122/60     Review of Systems  Most recent eye exam: 09/2021  Most recent foot exam: 7/21  No numbness or tingling in his feet    Physical Examination:  BP 116/70 (BP Location: Left Arm, Patient Position: Sitting, Cuff Size: Normal)   Pulse (!) 53   Ht 5' 9"$  (1.753 m)   Wt 184 lb (83.5 kg)   SpO2 97%   BMI 27.17 kg/m    ASSESSMENT:  Diabetes type 2, non-insulin-dependent  See history of present illness for detailed discussion of current diabetes management, blood sugar patterns and problems identified  His A1c is 7.4, slightly better  He is on glipizide ER 10 mg, Farxiga, basal bolus insulin and Actos   Has some better readings after his lunch with adding NovoLog but still using lower doses Appears to be needing blood sugar coverage for his meals in the evening also most of the time depending on his diet Periodically also has high readings after breakfast Currently may have low normal readings before breakfast and dinner especially when he is active in the afternoon  HYPERTENSION: His blood  pressure is well-controlled  Renal function normal with continuing [place]:    He will stop glipizide  Continue Actos as long as he has no weight gain for insulin sensitivity and this was explained Likely needs 4 to 6 units coverage for his dinner unless eating low carbohydrate meal Reduce Basaglar to 18 units for now Although he may be a candidate for the CeQur Simplcity device he is reluctant to have another device on him He is going to try and make sure he takes his NovoLog before starting to eat lunch consistently   Patient Instructions  Take 18 Basaglar  Take 4-6 units at supper daily  Stop Glipizide  Total visit time including counseling = 30 minutes    Elayne Snare 07/02/2022, 4:20 PM   Note: This office note was prepared with Dragon voice recognition system  technology. Any transcriptional errors that result from this process are unintentional.

## 2022-07-03 DIAGNOSIS — Z85828 Personal history of other malignant neoplasm of skin: Secondary | ICD-10-CM | POA: Diagnosis not present

## 2022-07-03 DIAGNOSIS — L2089 Other atopic dermatitis: Secondary | ICD-10-CM | POA: Diagnosis not present

## 2022-07-04 ENCOUNTER — Encounter: Payer: Self-pay | Admitting: Endocrinology

## 2022-07-10 ENCOUNTER — Encounter: Payer: 59 | Attending: Physical Medicine & Rehabilitation | Admitting: Physical Medicine & Rehabilitation

## 2022-07-10 ENCOUNTER — Encounter: Payer: Self-pay | Admitting: Physical Medicine & Rehabilitation

## 2022-07-10 VITALS — BP 123/77 | HR 50 | Ht 69.0 in | Wt 179.0 lb

## 2022-07-10 DIAGNOSIS — F411 Generalized anxiety disorder: Secondary | ICD-10-CM | POA: Insufficient documentation

## 2022-07-10 DIAGNOSIS — M533 Sacrococcygeal disorders, not elsewhere classified: Secondary | ICD-10-CM | POA: Insufficient documentation

## 2022-07-10 DIAGNOSIS — G8929 Other chronic pain: Secondary | ICD-10-CM | POA: Diagnosis not present

## 2022-07-10 MED ORDER — CYCLOBENZAPRINE HCL 5 MG PO TABS: 5.0000 mg | ORAL_TABLET | Freq: Two times a day (BID) | ORAL | 0 refills | Status: DC | PRN

## 2022-07-10 MED ORDER — DICLOFENAC SODIUM 1 % EX GEL: 2.0000 g | Freq: Four times a day (QID) | CUTANEOUS | 3 refills | Status: AC

## 2022-07-10 NOTE — Progress Notes (Signed)
Subjective:    Patient ID: Andrew Hart, male    DOB: 1971-03-11, 52 y.o.   MRN: [id number]  HPI  HPI Andrew Hart is a 52 y.o. year old male  who  has a past medical history of Alcohol use (11/08/2015), Anxiety, Aortic aneurysm ([place]), Diabetes mellitus without complication ([place]) (123456), GERD (gastroesophageal reflux disease), Herpes labialis, History of chicken pox, Hyperlipidemia, and Kidney stone.   They are presenting to PM&R clinic as a new patient for pain management evaluation. They were referred  for treatment of left SI joint chronic pain.  He reports that he developed pain in his back about a year and a half ago.  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  Pain is worsened by activities and improved with rest.  Heat also helps this pain.  He was seen by his PCP who noted that he had pain at his left SI joint.  He sees a Restaurant manager, fast food.  Occasionally the pain will shoot across his left anterior hip.   Red flag symptoms: Patient denies saddle anesthesia, loss of bowel or bladder continence, new weakness, new numbness/tingling, or pain waking up at nighttime.   Medications tried: Ibuprofen helps a little- hard on his stomach Tylenol- helps a little  Muscle relaxer?- minimal help Takes celexa for anxiety   Other treatments: Home exercise work on Psychologist, occupational, minimal improvement Year and half pain in SI joint      Resting  Home therapy flexibility, helps Shoots across hip  Interval History Andrew Hart is here to follow-up regarding his chronic back pain.  His SI joint injection was declined by insurance.  Patient admits he had some reservation about having an injection in his back, although he was planning to try this had not been approved.  He feels that his pain is little better since he replaced his prior bed with an adjustable bed frame.  He has minimal improvement with Robaxin.  He does not take NSAIDs due to GI upset.    Pain  Inventory Average Pain 5 Pain Right Now 3 My pain is  .  In the last 24 hours, has pain interfered with the following? General activity 5 Relation with others 4 Enjoyment of life 4 What TIME of day is your pain at its worst? evening Sleep (in general) Fair  Pain is worse with: bending and standing Pain improves with: rest and medication Relief from Meds: 7  Family History  Problem Relation Age of Onset   Diabetes Mother    Parkinsonism Mother 40       progressive supranuclear palsy, deceased   CAD Father 29       MI at 1, CABG at 74   Diabetes Maternal Grandmother    Stroke Neg Hx    Cancer Neg Hx    Colon cancer Neg Hx    Esophageal cancer Neg Hx    Rectal cancer Neg Hx    Stomach cancer Neg Hx    Social History   Socioeconomic History   Marital status: Married    Spouse name: Not on file   Number of children: Not on file   Years of education: Not on file   Highest education level: Not on file  Occupational History   Not on file  Tobacco Use   Smoking status: Former    Packs/day: 0.50    Years: 15.00    Total pack years: 7.50    Types: Cigarettes  Quit date: 02/03/2019    Years since quitting: 3.4    Passive exposure: Never   Smokeless tobacco: Never  Vaping Use   Vaping Use: Never used  Substance and Sexual Activity   Alcohol use: Not Currently    Alcohol/week: 0.0 standard drinks of alcohol    Comment: Regular beer (12 pk/wkend)   Drug use: Not Currently    Comment: MJ (occasional)   Sexual activity: Not on file  Other Topics Concern   Not on file  Social History Narrative   Lives with wife and 2 children (daughter 2003, son 48)    Occ: owns Education administrator business    Activity: active at work, at home on yard   Diet: some water, fruits/vegetables daily    Social Determinants of Health   Financial Resource Strain: Not on file  Food Insecurity: Not on file  Transportation Needs: Not on file  Physical Activity: Not on file  Stress: Not on file   Social Connections: Not on file   Past Surgical History:  Procedure Laterality Date   ABDOMINAL AORTOGRAM W/LOWER EXTREMITY N/A 07/22/2019   Procedure: ABDOMINAL AORTOGRAM W/LOWER EXTREMITY;  Surgeon: Elam Dutch, MD;  Location: [facility];  Service: Cardiovascular;  Laterality: N/A;   APPENDECTOMY     FINGER SURGERY Right    2 screws in ring finger   PERIPHERAL VASCULAR INTERVENTION Left 07/22/2019   Procedure: PERIPHERAL VASCULAR INTERVENTION;  Surgeon: Elam Dutch, MD;  Location: [facility];  Service: Cardiovascular;  Laterality: Left;   TONSILLECTOMY     Past Surgical History:  Procedure Laterality Date   ABDOMINAL AORTOGRAM W/LOWER EXTREMITY N/A 07/22/2019   Procedure: ABDOMINAL AORTOGRAM W/LOWER EXTREMITY;  Surgeon: Elam Dutch, MD;  Location: [facility];  Service: Cardiovascular;  Laterality: N/A;   APPENDECTOMY     FINGER SURGERY Right    2 screws in ring finger   PERIPHERAL VASCULAR INTERVENTION Left 07/22/2019   Procedure: PERIPHERAL VASCULAR INTERVENTION;  Surgeon: Elam Dutch, MD;  Location: [facility];  Service: Cardiovascular;  Laterality: Left;   TONSILLECTOMY     Past Medical History:  Diagnosis Date   Alcohol use 11/08/2015   Anxiety    Aortic aneurysm ([facility])    Diabetes mellitus without complication ([place]) 123456   Kumar   GERD (gastroesophageal reflux disease)    Herpes labialis    History of chicken pox    Hyperlipidemia    Kidney stone    Ht '5\' 9"'$  (1.753 m)   Wt 179 lb (81.2 kg)   BMI 26.43 kg/m   Opioid Risk Score:   Fall Risk Score:  `1  Depression screen [facility] 2/9     07/10/2022   11:15 AM 05/30/2022    3:47 PM 05/23/2022    1:01 PM 03/24/2022   10:52 AM 03/24/2022   10:21 AM 03/20/2021    9:38 AM 03/19/2020    9:35 AM  Depression screen PH[facility] 2/9  Decreased Interest 0 0 0 1 0 0 0  Down, Depressed, Hopeless 0 0 0 1 0 0 1  PHQ - 2 Score 0 0 0 2 0 0 1  Altered sleeping   1 0  1 0  Tired,  decreased energy   '1 1  1 1  '$ Change in appetite   1 0  1 0  Feeling bad or failure about yourself    1 1  0 0  Trouble concentrating   0 0  1  0  Moving slowly or fidgety/restless   0 0  0 0  Suicidal thoughts   0 0  0 0  PHQ-9 Score   '4 4  4 2  '$ Difficult doing work/chores   Somewhat difficult         Review of Systems  Musculoskeletal:  Positive for back pain.  All other systems reviewed and are negative.     Objective:   Physical Exam   Gen: no distress, normal appearing HEENT: oral mucosa pink and moist, NCAT Cardio: Reg rate Chest: normal effort, normal rate of breathing Abd: soft, non-distended Ext: no edema Psych: pleasant, normal affect Skin: intact Neuro: Alert and oriented, follows commands, cranial nerves II through XII intact, normal speech and language Sensation intact light touch in all 4 extremities Strength 5 out of 5 in all 4 extremities No ataxia or dysmetria Musculoskeletal:  Tenderness over left SI joint Minimal tenderness paraspinal muscles of the lumbar spine, mostly on the left SLR negative Facet loading negative Spurling's negative Forton finger- L SI joint FABER positive on the left  SI/iliac compression negative Gaenslen's positive on Left, neg on right Distraction test postive on left Yeomans positive on the left Hip thrust test positive on left Slightly decreased L-spine motion in all directions      Assessment & Plan:   Chronic lower back pain -Exam today continues to indicate SI joint dysfunction, SI joint injection not approved by insurance -I think he may also be having some tenderness of his piriformis and this could be associated with SI joint dysfunction -Consider reordering injection SI joint at a later time if pain does not improve -Consult placed for physical therapy -DC robaxin and start flexeril '5mg'$  prn -Voltaren gel ordered for L SI joint -Discussed trying SI joint belt -Consider Tens unit    Anxiety -He is on  celexa, could consider duloxetine for pain however would have to dc celexa first

## 2022-07-22 ENCOUNTER — Other Ambulatory Visit: Payer: Self-pay

## 2022-07-22 DIAGNOSIS — E1165 Type 2 diabetes mellitus with hyperglycemia: Secondary | ICD-10-CM

## 2022-07-22 MED ORDER — DAPAGLIFLOZIN PROPANEDIOL 10 MG PO TABS: 10.0000 mg | ORAL_TABLET | Freq: Every day | ORAL | 2 refills | Status: DC

## 2022-07-28 ENCOUNTER — Other Ambulatory Visit: Payer: Self-pay

## 2022-07-28 DIAGNOSIS — E1165 Type 2 diabetes mellitus with hyperglycemia: Secondary | ICD-10-CM

## 2022-07-28 MED ORDER — DAPAGLIFLOZIN PROPANEDIOL 10 MG PO TABS: 10.0000 mg | ORAL_TABLET | Freq: Every day | ORAL | 2 refills | Status: DC

## 2022-07-29 ENCOUNTER — Telehealth: Payer: Self-pay

## 2022-07-29 ENCOUNTER — Other Ambulatory Visit (HOSPITAL_COMMUNITY): Payer: Self-pay

## 2022-07-29 NOTE — Therapy (Signed)
OUTPATIENT PHYSICAL THERAPY EVALUATION   Patient Name: Andrew Hart MRN: [id number] DOB:[birth year], 52 y.o., male Today's Date: 07/31/2022   END OF SESSION:  PT End of Session - 07/31/22 1057     Visit Number 1    Number of Visits 17    Date for PT Re-Evaluation 09/25/22    Authorization Type [employer]    PT Start Time 1100    PT Stop Time 1145    PT Time Calculation (min) 45 min    Activity Tolerance Patient tolerated treatment well    Behavior During Therapy [facility] for tasks assessed/performed             Past Medical History:  Diagnosis Date   Alcohol use 11/08/2015   Anxiety    Aortic aneurysm ([place])    Diabetes mellitus without complication ([place]) 123456   Kumar   GERD (gastroesophageal reflux disease)    Herpes labialis    History of chicken pox    Hyperlipidemia    Kidney stone    Past Surgical History:  Procedure Laterality Date   ABDOMINAL AORTOGRAM W/LOWER EXTREMITY N/A 07/22/2019   Procedure: ABDOMINAL AORTOGRAM W/LOWER EXTREMITY;  Surgeon: Elam Dutch, MD;  Location: [facility];  Service: Cardiovascular;  Laterality: N/A;   APPENDECTOMY     FINGER SURGERY Right    2 screws in ring finger   PERIPHERAL VASCULAR INTERVENTION Left 07/22/2019   Procedure: PERIPHERAL VASCULAR INTERVENTION;  Surgeon: Elam Dutch, MD;  Location: [facility];  Service: Cardiovascular;  Laterality: Left;   TONSILLECTOMY     Patient Active Problem List   Diagnosis Date Noted   Pneumothorax, right 09/20/2021   Primary hypertension 09/18/2021   Rib pain on right side 09/18/2021   Chronic left-sided low back pain without sciatica 03/20/2021   S/P insertion of iliac artery stent 09/23/2020   Tinnitus 03/19/2020   Diabetic peripheral vascular disease ([place]) 09/21/2019   Aorto-iliac atherosclerosis ([place]) 03/29/2019   Abdominal aortic ectasia ([place]) 08/11/2017   Macrocytosis 07/31/2017   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   Encounter for general adult medical  examination with abnormal findings 06/26/2016   Ex-smoker 06/26/2016   Family history of premature CAD 06/26/2016   Family history of abdominal aortic aneurysm (AAA) 06/26/2016   History of alcohol use 11/08/2015   Erectile dysfunction 09/02/2015   GERD (gastroesophageal reflux disease)    Insomnia 08/06/2015   Adjustment disorder with anxiety 08/06/2015   Type 2 diabetes mellitus with other specified complication ([place]) 0000000   Hyperlipidemia associated with type 2 diabetes mellitus ([place]) 05/18/2015    PCP: Ria Bush, MD  REFERRING PROVIDER: Jennye Boroughs, MD  REFERRING DIAG: Chronic left SI joint pain  Rationale for Evaluation and Treatment: Rehabilitation  THERAPY DIAG:  Other low back pain  Muscle weakness (generalized)  ONSET DATE: Ongoing for about a year   SUBJECTIVE:  SUBJECTIVE STATEMENT: Patient reports chronic lower back pain and SI joint have been bothering him. 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 Typically is ok in the morning and will get worse toward the end of the day. Ongoing for a about a year with gradual onset. States that bending hurts, standing on feet all day will aggravate pain. Pain is mainly located to lower middle back and left buttock area. He has had some pain into the back/side of the left thigh but not very often. Denies any numbness or tingling. He has been seeing a chiropractor for his neck and back, was going twice a week for about 2 months and is now as needed.  PERTINENT HISTORY:  Abdominal aortic aneurism, DM   PAIN:  Are you having pain? Yes:  NPRS scale: 2/10 (8/10 at end of day) Pain location: Lower back and left buttock Pain description: "Jabbing" Aggravating factors: Bending, standing extended periods,  lifting Relieving factors: Tylenol, rest, heat works for a little while  PRECAUTIONS: None  WEIGHT BEARING RESTRICTIONS: No  FALLS:  Has patient fallen in last 6 months? No  OCCUPATION: Cleans carpet  PLOF: Independent  PATIENT GOALS: Pain relief   OBJECTIVE:  DIAGNOSTIC FINDINGS:  Lumbar x-ray 03/24/2022 IMPRESSION: 1. 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 2. Severe atherosclerotic calcifications of the aorta. There is a 3.8 cm aortic aneurysm which may have increased in size compared to 2020. Follow-up ultrasound or CT recommended.  PATIENT SURVEYS:  FOTO: 52% functional status  SCREENING FOR RED FLAGS: Negative  COGNITION: Overall cognitive status: Within functional limits for tasks assessed     SENSATION: WFL  MUSCLE LENGTH: Hamstring flexibility deficit  POSTURE:   Decreased lumbar lordosis, rounded shoulders  PALPATION: Tender to palpation left lower lumbar paraspinals, left upper gluteal region; increased low back pain with   LUMBAR ROM:   AROM eval  Flexion 75% - hamstring tightness  Extension WFL  Right lateral flexion WFL  Left lateral flexion WFL  Right rotation WFL  Left rotation WFL   (Blank rows = not tested)  Patient reports midline low back pain with lumbar flexion  LOWER EXTREMITY ROM:      LE ROM grossly WFL  LOWER EXTREMITY MMT:    MMT Right eval Left eval  Hip flexion 4 4-  Hip extension 4 4-  Hip abduction 4 4-  Hip adduction    Hip internal rotation    Hip external rotation    Knee flexion 5 5  Knee extension 5 5  Ankle dorsiflexion    Ankle plantarflexion    Ankle inversion    Ankle eversion     (Blank rows = not tested)  LUMBAR SPECIAL TESTS:  Radicular testing negative  FUNCTIONAL TESTS:  DLLT: loss of lumbar control at approximately 45 deg, no report of pain  GAIT: Assistive device utilized: None Level of assistance: Complete Independence Comments: WFL   TODAY'S  TREATMENT:   [facility] Adult PT Treatment:                                                DATE: 07/31/2022 Therapeutic Exercise: Bridge 10 x 5 sec Side clamshell with green x 10 Seated hamstring stretch 2 x 20 sec each  PATIENT EDUCATION:  Education details: Exam findings, POC, HEP Person educated: Patient  Education method: Explanation, Demonstration, Tactile cues, Verbal cues, and Handouts Education comprehension: verbalized understanding, returned demonstration, verbal cues required, tactile cues required, and needs further education  HOME EXERCISE PROGRAM: Access Code: [email or web address]    ASSESSMENT: CLINICAL IMPRESSION: Patient is a 52 y.o. male who was seen today for physical therapy evaluation and treatment for chronic lower back and left buttock pain. He demonstrates good range of motion but does report low back and hamstring tightness with forward bend, also demonstrates core and left hip strength deficits that are likely contributing to his pain.    OBJECTIVE IMPAIRMENTS: decreased activity tolerance, decreased ROM, decreased strength, impaired flexibility, postural dysfunction, and pain.   ACTIVITY LIMITATIONS: carrying, lifting, bending, sitting, standing, and locomotion level  PARTICIPATION LIMITATIONS: community activity and occupation  PERSONAL FACTORS: Fitness, Past/current experiences, Profession, and Time since onset of injury/illness/exacerbation are also affecting patient's functional outcome.   REHAB POTENTIAL: Good  CLINICAL DECISION MAKING: Stable/uncomplicated  EVALUATION COMPLEXITY: Low   GOALS: Goals reviewed with patient? Yes  SHORT TERM GOALS: Target date: 08/28/2022  Patient will be I with initial HEP in order to progress with therapy. Baseline: HEP provided at eval Goal status: INITIAL  2.  PT will review FOTO with patient by 3rd visit in order to understand expected progress and outcome with therapy. Baseline: FOTO assessed at eval Goal status:  INITIAL  3.  Patient will report low back pain following work </= 5/10 in order to reduce functional limitations Baseline: 8/10 pain Goal status: INITIAL  LONG TERM GOALS: Target date: 09/25/2022  Patient will be I with final HEP to maintain progress from PT. Baseline: HEP provided at eval Goal status: INITIAL  2.  Patient will report >/= 64% status on FOTO to indicate improved functional ability. Baseline: 52% functional status Goal status: INITIAL  3.  Patient will demonstrate left hip strengthening >/= 4/5 MMT and DLLT </= 30 deg in order to indicate improved lumbopelvic control and reduce pain with work related tasks Baseline: hip strength 4-/5 MMT and DLLT to 45 deg Goal status: INITIAL  4.  Patient will demonstrate lumbar flexion WFL and non-painful to improve bending and lifting tasks at work Baseline: lumbar flexion grossly 75% and painful Goal status: INITIAL  5.  Patient will report low back pain following work </= 3/10 in order to reduce functional limitations Baseline: 8/10 pain Goal status: INITIAL   PLAN: PT FREQUENCY: 1-2x/week  PT DURATION: 8 weeks  PLANNED INTERVENTIONS: Therapeutic exercises, Therapeutic activity, Neuromuscular re-education, Balance training, Gait training, Patient/Family education, Self Care, Joint mobilization, Joint manipulation, Aquatic Therapy, Dry Needling, Spinal manipulation, Spinal mobilization, Manual therapy, and Re-evaluation.  PLAN FOR NEXT SESSION: Review HEP and progress PRN, manual/dry needling for left lumbar and gluteal/piriformis region, hamstring stretching, progress core and hip strengthening   Hilda Blades, PT, DPT, LAT, ATC 07/31/22  12:27 PM Phone: [phone] Fax: [phone]

## 2022-07-29 NOTE — Telephone Encounter (Signed)
Wilder Glade needs PA

## 2022-07-31 ENCOUNTER — Other Ambulatory Visit: Payer: Self-pay

## 2022-07-31 ENCOUNTER — Encounter: Payer: Self-pay | Admitting: Physical Therapy

## 2022-07-31 ENCOUNTER — Ambulatory Visit: Payer: 59 | Attending: Family Medicine | Admitting: Physical Therapy

## 2022-07-31 DIAGNOSIS — M6281 Muscle weakness (generalized): Secondary | ICD-10-CM | POA: Insufficient documentation

## 2022-07-31 DIAGNOSIS — M5459 Other low back pain: Secondary | ICD-10-CM | POA: Diagnosis not present

## 2022-07-31 DIAGNOSIS — M533 Sacrococcygeal disorders, not elsewhere classified: Secondary | ICD-10-CM | POA: Insufficient documentation

## 2022-07-31 DIAGNOSIS — M9905 Segmental and somatic dysfunction of pelvic region: Secondary | ICD-10-CM | POA: Diagnosis not present

## 2022-07-31 DIAGNOSIS — G8929 Other chronic pain: Secondary | ICD-10-CM | POA: Insufficient documentation

## 2022-07-31 DIAGNOSIS — M9901 Segmental and somatic dysfunction of cervical region: Secondary | ICD-10-CM | POA: Diagnosis not present

## 2022-07-31 DIAGNOSIS — M9904 Segmental and somatic dysfunction of sacral region: Secondary | ICD-10-CM | POA: Diagnosis not present

## 2022-07-31 DIAGNOSIS — M9903 Segmental and somatic dysfunction of lumbar region: Secondary | ICD-10-CM | POA: Diagnosis not present

## 2022-07-31 NOTE — Patient Instructions (Signed)
Access Code: [email or web address] URL: [email or web address] Date: 07/31/2022 Prepared by: Hilda Blades  Exercises - Supine Bridge  - 1 x daily - 2 sets - 10 reps - 5 seconds hold - Clam with Resistance  - 1 x daily - 2 sets - 10 reps - Seated Hamstring Stretch  - 1 x daily - 3 reps - 20 seconds hold

## 2022-08-04 ENCOUNTER — Ambulatory Visit: Payer: 59 | Attending: Family Medicine | Admitting: Physical Therapy

## 2022-08-04 ENCOUNTER — Other Ambulatory Visit: Payer: Self-pay

## 2022-08-04 ENCOUNTER — Encounter: Payer: Self-pay | Admitting: Physical Therapy

## 2022-08-04 DIAGNOSIS — M5459 Other low back pain: Secondary | ICD-10-CM

## 2022-08-04 DIAGNOSIS — M6281 Muscle weakness (generalized): Secondary | ICD-10-CM

## 2022-08-04 NOTE — Therapy (Signed)
OUTPATIENT PHYSICAL THERAPY TREATMENT NOTE   Patient Name: Andrew Hart MRN: [id number] DOB:08-18-1970, 52 y.o., male Today's Date: 08/04/2022  PCP: Ria Bush, MD REFERRING PROVIDER: Jennye Boroughs, MD   END OF SESSION:   PT End of Session - 08/04/22 1454     Visit Number 2    Number of Visits 17    Date for PT Re-Evaluation 09/25/22    Authorization Type [employer]    PT Start Time 1451    PT Stop Time 1530    PT Time Calculation (min) 39 min    Activity Tolerance Patient tolerated treatment well    Behavior During Therapy [facility] for tasks assessed/performed             Past Medical History:  Diagnosis Date   Alcohol use 11/08/2015   Anxiety    Aortic aneurysm    Diabetes mellitus without complication [id number]   Kumar   GERD (gastroesophageal reflux disease)    Herpes labialis    History of chicken pox    Hyperlipidemia    Kidney stone    Past Surgical History:  Procedure Laterality Date   ABDOMINAL AORTOGRAM W/LOWER EXTREMITY N/A 07/22/2019   Procedure: ABDOMINAL AORTOGRAM W/LOWER EXTREMITY;  Surgeon: Elam Dutch, MD;  Location: [place] [facility];  Service: Cardiovascular;  Laterality: N/A;   APPENDECTOMY     FINGER SURGERY Right    2 screws in ring finger   PERIPHERAL VASCULAR INTERVENTION Left 07/22/2019   Procedure: PERIPHERAL VASCULAR INTERVENTION;  Surgeon: Elam Dutch, MD;  Location: [place] [facility];  Service: Cardiovascular;  Laterality: Left;   TONSILLECTOMY     Patient Active Problem List   Diagnosis Date Noted   Pneumothorax, right 09/20/2021   Primary hypertension 09/18/2021   Rib pain on right side 09/18/2021   Chronic left-sided low back pain without sciatica 03/20/2021   S/P insertion of iliac artery stent 09/23/2020   Tinnitus 03/19/2020   Diabetic peripheral vascular disease 09/21/2019   Aorto-iliac atherosclerosis 03/29/2019   Abdominal aortic ectasia 08/11/2017   Macrocytosis 07/31/2017   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    Encounter for general adult medical examination with abnormal findings 06/26/2016   Ex-smoker 06/26/2016   Family history of premature CAD 06/26/2016   Family history of abdominal aortic aneurysm (AAA) 06/26/2016   History of alcohol use 11/08/2015   Erectile dysfunction 09/02/2015   GERD (gastroesophageal reflux disease)    Insomnia 08/06/2015   Adjustment disorder with anxiety 08/06/2015   Type 2 diabetes mellitus with other specified complication 0000000   Hyperlipidemia associated with type 2 diabetes mellitus 05/18/2015    REFERRING DIAG: Chronic left SI joint pain   THERAPY DIAG:  Other low back pain  Muscle weakness (generalized)  Rationale for Evaluation and Treatment Rehabilitation  PERTINENT HISTORY: Abdominal aortic aneurism, DM   PRECAUTIONS: None   SUBJECTIVE:  SUBJECTIVE STATEMENT:  Patient reports he is doing well with no new issues.   PAIN:  Are you having pain? Yes:  NPRS scale: 2/10 (8/10 at end of day) Pain location: Lower back and left buttock Pain description: "Jabbing" Aggravating factors: Bending, standing extended periods, lifting Relieving factors: Tylenol, rest, heat works for a little while   OBJECTIVE: (objective measures completed at initial evaluation unless otherwise dated) PATIENT SURVEYS:  FOTO: 52% functional status  MUSCLE LENGTH: Hamstring flexibility deficit   POSTURE:             Decreased lumbar lordosis, rounded shoulders   PALPATION: Tender to palpation left lower lumbar paraspinals, left upper gluteal region; increased low back pain with    LUMBAR ROM:    AROM eval  Flexion 75% - hamstring tightness  Extension WFL  Right lateral flexion WFL  Left lateral flexion WFL  Right rotation WFL  Left rotation WFL   (Blank rows = not tested)    Patient reports midline low back pain with lumbar flexion  LOWER EXTREMITY MMT:     MMT Right eval Left eval Rt / Lt   Hip flexion 4 4-   Hip extension 4 4-   Hip abduction 4 4- 4 / 4-  Hip adduction       Hip internal rotation       Hip external rotation       Knee flexion 5 5   Knee extension 5 5   Ankle dorsiflexion       Ankle plantarflexion       Ankle inversion       Ankle eversion        (Blank rows = not tested)  FUNCTIONAL TESTS:  DLLT: loss of lumbar control at approximately 45 deg, no report of pain     TODAY'S TREATMENT:   [facility] Adult PT Treatment:                                                DATE: 08/04/2022 Therapeutic Exercise: NuStep L6 x 5 min with UE/LE while taking subjective Seated hamstring stretch 3 x 20 sec each Figure-4 LTR x 5 each Piriformis stretch 2 x 20 sec each Bridge with green 2 x 10 Side clamshell with green 2 x 15 each SLR with abdominal engagement 2 x 10 each Hooklying active hamstring stretch x 10 each Pallof press with FM 13# 2 x 10 each   [facility] Adult PT Treatment:                                                DATE: 07/31/2022 Therapeutic Exercise: Bridge 10 x 5 sec Side clamshell with green x 10 Seated hamstring stretch 2 x 20 sec each   PATIENT EDUCATION:  Education details: HEP Person educated: Patient Education method: Consulting civil engineer, Demonstration, Corporate treasurer cues, Verbal cues Education comprehension: verbalized understanding, returned demonstration, verbal cues required, tactile cues required, and needs further education   HOME EXERCISE PROGRAM: Access Code: [email or web address]      ASSESSMENT: CLINICAL IMPRESSION: Patient tolerated therapy well with no adverse effects. Therapy focused primarily on progressing hip and core strengthening with good tolerance. He does continue to exhibit hip strength deficits greater on the left. He  was able to progress with his strengthening this visit and did report muscle fatigue and continued  tightness of both hamstrings. Updated HEP with good tolerance. Patient would benefit from continued skilled PT to progress his mobility and strength in order to reduce pain and maximize functional ability.     OBJECTIVE IMPAIRMENTS: decreased activity tolerance, decreased ROM, decreased strength, impaired flexibility, postural dysfunction, and pain.    ACTIVITY LIMITATIONS: carrying, lifting, bending, sitting, standing, and locomotion level   PARTICIPATION LIMITATIONS: community activity and occupation   PERSONAL FACTORS: Fitness, Past/current experiences, Profession, and Time since onset of injury/illness/exacerbation are also affecting patient's functional outcome.      GOALS: Goals reviewed with patient? Yes   SHORT TERM GOALS: Target date: 08/28/2022   Patient will be I with initial HEP in order to progress with therapy. Baseline: HEP provided at eval Goal status: INITIAL   2.  PT will review FOTO with patient by 3rd visit in order to understand expected progress and outcome with therapy. Baseline: FOTO assessed at eval Goal status: INITIAL   3.  Patient will report low back pain following work </= 5/10 in order to reduce functional limitations Baseline: 8/10 pain Goal status: INITIAL   LONG TERM GOALS: Target date: 09/25/2022   Patient will be I with final HEP to maintain progress from PT. Baseline: HEP provided at eval Goal status: INITIAL   2.  Patient will report >/= 64% status on FOTO to indicate improved functional ability. Baseline: 52% functional status Goal status: INITIAL   3.  Patient will demonstrate left hip strengthening >/= 4/5 MMT and DLLT </= 30 deg in order to indicate improved lumbopelvic control and reduce pain with work related tasks Baseline: hip strength 4-/5 MMT and DLLT to 45 deg Goal status: INITIAL   4.  Patient will demonstrate lumbar flexion WFL and non-painful to improve bending and lifting tasks at work Baseline: lumbar flexion grossly 75%  and painful Goal status: INITIAL   5.  Patient will report low back pain following work </= 3/10 in order to reduce functional limitations Baseline: 8/10 pain Goal status: INITIAL     PLAN: PT FREQUENCY: 1-2x/week   PT DURATION: 8 weeks   PLANNED INTERVENTIONS: Therapeutic exercises, Therapeutic activity, Neuromuscular re-education, Balance training, Gait training, Patient/Family education, Self Care, Joint mobilization, Joint manipulation, Aquatic Therapy, Dry Needling, Spinal manipulation, Spinal mobilization, Manual therapy, and Re-evaluation.   PLAN FOR NEXT SESSION: Review HEP and progress PRN, manual/dry needling for left lumbar and gluteal/piriformis region, hamstring stretching, progress core and hip strengthening   Hilda Blades, PT, DPT, LAT, ATC 08/04/22  4:04 PM Phone: [phone] Fax: [phone]

## 2022-08-04 NOTE — Patient Instructions (Signed)
Access Code: [email or web address] URL: [email or web address] Date: 08/04/2022 Prepared by: Hilda Blades  Exercises - Supine Bridge  - 1 x daily - 2 sets - 10 reps - 5 seconds hold - Clam with Resistance  - 1 x daily - 2 sets - 10 reps - Seated Hamstring Stretch  - 1 x daily - 3 reps - 20 seconds hold - Straight Leg Raise  - 1 x daily - 2 sets - 10 reps

## 2022-08-11 ENCOUNTER — Other Ambulatory Visit: Payer: Self-pay | Admitting: Endocrinology

## 2022-08-11 ENCOUNTER — Telehealth: Payer: Self-pay

## 2022-08-11 DIAGNOSIS — E1165 Type 2 diabetes mellitus with hyperglycemia: Secondary | ICD-10-CM

## 2022-08-11 NOTE — Telephone Encounter (Signed)
Patient Advocate Encounter   Received notification from pt msgs that prior authorization is required for Farxiga 10mg  tablets   Submitted: 08/11/22 Key [id number]   Status is pending

## 2022-08-11 NOTE — Telephone Encounter (Signed)
Patient Advocate Encounter   Received notification from [employer] that prior authorization is required for Jardiance 25MG  tablets  Submitted: 08/11/22 Key  [id number]  Status is pending

## 2022-08-12 NOTE — Telephone Encounter (Signed)
Pharmacy Patient Advocate Encounter  Prior Authorization for Jardiance 25MG  tablets  has been approved by [employer] (ins).    PA # [id number] SL Effective dates: 08/11/22 through 08/11/23

## 2022-08-13 NOTE — Telephone Encounter (Signed)
PA for Farxiga 10mg  has been DENIED.   Denial letter has been attached in patients documents.

## 2022-08-14 ENCOUNTER — Ambulatory Visit: Payer: 59 | Admitting: Physical Therapy

## 2022-08-14 ENCOUNTER — Other Ambulatory Visit: Payer: Self-pay

## 2022-08-14 ENCOUNTER — Encounter: Payer: Self-pay | Admitting: Physical Therapy

## 2022-08-14 DIAGNOSIS — M5459 Other low back pain: Secondary | ICD-10-CM | POA: Diagnosis not present

## 2022-08-14 DIAGNOSIS — M6281 Muscle weakness (generalized): Secondary | ICD-10-CM | POA: Diagnosis not present

## 2022-08-14 NOTE — Telephone Encounter (Signed)
Andrew Hart was denied but Andrew Hart was approved. I don't see a script on file for Jardaince.

## 2022-08-14 NOTE — Telephone Encounter (Signed)
Pt called back for update.

## 2022-08-14 NOTE — Therapy (Signed)
OUTPATIENT PHYSICAL THERAPY TREATMENT NOTE   Patient Name: Andrew Hart MRN: [id number] DOB:07-15-1970, 52 y.o., male Today's Date: 08/14/2022  PCP: Eustaquio Boyden, MD REFERRING PROVIDER: Fanny Dance, MD   END OF SESSION:   PT End of Session - 08/14/22 1624     Visit Number 3    Number of Visits 17    Date for PT Re-Evaluation 09/25/22    Authorization Type [employer]et[employer]    PT Start Time 1620    PT Stop Time 1700    PT Time Calculation (min) 40 min    Activity Tolerance Patient tolerated treatment well    Behavior During Therapy [facility] for tasks assessed/performed              Past Medical History:  Diagnosis Date   Alcohol use 11/08/2015   Anxiety    Aortic aneurysm    Diabetes mellitus without complication 2006   Kumar   GERD (gastroesophageal reflux disease)    Herpes labialis    History of chicken pox    Hyperlipidemia    Kidney stone    Past Surgical History:  Procedure Laterality Date   ABDOMINAL AORTOGRAM W/LOWER EXTREMITY N/A 07/22/2019   Procedure: ABDOMINAL AORTOGRAM W/LOWER EXTREMITY;  Surgeon: Sherren Kerns, MD;  Location: [facility];  Service: Cardiovascular;  Laterality: N/A;   APPENDECTOMY     FINGER SURGERY Right    2 screws in ring finger   PERIPHERAL VASCULAR INTERVENTION Left 07/22/2019   Procedure: PERIPHERAL VASCULAR INTERVENTION;  Surgeon: Sherren Kerns, MD;  Location: [facility];  Service: Cardiovascular;  Laterality: Left;   TONSILLECTOMY     Patient Active Problem List   Diagnosis Date Noted   Pneumothorax, right 09/20/2021   Primary hypertension 09/18/2021   Rib pain on right side 09/18/2021   Chronic left-sided low back pain without sciatica 03/20/2021   S/P insertion of iliac artery stent 09/23/2020   Tinnitus 03/19/2020   Diabetic peripheral vascular disease 09/21/2019   Aorto-iliac atherosclerosis 03/29/2019   Abdominal aortic ectasia 08/11/2017   Macrocytosis 07/31/2017   Prurigo nodularis  12/18/2016   Encounter for general adult medical examination with abnormal findings 06/26/2016   Ex-smoker 06/26/2016   Family history of premature CAD 06/26/2016   Family history of abdominal aortic aneurysm (AAA) 06/26/2016   History of alcohol use 11/08/2015   Erectile dysfunction 09/02/2015   GERD (gastroesophageal reflux disease)    Insomnia 08/06/2015   Adjustment disorder with anxiety 08/06/2015   Type 2 diabetes mellitus with other specified complication 05/18/2015   Hyperlipidemia associated with type 2 diabetes mellitus 05/18/2015    REFERRING DIAG: Chronic left SI joint pain   THERAPY DIAG:  Other low back pain  Muscle weakness (generalized)  Rationale for Evaluation and Treatment Rehabilitation  PERTINENT HISTORY: Abdominal aortic aneurism, DM   PRECAUTIONS: None   SUBJECTIVE:  SUBJECTIVE STATEMENT:  Patient reports he is doing well, he was working today and the left sciatica started to flare up a little bit.  PAIN:  Are you having pain? Yes:  NPRS scale: 2/10 (8/10 at end of day) Pain location: Lower back and left buttock Pain description: "Jabbing" Aggravating factors: Bending, standing extended periods, lifting Relieving factors: Tylenol, rest, heat works for a little while   OBJECTIVE: (objective measures completed at initial evaluation unless otherwise dated) PATIENT SURVEYS:  FOTO: 52% functional status  MUSCLE LENGTH: Hamstring flexibility deficit   POSTURE:             Decreased lumbar lordosis, rounded shoulders   PALPATION: Tender to palpation left lower lumbar paraspinals, left upper gluteal region; increased low back pain with    LUMBAR ROM:    AROM eval 08/14/2022  Flexion 75% - hamstring tightness 75% - hamstring tightness  Extension WFL   Right lateral  flexion WFL   Left lateral flexion WFL   Right rotation WFL   Left rotation WFL    (Blank rows = not tested)   Patient reports midline low back pain with lumbar flexion  LOWER EXTREMITY MMT:     MMT Right eval Left eval Rt / Lt 08/04/22  Hip flexion 4 4-   Hip extension 4 4-   Hip abduction 4 4- 4 / 4-  Hip adduction       Hip internal rotation       Hip external rotation       Knee flexion 5 5   Knee extension 5 5   Ankle dorsiflexion       Ankle plantarflexion       Ankle inversion       Ankle eversion        (Blank rows = not tested)  FUNCTIONAL TESTS:  DLLT: loss of lumbar control at approximately 45 deg, no report of pain     TODAY'S TREATMENT:   [facility] Adult PT Treatment:                                                DATE: 08/14/2022 Therapeutic Exercise: NuStep L7 x 4 min with UE/LE while taking subjective Figure-4 LTR x 5 each Piriformis stretch 2 x 20 sec each Supine hamstring stretch with strap 3 x 20 sec each Figure-4 bridge 2 x 10 each 90-90 alternating foot tap 2 x 10 Modified side plank on knees 3 x 20 sec each Side clamshell with blue 2 x 15 each Pallof press with FM 17# 2 x 10 each Split stance RDL FM 17# 2 x 10   [facility] Adult PT Treatment:                                                DATE: 08/04/2022 Therapeutic Exercise: NuStep L6 x 5 min with UE/LE while taking subjective Seated hamstring stretch 3 x 20 sec each Figure-4 LTR x 5 each Piriformis stretch 2 x 20 sec each Bridge with green 2 x 10 Side clamshell with green 2 x 15 each SLR with abdominal engagement 2 x 10 each Hooklying active hamstring stretch x 10 each Pallof press with FM 13# 2 x 10 each  [facility] Adult  PT Treatment:                                                DATE: 07/31/2022 Therapeutic Exercise: Bridge 10 x 5 sec Side clamshell with green x 10 Seated hamstring stretch 2 x 20 sec each   PATIENT EDUCATION:  Education details: HEP Person educated: Patient Education method:  Programmer, multimediaxplanation, Demonstration, ActorTactile cues, Verbal cues Education comprehension: verbalized understanding, returned demonstration, verbal cues required, tactile cues required, and needs further education   HOME EXERCISE PROGRAM: Access Code: [email or web address]      ASSESSMENT: CLINICAL IMPRESSION: Patient tolerated therapy well with no adverse effects. Therapy continues to focus on progressing his flexibility and core strength and control with good tolerance. He does continue to exhibit some hamstring tightness and is progressing well with resistance for core and hip strengthening. He does require cuing for core activation to maintain neutral lumbar spine and he does report some left hip burning with exercises. Patient would benefit from continued skilled PT to progress his mobility and strength in order to reduce pain and maximize functional ability.     OBJECTIVE IMPAIRMENTS: decreased activity tolerance, decreased ROM, decreased strength, impaired flexibility, postural dysfunction, and pain.    ACTIVITY LIMITATIONS: carrying, lifting, bending, sitting, standing, and locomotion level   PARTICIPATION LIMITATIONS: community activity and occupation   PERSONAL FACTORS: Fitness, Past/current experiences, Profession, and Time since onset of injury/illness/exacerbation are also affecting patient's functional outcome.      GOALS: Goals reviewed with patient? Yes   SHORT TERM GOALS: Target date: 08/28/2022   Patient will be I with initial HEP in order to progress with therapy. Baseline: HEP provided at eval Goal status: INITIAL   2.  PT will review FOTO with patient by 3rd visit in order to understand expected progress and outcome with therapy. Baseline: FOTO assessed at eval Goal status: INITIAL   3.  Patient will report low back pain following work </= 5/10 in order to reduce functional limitations Baseline: 8/10 pain Goal status: INITIAL   LONG TERM GOALS: Target date: 09/25/2022   Patient will  be I with final HEP to maintain progress from PT. Baseline: HEP provided at eval Goal status: INITIAL   2.  Patient will report >/= 64% status on FOTO to indicate improved functional ability. Baseline: 52% functional status Goal status: INITIAL   3.  Patient will demonstrate left hip strengthening >/= 4/5 MMT and DLLT </= 30 deg in order to indicate improved lumbopelvic control and reduce pain with work related tasks Baseline: hip strength 4-/5 MMT and DLLT to 45 deg Goal status: INITIAL   4.  Patient will demonstrate lumbar flexion WFL and non-painful to improve bending and lifting tasks at work Baseline: lumbar flexion grossly 75% and painful Goal status: INITIAL   5.  Patient will report low back pain following work </= 3/10 in order to reduce functional limitations Baseline: 8/10 pain Goal status: INITIAL     PLAN: PT FREQUENCY: 1-2x/week   PT DURATION: 8 weeks   PLANNED INTERVENTIONS: Therapeutic exercises, Therapeutic activity, Neuromuscular re-education, Balance training, Gait training, Patient/Family education, Self Care, Joint mobilization, Joint manipulation, Aquatic Therapy, Dry Needling, Spinal manipulation, Spinal mobilization, Manual therapy, and Re-evaluation.   PLAN FOR NEXT SESSION: Review HEP and progress PRN, manual/dry needling for left lumbar and gluteal/piriformis region, hamstring stretching, progress core and hip strengthening  Rosana Hoes, PT, DPT, LAT, ATC 08/14/22  5:04 PM Phone: [phone] Fax: [phone]

## 2022-08-15 MED ORDER — EMPAGLIFLOZIN 25 MG PO TABS: 25.0000 mg | ORAL_TABLET | Freq: Every day | ORAL | 3 refills | Status: DC

## 2022-08-15 NOTE — Telephone Encounter (Signed)
Script has been sent.

## 2022-08-21 ENCOUNTER — Ambulatory Visit: Payer: 59 | Admitting: Physical Therapy

## 2022-08-21 ENCOUNTER — Encounter: Payer: Self-pay | Admitting: Physical Therapy

## 2022-08-21 DIAGNOSIS — M5459 Other low back pain: Secondary | ICD-10-CM

## 2022-08-21 DIAGNOSIS — M6281 Muscle weakness (generalized): Secondary | ICD-10-CM

## 2022-08-21 NOTE — Therapy (Addendum)
OUTPATIENT PHYSICAL THERAPY TREATMENT NOTE   Patient Name: Andrew Hart MRN: [id number] DOB:1971-03-16, 52 y.o., male Today's Date: 08/21/2022  PCP: Eustaquio Boyden, MD REFERRING PROVIDER: Fanny Dance, MD   END OF SESSION:   PT End of Session - 08/21/22 1402     Visit Number 4    Number of Visits 17    Date for PT Re-Evaluation 09/25/22    Authorization Type [employer]    PT Start Time 1400    PT Stop Time 1442    PT Time Calculation (min) 42 min              Past Medical History:  Diagnosis Date   Alcohol use 11/08/2015   Anxiety    Aortic aneurysm    Diabetes mellitus without complication 2006   Kumar   GERD (gastroesophageal reflux disease)    Herpes labialis    History of chicken pox    Hyperlipidemia    Kidney stone    Past Surgical History:  Procedure Laterality Date   ABDOMINAL AORTOGRAM W/LOWER EXTREMITY N/A 07/22/2019   Procedure: ABDOMINAL AORTOGRAM W/LOWER EXTREMITY;  Surgeon: Sherren Kerns, MD;  Location: [facility];  Service: Cardiovascular;  Laterality: N/A;   APPENDECTOMY     FINGER SURGERY Right    2 screws in ring finger   PERIPHERAL VASCULAR INTERVENTION Left 07/22/2019   Procedure: PERIPHERAL VASCULAR INTERVENTION;  Surgeon: Sherren Kerns, MD;  Location: [facility];  Service: Cardiovascular;  Laterality: Left;   TONSILLECTOMY     Patient Active Problem List   Diagnosis Date Noted   Pneumothorax, right 09/20/2021   Primary hypertension 09/18/2021   Rib pain on right side 09/18/2021   Chronic left-sided low back pain without sciatica 03/20/2021   S/P insertion of iliac artery stent 09/23/2020   Tinnitus 03/19/2020   Diabetic peripheral vascular disease 09/21/2019   Aorto-iliac atherosclerosis 03/29/2019   Abdominal aortic ectasia 08/11/2017   Macrocytosis 07/31/2017   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   Encounter for general adult medical examination with abnormal findings 06/26/2016   Ex-smoker 06/26/2016    Family history of premature CAD 06/26/2016   Family history of abdominal aortic aneurysm (AAA) 06/26/2016   History of alcohol use 11/08/2015   Erectile dysfunction 09/02/2015   GERD (gastroesophageal reflux disease)    Insomnia 08/06/2015   Adjustment disorder with anxiety 08/06/2015   Type 2 diabetes mellitus with other specified complication 05/18/2015   Hyperlipidemia associated with type 2 diabetes mellitus 05/18/2015    REFERRING DIAG: Chronic left SI joint pain   THERAPY DIAG:  Other low back pain  Muscle weakness (generalized)  Rationale for Evaluation and Treatment Rehabilitation  PERTINENT HISTORY: Abdominal aortic aneurism, DM   PRECAUTIONS: None   SUBJECTIVE:  SUBJECTIVE STATEMENT:  Patient reports he is doing well, he was working today and the left sciatica started to flare up a little bit.  PAIN:  Are you having pain? Yes:  NPRS scale: 2/10 (8/10 at end of day) Pain location: Lower back and left buttock Pain description: "Jabbing" Aggravating factors: Bending, standing extended periods, lifting Relieving factors: Tylenol, rest, heat works for a little while   OBJECTIVE: (objective measures completed at initial evaluation unless otherwise dated) PATIENT SURVEYS:  FOTO: 52% functional status  MUSCLE LENGTH: Hamstring flexibility deficit   POSTURE:             Decreased lumbar lordosis, rounded shoulders   PALPATION: Tender to palpation left lower lumbar paraspinals, left upper gluteal region; increased low back pain with    LUMBAR ROM:    AROM eval 08/14/2022  Flexion 75% - hamstring tightness 75% - hamstring tightness  Extension WFL   Right lateral flexion WFL   Left lateral flexion WFL   Right rotation WFL   Left rotation WFL    (Blank rows = not tested)   Patient  reports midline low back pain with lumbar flexion  LOWER EXTREMITY MMT:     MMT Right eval Left eval Rt / Lt 08/04/22  Hip flexion 4 4-   Hip extension 4 4-   Hip abduction 4 4- 4 / 4-  Hip adduction       Hip internal rotation       Hip external rotation       Knee flexion 5 5   Knee extension 5 5   Ankle dorsiflexion       Ankle plantarflexion       Ankle inversion       Ankle eversion        (Blank rows = not tested)  FUNCTIONAL TESTS:  DLLT: loss of lumbar control at approximately 45 deg, no report of pain     TODAY'S TREATMENT:   [facility] Adult PT Treatment:                                                DATE: 08/21/22 Therapeutic Exercise: Nustep L6 UE/LE x 5 minutes Seated lumbar flexion stretch with ball  Standing At spring board : Row 10 x 2 , Pull downs 10 x 2  SKTC 10 sec x 3 each  Bridge x 5 Bridge with march 10 x 2 each  LTR  Supine hamstring stretch  with strap  Supine Alt knee ext from hooklying -45 deg Side clam with blue band 15 x 2  Banded Bridge 10 x 2  Piriformis stretch bilat Figure 4 with LTR   [facility] Adult PT Treatment:                                                DATE: 08/14/2022 Therapeutic Exercise: NuStep L7 x 4 min with UE/LE while taking subjective Figure-4 LTR x 5 each Piriformis stretch 2 x 20 sec each Supine hamstring stretch with strap 3 x 20 sec each Figure-4 bridge 2 x 10 each 90-90 alternating foot tap 2 x 10 Modified side plank on knees 3 x 20 sec each Side clamshell with blue 2 x 15 each Pallof press  with FM 17# 2 x 10 each Split stance RDL FM 17# 2 x 10   [facility] Adult PT Treatment:                                                DATE: 08/04/2022 Therapeutic Exercise: NuStep L6 x 5 min with UE/LE while taking subjective Seated hamstring stretch 3 x 20 sec each Figure-4 LTR x 5 each Piriformis stretch 2 x 20 sec each Bridge with green 2 x 10 Side clamshell with green 2 x 15 each SLR with abdominal engagement 2 x 10  each Hooklying active hamstring stretch x 10 each Pallof press with FM 13# 2 x 10 each  [facility] Adult PT Treatment:                                                DATE: 07/31/2022 Therapeutic Exercise: Bridge 10 x 5 sec Side clamshell with green x 10 Seated hamstring stretch 2 x 20 sec each   PATIENT EDUCATION:  Education details: HEP Person educated: Patient Education method: Programmer, multimedia, Demonstration, Actor cues, Verbal cues Education comprehension: verbalized understanding, returned demonstration, verbal cues required, tactile cues required, and needs further education   HOME EXERCISE PROGRAM: Access Code: [email or web address]      ASSESSMENT: CLINICAL IMPRESSION: Patient tolerated therapy well with no adverse effects. Therapy continues to focus on progressing his flexibility and core strength and control with good tolerance. He does continue to exhibit some hamstring tightness and is progressing well with resistance for core and hip strengthening. He does require cuing for core activation to maintain neutral lumbar spine and he does report some left hip burning with exercises. Reviewed FOTO intake and prediction. Pt reports compliance with HEP. He also reports pain level at end of day decreased to 5-6/10. He has met STG# 1, 2. Patient would benefit from continued skilled PT to progress his mobility and strength in order to reduce pain and maximize functional ability. Pt is interested in exercise ball exercises.      OBJECTIVE IMPAIRMENTS: decreased activity tolerance, decreased ROM, decreased strength, impaired flexibility, postural dysfunction, and pain.    ACTIVITY LIMITATIONS: carrying, lifting, bending, sitting, standing, and locomotion level   PARTICIPATION LIMITATIONS: community activity and occupation   PERSONAL FACTORS: Fitness, Past/current experiences, Profession, and Time since onset of injury/illness/exacerbation are also affecting patient's functional outcome.      GOALS: Goals  reviewed with patient? Yes   SHORT TERM GOALS: Target date: 08/28/2022   Patient will be I with initial HEP in order to progress with therapy. Baseline: HEP provided at eval 08/21/22: compliant and independent Goal status: MET   2.  PT will review FOTO with patient by 3rd visit in order to understand expected progress and outcome with therapy. Baseline: FOTO assessed at eval Goal status: MET   3.  Patient will report low back pain following work </= 5/10 in order to reduce functional limitations Baseline: 8/10 pain 08/21/22: having less pain , average 5-6/10.  Goal status: ONGOING   LONG TERM GOALS: Target date: 09/25/2022   Patient will be I with final HEP to maintain progress from PT. Baseline: HEP provided at eval Goal status: INITIAL   2.  Patient will report >/=  64% status on FOTO to indicate improved functional ability. Baseline: 52% functional status Goal status: INITIAL   3.  Patient will demonstrate left hip strengthening >/= 4/5 MMT and DLLT </= 30 deg in order to indicate improved lumbopelvic control and reduce pain with work related tasks Baseline: hip strength 4-/5 MMT and DLLT to 45 deg Goal status: INITIAL   4.  Patient will demonstrate lumbar flexion WFL and non-painful to improve bending and lifting tasks at work Baseline: lumbar flexion grossly 75% and painful Goal status: INITIAL   5.  Patient will report low back pain following work </= 3/10 in order to reduce functional limitations Baseline: 8/10 pain Goal status: INITIAL     PLAN: PT FREQUENCY: 1-2x/week   PT DURATION: 8 weeks   PLANNED INTERVENTIONS: Therapeutic exercises, Therapeutic activity, Neuromuscular re-education, Balance training, Gait training, Patient/Family education, Self Care, Joint mobilization, Joint manipulation, Aquatic Therapy, Dry Needling, Spinal manipulation, Spinal mobilization, Manual therapy, and Re-evaluation.   PLAN FOR NEXT SESSION: Review HEP and progress PRN, manual/dry  needling for left lumbar and gluteal/piriformis region, hamstring stretching, progress core and hip strengthening   Jannette Spanner, PTA 08/21/22 2:40 PM Phone: [phone] Fax: [phone]

## 2022-08-27 NOTE — Therapy (Signed)
OUTPATIENT PHYSICAL THERAPY TREATMENT NOTE   Patient Name: Andrew Hart MRN: [id number] DOB:16-Sep-1970, 52 y.o., male Today's Date: 08/28/2022  PCP: Eustaquio Boyden, MD REFERRING PROVIDER: Fanny Dance, MD   END OF SESSION:   PT End of Session - 08/28/22 1445     Visit Number 5    Number of Visits 17    Date for PT Re-Evaluation 09/25/22    Authorization Type [employer]    PT Start Time 1445    PT Stop Time 1525    PT Time Calculation (min) 40 min    Activity Tolerance Patient tolerated treatment well    Behavior During Therapy [facility] for tasks assessed/performed              Past Medical History:  Diagnosis Date   Alcohol use 11/08/2015   Anxiety    Aortic aneurysm    Diabetes mellitus without complication 2006   Kumar   GERD (gastroesophageal reflux disease)    Herpes labialis    History of chicken pox    Hyperlipidemia    Kidney stone    Past Surgical History:  Procedure Laterality Date   ABDOMINAL AORTOGRAM W/LOWER EXTREMITY N/A 07/22/2019   Procedure: ABDOMINAL AORTOGRAM W/LOWER EXTREMITY;  Surgeon: Sherren Kerns, MD;  Location: [facility];  Service: Cardiovascular;  Laterality: N/A;   APPENDECTOMY     FINGER SURGERY Right    2 screws in ring finger   PERIPHERAL VASCULAR INTERVENTION Left 07/22/2019   Procedure: PERIPHERAL VASCULAR INTERVENTION;  Surgeon: Sherren Kerns, MD;  Location: [facility];  Service: Cardiovascular;  Laterality: Left;   TONSILLECTOMY     Patient Active Problem List   Diagnosis Date Noted   Pneumothorax, right 09/20/2021   Primary hypertension 09/18/2021   Rib pain on right side 09/18/2021   Chronic left-sided low back pain without sciatica 03/20/2021   S/P insertion of iliac artery stent 09/23/2020   Tinnitus 03/19/2020   Diabetic peripheral vascular disease 09/21/2019   Aorto-iliac atherosclerosis 03/29/2019   Abdominal aortic ectasia 08/11/2017   Macrocytosis 07/31/2017   Prurigo nodularis  12/18/2016   Encounter for general adult medical examination with abnormal findings 06/26/2016   Ex-smoker 06/26/2016   Family history of premature CAD 06/26/2016   Family history of abdominal aortic aneurysm (AAA) 06/26/2016   History of alcohol use 11/08/2015   Erectile dysfunction 09/02/2015   GERD (gastroesophageal reflux disease)    Insomnia 08/06/2015   Adjustment disorder with anxiety 08/06/2015   Type 2 diabetes mellitus with other specified complication 05/18/2015   Hyperlipidemia associated with type 2 diabetes mellitus 05/18/2015    REFERRING DIAG: Chronic left SI joint pain   THERAPY DIAG:  Other low back pain  Muscle weakness (generalized)  Rationale for Evaluation and Treatment Rehabilitation  PERTINENT HISTORY: Abdominal aortic aneurism, DM   PRECAUTIONS: None   SUBJECTIVE:  SUBJECTIVE STATEMENT:  Patient reports he is doing good, he has been busy today.   PAIN:  Are you having pain? Yes:  NPRS scale: 2/10 (8/10 at end of day) Pain location: Lower back and left buttock Pain description: "Jabbing" Aggravating factors: Bending, standing extended periods, lifting Relieving factors: Tylenol, rest, heat works for a little while   OBJECTIVE: (objective measures completed at initial evaluation unless otherwise dated) PATIENT SURVEYS:  FOTO: 52% functional status  MUSCLE LENGTH: Hamstring flexibility deficit   POSTURE:             Decreased lumbar lordosis, rounded shoulders   PALPATION: Tender to palpation left lower lumbar paraspinals, left upper gluteal region; increased low back pain with    LUMBAR ROM:    AROM eval 08/14/2022  Flexion 75% - hamstring tightness 75% - hamstring tightness  Extension WFL   Right lateral flexion WFL   Left lateral flexion WFL   Right rotation  WFL   Left rotation WFL    (Blank rows = not tested)   Patient reports midline low back pain with lumbar flexion  LOWER EXTREMITY MMT:     MMT Right eval Left eval Rt / Lt 08/04/22  Hip flexion 4 4-   Hip extension 4 4-   Hip abduction 4 4- 4 / 4-  Hip adduction       Hip internal rotation       Hip external rotation       Knee flexion 5 5   Knee extension 5 5   Ankle dorsiflexion       Ankle plantarflexion       Ankle inversion       Ankle eversion        (Blank rows = not tested)  FUNCTIONAL TESTS:  DLLT: loss of lumbar control at approximately 45 deg, no report of pain     TODAY'S TREATMENT:   [facility] Adult PT Treatment:                                                DATE: 08/28/22 Therapeutic Exercise: NuStep L7 x 4 min with UE/LE while taking subjective Piriformis stretch push / pull 2 x 20 sec each Figure-4 LTR x 5 each Supine hamstring stretch with strap 2 x 30 sec each Child's pose x 20 sec fwd and each side Cat cow x 5 Marching bridge 2 x 10 each 90-90 alternating leg extension 2 x 10 Modified side plank on knees and clamshell with green 3 x 10 Pallof press with FM 20# 2 x 10 each Single arm row with FM 20# 2 x 10 each Standing chop with bar FM 10# 2 x 10 each   [facility] Adult PT Treatment:                                                DATE: 08/21/22 Therapeutic Exercise: Nustep L6 UE/LE x 5 minutes Seated lumbar flexion stretch with ball  Standing At spring board : Row 10 x 2 , Pull downs 10 x 2  SKTC 10 sec x 3 each  Bridge x 5 Bridge with march 10 x 2 each  LTR  Supine hamstring stretch  with strap  Supine Alt  knee ext from hooklying -45 deg Side clam with blue band 15 x 2  Banded Bridge 10 x 2  Piriformis stretch bilat Figure 4 with LTR  [facility] Adult PT Treatment:                                                DATE: 08/14/2022 Therapeutic Exercise: NuStep L7 x 4 min with UE/LE while taking subjective Figure-4 LTR x 5 each Piriformis stretch 2 x 20  sec each Supine hamstring stretch with strap 3 x 20 sec each Figure-4 bridge 2 x 10 each 90-90 alternating foot tap 2 x 10 Modified side plank on knees 3 x 20 sec each Side clamshell with blue 2 x 15 each Pallof press with FM 17# 2 x 10 each Split stance RDL FM 17# 2 x 10   PATIENT EDUCATION:  Education details: HEP Person educated: Patient Education method: Programmer, multimedia, Demonstration, Tactile cues, Verbal cues Education comprehension: verbalized understanding, returned demonstration, verbal cues required, tactile cues required, and needs further education   HOME EXERCISE PROGRAM: Access Code: [email or web address]      ASSESSMENT: CLINICAL IMPRESSION: Patient tolerated therapy well with no adverse effects. Therapy focused on continued progression of lumbar mobility and core stabilization exercises with good tolerance. Patient does report some pain of the left lower back/hip region with exercises but this resolves once he completes the exercises. He does report that he is able to work longer periods prior to onset of pain and takes less rest time for pain to resolve with work. No changes to HEP this visit. Patient would benefit from continued skilled PT to progress his mobility and strength in order to reduce pain and maximize functional ability. Pt is interested in exercise ball exercises.      OBJECTIVE IMPAIRMENTS: decreased activity tolerance, decreased ROM, decreased strength, impaired flexibility, postural dysfunction, and pain.    ACTIVITY LIMITATIONS: carrying, lifting, bending, sitting, standing, and locomotion level   PARTICIPATION LIMITATIONS: community activity and occupation   PERSONAL FACTORS: Fitness, Past/current experiences, Profession, and Time since onset of injury/illness/exacerbation are also affecting patient's functional outcome.      GOALS: Goals reviewed with patient? Yes   SHORT TERM GOALS: Target date: 08/28/2022   Patient will be I with initial HEP in order to  progress with therapy. Baseline: HEP provided at eval 08/21/22: compliant and independent Goal status: MET   2.  PT will review FOTO with patient by 3rd visit in order to understand expected progress and outcome with therapy. Baseline: FOTO assessed at eval Goal status: MET   3.  Patient will report low back pain following work </= 5/10 in order to reduce functional limitations Baseline: 8/10 pain 08/21/22: having less pain , average 5-6/10.  Goal status: ONGOING   LONG TERM GOALS: Target date: 09/25/2022   Patient will be I with final HEP to maintain progress from PT. Baseline: HEP provided at eval Goal status: INITIAL   2.  Patient will report >/= 64% status on FOTO to indicate improved functional ability. Baseline: 52% functional status Goal status: INITIAL   3.  Patient will demonstrate left hip strengthening >/= 4/5 MMT and DLLT </= 30 deg in order to indicate improved lumbopelvic control and reduce pain with work related tasks Baseline: hip strength 4-/5 MMT and DLLT to 45 deg Goal status: INITIAL   4.  Patient will  demonstrate lumbar flexion WFL and non-painful to improve bending and lifting tasks at work Baseline: lumbar flexion grossly 75% and painful Goal status: INITIAL   5.  Patient will report low back pain following work </= 3/10 in order to reduce functional limitations Baseline: 8/10 pain Goal status: INITIAL     PLAN: PT FREQUENCY: 1-2x/week   PT DURATION: 8 weeks   PLANNED INTERVENTIONS: Therapeutic exercises, Therapeutic activity, Neuromuscular re-education, Balance training, Gait training, Patient/Family education, Self Care, Joint mobilization, Joint manipulation, Aquatic Therapy, Dry Needling, Spinal manipulation, Spinal mobilization, Manual therapy, and Re-evaluation.   PLAN FOR NEXT SESSION: Review HEP and progress PRN, manual/dry needling for left lumbar and gluteal/piriformis region, hamstring stretching, progress core and hip  strengthening   Rosana Hoes, PT, DPT, LAT, ATC 08/28/22  3:51 PM Phone: [phone] Fax: [phone]

## 2022-08-28 ENCOUNTER — Encounter: Payer: Self-pay | Admitting: Physical Therapy

## 2022-08-28 ENCOUNTER — Ambulatory Visit: Payer: 59 | Admitting: Physical Therapy

## 2022-08-28 ENCOUNTER — Other Ambulatory Visit: Payer: Self-pay

## 2022-08-28 DIAGNOSIS — M5459 Other low back pain: Secondary | ICD-10-CM

## 2022-08-28 DIAGNOSIS — M6281 Muscle weakness (generalized): Secondary | ICD-10-CM | POA: Diagnosis not present

## 2022-09-02 NOTE — Therapy (Signed)
OUTPATIENT PHYSICAL THERAPY TREATMENT NOTE   Patient Name: Andrew Hart MRN: [id number] DOB:03/28/1971, 52 y.o., male Today's Date: 09/03/2022  PCP: Eustaquio Boyden, MD REFERRING PROVIDER: Fanny Dance, MD   END OF SESSION:   PT End of Session - 09/03/22 1537     Visit Number 6    Number of Visits 17    Date for PT Re-Evaluation 09/25/22    Authorization Type [employer]    PT Start Time 1530    PT Stop Time 1615    PT Time Calculation (min) 45 min    Activity Tolerance Patient tolerated treatment well    Behavior During Therapy [facility] for tasks assessed/performed               Past Medical History:  Diagnosis Date   Alcohol use 11/08/2015   Anxiety    Aortic aneurysm (HCC)    Diabetes mellitus without complication (HCC) 2006   Kumar   GERD (gastroesophageal reflux disease)    Herpes labialis    History of chicken pox    Hyperlipidemia    Kidney stone    Past Surgical History:  Procedure Laterality Date   ABDOMINAL AORTOGRAM W/LOWER EXTREMITY N/A 07/22/2019   Procedure: ABDOMINAL AORTOGRAM W/LOWER EXTREMITY;  Surgeon: Sherren Kerns, MD;  Location: [facility];  Service: Cardiovascular;  Laterality: N/A;   APPENDECTOMY     FINGER SURGERY Right    2 screws in ring finger   PERIPHERAL VASCULAR INTERVENTION Left 07/22/2019   Procedure: PERIPHERAL VASCULAR INTERVENTION;  Surgeon: Sherren Kerns, MD;  Location: [facility];  Service: Cardiovascular;  Laterality: Left;   TONSILLECTOMY     Patient Active Problem List   Diagnosis Date Noted   Pneumothorax, right 09/20/2021   Primary hypertension 09/18/2021   Rib pain on right side 09/18/2021   Chronic left-sided low back pain without sciatica 03/20/2021   S/P insertion of iliac artery stent 09/23/2020   Tinnitus 03/19/2020   Diabetic peripheral vascular disease (HCC) 09/21/2019   Aorto-iliac atherosclerosis (HCC) 03/29/2019   Abdominal aortic ectasia (HCC) 08/11/2017   Macrocytosis 07/31/2017    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   Encounter for general adult medical examination with abnormal findings 06/26/2016   Ex-smoker 06/26/2016   Family history of premature CAD 06/26/2016   Family history of abdominal aortic aneurysm (AAA) 06/26/2016   History of alcohol use 11/08/2015   Erectile dysfunction 09/02/2015   GERD (gastroesophageal reflux disease)    Insomnia 08/06/2015   Adjustment disorder with anxiety 08/06/2015   Type 2 diabetes mellitus with other specified complication (HCC) 05/18/2015   Hyperlipidemia associated with type 2 diabetes mellitus (HCC) 05/18/2015    REFERRING DIAG: Chronic left SI joint pain   THERAPY DIAG:  Other low back pain  Muscle weakness (generalized)  Rationale for Evaluation and Treatment Rehabilitation  PERTINENT HISTORY: Abdominal aortic aneurism, DM   PRECAUTIONS: None   SUBJECTIVE:  SUBJECTIVE STATEMENT:  Patient reports he is doing well, feels he is still getting better.   PAIN:  Are you having pain? Yes:  NPRS scale: 2/10 (8/10 at end of day) Pain location: Lower back and left buttock Pain description: "Jabbing" Aggravating factors: Bending, standing extended periods, lifting Relieving factors: Tylenol, rest, heat works for a little while   OBJECTIVE: (objective measures completed at initial evaluation unless otherwise dated) PATIENT SURVEYS:  FOTO: 52% functional status  09/03/2022: 83%  MUSCLE LENGTH: Hamstring flexibility deficit   POSTURE:             Decreased lumbar lordosis, rounded shoulders   PALPATION: Tender to palpation left lower lumbar paraspinals, left upper gluteal region; increased low back pain with    LUMBAR ROM:    AROM eval 08/14/2022  Flexion 75% - hamstring tightness 75% - hamstring tightness  Extension WFL   Right lateral  flexion WFL   Left lateral flexion WFL   Right rotation WFL   Left rotation WFL    (Blank rows = not tested)   Patient reports midline low back pain with lumbar flexion  LOWER EXTREMITY MMT:     MMT Right eval Left eval Rt / Lt 08/04/22  Hip flexion 4 4-   Hip extension 4 4-   Hip abduction 4 4- 4 / 4-  Hip adduction       Hip internal rotation       Hip external rotation       Knee flexion 5 5   Knee extension 5 5   Ankle dorsiflexion       Ankle plantarflexion       Ankle inversion       Ankle eversion        (Blank rows = not tested)  FUNCTIONAL TESTS:  DLLT: loss of lumbar control at approximately 45 deg, no report of pain     TODAY'S TREATMENT:   [facility] Adult PT Treatment:                                                DATE: 09/03/22 Therapeutic Exercise: NuStep L7 x 4 min with UE/LE while taking subjective Piriformis stretch push / pull 2 x 20 sec each LTR x 5 Sidelying thoracolumbar rotation x 5 each Supine hamstring stretch with strap 2 x 30 sec each Figure-4 bridge 2 x 10 each 90-90 alternating leg extension 2 x 10 Pallof press with blue 2 x 10 each Deadlift with 45# 3 x 10   [facility] Adult PT Treatment:                                                DATE: 08/28/22 Therapeutic Exercise: NuStep L7 x 4 min with UE/LE while taking subjective Piriformis stretch push / pull 2 x 20 sec each Figure-4 LTR x 5 each Supine hamstring stretch with strap 2 x 30 sec each Child's pose x 20 sec fwd and each side Cat cow x 5 Marching bridge 2 x 10 each 90-90 alternating leg extension 2 x 10 Modified side plank on knees and clamshell with green 3 x 10 Pallof press with FM 20# 2 x 10 each Single arm row with FM 20# 2 x  10 each Standing chop with bar FM 10# 2 x 10 each  [facility] Adult PT Treatment:                                                DATE: 08/21/22 Therapeutic Exercise: Nustep L6 UE/LE x 5 minutes Seated lumbar flexion stretch with ball  Standing At spring board : Row  10 x 2 , Pull downs 10 x 2  SKTC 10 sec x 3 each  Bridge x 5 Bridge with march 10 x 2 each  LTR  Supine hamstring stretch  with strap  Supine Alt knee ext from hooklying -45 deg Side clam with blue band 15 x 2  Banded Bridge 10 x 2  Piriformis stretch bilat Figure 4 with LTR   PATIENT EDUCATION:  Education details: HEP update Person educated: Patient Education method: Explanation, Demonstration, Tactile cues, Verbal cues, Handout Education comprehension: verbalized understanding, returned demonstration, verbal cues required, tactile cues required, and needs further education   HOME EXERCISE PROGRAM: Access Code: [email or web address]      ASSESSMENT: CLINICAL IMPRESSION: Patient tolerated therapy well with no adverse effects. He reports great improvement in functional ability this visit achieving his FOTO goal. Therapy continues to focus on progress flexibility and core stabilization exercises with good tolerance. Initiated lifting this visit and patient did require cueing for proper hip hinge lifting mechanics. Updated his HEP to progress strengthening for home. Patient would benefit from continued skilled PT to progress his mobility and strength in order to reduce pain and maximize functional ability. Pt is interested in exercise ball exercises.      OBJECTIVE IMPAIRMENTS: decreased activity tolerance, decreased ROM, decreased strength, impaired flexibility, postural dysfunction, and pain.    ACTIVITY LIMITATIONS: carrying, lifting, bending, sitting, standing, and locomotion level   PARTICIPATION LIMITATIONS: community activity and occupation   PERSONAL FACTORS: Fitness, Past/current experiences, Profession, and Time since onset of injury/illness/exacerbation are also affecting patient's functional outcome.      GOALS: Goals reviewed with patient? Yes   SHORT TERM GOALS: Target date: 08/28/2022   Patient will be I with initial HEP in order to progress with therapy. Baseline: HEP  provided at eval 08/21/22: compliant and independent Goal status: MET   2.  PT will review FOTO with patient by 3rd visit in order to understand expected progress and outcome with therapy. Baseline: FOTO assessed at eval Goal status: MET   3.  Patient will report low back pain following work </= 5/10 in order to reduce functional limitations Baseline: 8/10 pain 08/21/22: having less pain , average 5-6/10.  Goal status: ONGOING   LONG TERM GOALS: Target date: 09/25/2022   Patient will be I with final HEP to maintain progress from PT. Baseline: HEP provided at eval Goal status: INITIAL   2.  Patient will report >/= 64% status on FOTO to indicate improved functional ability. Baseline: 52% functional status 09/03/2022: 83% Goal status: MET   3.  Patient will demonstrate left hip strengthening >/= 4/5 MMT and DLLT </= 30 deg in order to indicate improved lumbopelvic control and reduce pain with work related tasks Baseline: hip strength 4-/5 MMT and DLLT to 45 deg Goal status: INITIAL   4.  Patient will demonstrate lumbar flexion WFL and non-painful to improve bending and lifting tasks at work Baseline: lumbar flexion grossly 75% and painful Goal status: INITIAL  5.  Patient will report low back pain following work </= 3/10 in order to reduce functional limitations Baseline: 8/10 pain Goal status: INITIAL     PLAN: PT FREQUENCY: 1-2x/week   PT DURATION: 8 weeks   PLANNED INTERVENTIONS: Therapeutic exercises, Therapeutic activity, Neuromuscular re-education, Balance training, Gait training, Patient/Family education, Self Care, Joint mobilization, Joint manipulation, Aquatic Therapy, Dry Needling, Spinal manipulation, Spinal mobilization, Manual therapy, and Re-evaluation.   PLAN FOR NEXT SESSION: Review HEP and progress PRN, manual/dry needling for left lumbar and gluteal/piriformis region, hamstring stretching, progress core and hip strengthening   Rosana Hoes, PT, DPT, LAT,  ATC 09/03/22  5:10 PM Phone: [phone] Fax: [phone]

## 2022-09-03 ENCOUNTER — Encounter: Payer: Self-pay | Admitting: Physical Therapy

## 2022-09-03 ENCOUNTER — Other Ambulatory Visit: Payer: Self-pay

## 2022-09-03 ENCOUNTER — Ambulatory Visit: Payer: 59 | Attending: Family Medicine | Admitting: Physical Therapy

## 2022-09-03 DIAGNOSIS — M6281 Muscle weakness (generalized): Secondary | ICD-10-CM | POA: Diagnosis not present

## 2022-09-03 DIAGNOSIS — M5459 Other low back pain: Secondary | ICD-10-CM

## 2022-09-03 NOTE — Patient Instructions (Signed)
Access Code: [email or web address] URL: [email or web address] Date: 09/03/2022 Prepared by: Rosana Hoes  Exercises - Supine Lower Trunk Rotation  - 1 x daily - 10 reps - Supine Piriformis Stretch with Foot on Ground  - 1 x daily - 3 reps - 30 seconds hold - Supine Figure 4 Piriformis Stretch  - 1 x daily - 3 reps - 30 seconds hold - Bridge  - 1 x daily - 2 sets - 10 reps - Straight Leg Raise  - 1 x daily - 2 sets - 10 reps - Clam with Resistance  - 1 x daily - 2 sets - 15 reps - Sidelying Thoracic Lumbar Rotation  - 1 x daily - 10 reps - Seated Hamstring Stretch  - 1 x daily - 3 reps - 30 seconds hold - Sit to Stand Without Arm Support  - 1 x daily - 3 sets - 10 reps - Standing Anti-Rotation Press with Anchored Resistance  - 1 x daily - 2 sets - 10 reps - Standing Gastroc Stretch at Counter  - 1 x daily - 3 reps - 30 seconds hold

## 2022-09-09 DIAGNOSIS — E119 Type 2 diabetes mellitus without complications: Secondary | ICD-10-CM | POA: Diagnosis not present

## 2022-09-09 LAB — HM DIABETES EYE EXAM

## 2022-09-11 ENCOUNTER — Ambulatory Visit: Payer: 59 | Admitting: Physical Therapy

## 2022-09-11 ENCOUNTER — Encounter: Payer: Self-pay | Admitting: Physical Therapy

## 2022-09-11 ENCOUNTER — Other Ambulatory Visit: Payer: Self-pay

## 2022-09-11 DIAGNOSIS — M6281 Muscle weakness (generalized): Secondary | ICD-10-CM

## 2022-09-11 DIAGNOSIS — M5459 Other low back pain: Secondary | ICD-10-CM

## 2022-09-11 NOTE — Therapy (Signed)
OUTPATIENT PHYSICAL THERAPY TREATMENT NOTE   Patient Name: Andrew Hart MRN: [id number] DOB:November 18, 1970, 52 y.o., male Today's Date: 09/11/2022  PCP: Eustaquio Boyden, MD REFERRING PROVIDER: Fanny Dance, MD   END OF SESSION:   PT End of Session - 09/11/22 1408     Visit Number 7    Number of Visits 17    Date for PT Re-Evaluation 09/25/22    Authorization Type [employer]    PT Start Time 1403    PT Stop Time 1445    PT Time Calculation (min) 42 min    Activity Tolerance Patient tolerated treatment well    Behavior During Therapy [facility] for tasks assessed/performed                Past Medical History:  Diagnosis Date   Alcohol use 11/08/2015   Anxiety    Aortic aneurysm (HCC)    Diabetes mellitus without complication (HCC) 2006   Kumar   GERD (gastroesophageal reflux disease)    Herpes labialis    History of chicken pox    Hyperlipidemia    Kidney stone    Past Surgical History:  Procedure Laterality Date   ABDOMINAL AORTOGRAM W/LOWER EXTREMITY N/A 07/22/2019   Procedure: ABDOMINAL AORTOGRAM W/LOWER EXTREMITY;  Surgeon: Sherren Kerns, MD;  Location: [facility];  Service: Cardiovascular;  Laterality: N/A;   APPENDECTOMY     FINGER SURGERY Right    2 screws in ring finger   PERIPHERAL VASCULAR INTERVENTION Left 07/22/2019   Procedure: PERIPHERAL VASCULAR INTERVENTION;  Surgeon: Sherren Kerns, MD;  Location: [facility];  Service: Cardiovascular;  Laterality: Left;   TONSILLECTOMY     Patient Active Problem List   Diagnosis Date Noted   Pneumothorax, right 09/20/2021   Primary hypertension 09/18/2021   Rib pain on right side 09/18/2021   Chronic left-sided low back pain without sciatica 03/20/2021   S/P insertion of iliac artery stent 09/23/2020   Tinnitus 03/19/2020   Diabetic peripheral vascular disease (HCC) 09/21/2019   Aorto-iliac atherosclerosis (HCC) 03/29/2019   Abdominal aortic ectasia (HCC) 08/11/2017   Macrocytosis  07/31/2017   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   Encounter for general adult medical examination with abnormal findings 06/26/2016   Ex-smoker 06/26/2016   Family history of premature CAD 06/26/2016   Family history of abdominal aortic aneurysm (AAA) 06/26/2016   History of alcohol use 11/08/2015   Erectile dysfunction 09/02/2015   GERD (gastroesophageal reflux disease)    Insomnia 08/06/2015   Adjustment disorder with anxiety 08/06/2015   Type 2 diabetes mellitus with other specified complication (HCC) 05/18/2015   Hyperlipidemia associated with type 2 diabetes mellitus (HCC) 05/18/2015    REFERRING DIAG: Chronic left SI joint pain   THERAPY DIAG:  Other low back pain  Muscle weakness (generalized)  Rationale for Evaluation and Treatment Rehabilitation  PERTINENT HISTORY: Abdominal aortic aneurism, DM   PRECAUTIONS: None   SUBJECTIVE:  SUBJECTIVE STATEMENT:  Patient reports he is doing well, feels he is still getting better.   PAIN:  Are you having pain? Yes:  NPRS scale: 2/10 (8/10 at end of day) Pain location: Lower back and left buttock Pain description: "Jabbing" Aggravating factors: Bending, standing extended periods, lifting Relieving factors: Tylenol, rest, heat works for a little while   OBJECTIVE: (objective measures completed at initial evaluation unless otherwise dated) PATIENT SURVEYS:  FOTO: 52% functional status  09/03/2022: 83%  MUSCLE LENGTH: Hamstring flexibility deficit   POSTURE:             Decreased lumbar lordosis, rounded shoulders   PALPATION: Tender to palpation left lower lumbar paraspinals, left upper gluteal region; increased low back pain with    LUMBAR ROM:    AROM eval 08/14/2022  Flexion 75% - hamstring tightness 75% - hamstring tightness  Extension WFL    Right lateral flexion WFL   Left lateral flexion WFL   Right rotation WFL   Left rotation WFL    (Blank rows = not tested)   Patient reports midline low back pain with lumbar flexion  LOWER EXTREMITY MMT:     MMT Right eval Left eval Rt / Lt 08/04/22  Hip flexion 4 4-   Hip extension 4 4-   Hip abduction 4 4- 4 / 4-  Hip adduction       Hip internal rotation       Hip external rotation       Knee flexion 5 5   Knee extension 5 5   Ankle dorsiflexion       Ankle plantarflexion       Ankle inversion       Ankle eversion        (Blank rows = not tested)  FUNCTIONAL TESTS:  DLLT: loss of lumbar control at approximately 45 deg, no report of pain     TODAY'S TREATMENT:   [facility] Adult PT Treatment:                                                DATE: 09/11/22 Therapeutic Exercise: NuStep L7 x 4 min with UE/LE while taking subjective Piriformis stretch push / pull 2 x 20 sec each Supine hamstring stretch with strap 2 x 30 sec each Sidelying thoracolumbar rotation x 5 each Figure-4 bridge 2 x 10 each Side plank lift 2 x 10 each Front plank 2 x 3 with 15 sec hold Standing chop with bar FM 10# 2 x 10 each Hex bar deadlift 75# 4 x 6   [facility] Adult PT Treatment:                                                DATE: 09/03/22 Therapeutic Exercise: NuStep L7 x 4 min with UE/LE while taking subjective Piriformis stretch push / pull 2 x 20 sec each LTR x 5 Sidelying thoracolumbar rotation x 5 each Supine hamstring stretch with strap 2 x 30 sec each Figure-4 bridge 2 x 10 each 90-90 alternating leg extension 2 x 10 Pallof press with blue 2 x 10 each Deadlift with 45# 3 x 10  [facility] Adult PT Treatment:  DATE: 08/28/22 Therapeutic Exercise: NuStep L7 x 4 min with UE/LE while taking subjective Piriformis stretch push / pull 2 x 20 sec each Figure-4 LTR x 5 each Supine hamstring stretch with strap 2 x 30 sec each Child's pose x 20 sec fwd and  each side Cat cow x 5 Marching bridge 2 x 10 each 90-90 alternating leg extension 2 x 10 Modified side plank on knees and clamshell with green 3 x 10 Pallof press with FM 20# 2 x 10 each Single arm row with FM 20# 2 x 10 each Standing chop with bar FM 10# 2 x 10 each  [facility] Adult PT Treatment:                                                DATE: 08/21/22 Therapeutic Exercise: Nustep L6 UE/LE x 5 minutes Seated lumbar flexion stretch with ball  Standing At spring board : Row 10 x 2 , Pull downs 10 x 2  SKTC 10 sec x 3 each  Bridge x 5 Bridge with march 10 x 2 each  LTR  Supine hamstring stretch  with strap  Supine Alt knee ext from hooklying -45 deg Side clam with blue band 15 x 2  Banded Bridge 10 x 2  Piriformis stretch bilat Figure 4 with LTR   PATIENT EDUCATION:  Education details: HEP Person educated: Patient Education method: Programmer, multimedia, Demonstration, Actor cues, Verbal cues Education comprehension: verbalized understanding, returned demonstration, verbal cues required, tactile cues required, and needs further education   HOME EXERCISE PROGRAM: Access Code: [email or web address]      ASSESSMENT: CLINICAL IMPRESSION: Patient tolerated therapy well with no adverse effects. Therapy focused on continued progression of core stabilization and strengthening with good tolerance. He was able to progress with planks and deadlifts. He did note feeling some discomfort with working earlier today, but able to complete all exercises and noted overall improvement in his symptoms. No changes to HEP this visit. Patient would benefit from continued skilled PT to progress his mobility and strength in order to reduce pain and maximize functional ability.     OBJECTIVE IMPAIRMENTS: decreased activity tolerance, decreased ROM, decreased strength, impaired flexibility, postural dysfunction, and pain.    ACTIVITY LIMITATIONS: carrying, lifting, bending, sitting, standing, and locomotion level    PARTICIPATION LIMITATIONS: community activity and occupation   PERSONAL FACTORS: Fitness, Past/current experiences, Profession, and Time since onset of injury/illness/exacerbation are also affecting patient's functional outcome.      GOALS: Goals reviewed with patient? Yes   SHORT TERM GOALS: Target date: 08/28/2022   Patient will be I with initial HEP in order to progress with therapy. Baseline: HEP provided at eval 08/21/22: compliant and independent Goal status: MET   2.  PT will review FOTO with patient by 3rd visit in order to understand expected progress and outcome with therapy. Baseline: FOTO assessed at eval Goal status: MET   3.  Patient will report low back pain following work </= 5/10 in order to reduce functional limitations Baseline: 8/10 pain 08/21/22: having less pain , average 5-6/10.  Goal status: ONGOING   LONG TERM GOALS: Target date: 09/25/2022   Patient will be I with final HEP to maintain progress from PT. Baseline: HEP provided at eval Goal status: INITIAL   2.  Patient will report >/= 64% status on FOTO to indicate improved functional ability.  Baseline: 52% functional status 09/03/2022: 83% Goal status: MET   3.  Patient will demonstrate left hip strengthening >/= 4/5 MMT and DLLT </= 30 deg in order to indicate improved lumbopelvic control and reduce pain with work related tasks Baseline: hip strength 4-/5 MMT and DLLT to 45 deg Goal status: INITIAL   4.  Patient will demonstrate lumbar flexion WFL and non-painful to improve bending and lifting tasks at work Baseline: lumbar flexion grossly 75% and painful Goal status: INITIAL   5.  Patient will report low back pain following work </= 3/10 in order to reduce functional limitations Baseline: 8/10 pain Goal status: INITIAL     PLAN: PT FREQUENCY: 1-2x/week   PT DURATION: 8 weeks   PLANNED INTERVENTIONS: Therapeutic exercises, Therapeutic activity, Neuromuscular re-education, Balance training,  Gait training, Patient/Family education, Self Care, Joint mobilization, Joint manipulation, Aquatic Therapy, Dry Needling, Spinal manipulation, Spinal mobilization, Manual therapy, and Re-evaluation.   PLAN FOR NEXT SESSION: Review HEP and progress PRN, manual/dry needling for left lumbar and gluteal/piriformis region, hamstring stretching, progress core and hip strengthening   Rosana Hoes, PT, DPT, LAT, ATC 09/11/22  2:47 PM Phone: [phone] Fax: [phone]

## 2022-09-16 ENCOUNTER — Encounter: Payer: Self-pay | Admitting: Physical Medicine & Rehabilitation

## 2022-09-16 ENCOUNTER — Encounter: Payer: 59 | Attending: Physical Medicine & Rehabilitation | Admitting: Physical Medicine & Rehabilitation

## 2022-09-16 VITALS — BP 132/89 | HR 66 | Ht 69.0 in | Wt 182.0 lb

## 2022-09-16 DIAGNOSIS — F411 Generalized anxiety disorder: Secondary | ICD-10-CM

## 2022-09-16 DIAGNOSIS — M533 Sacrococcygeal disorders, not elsewhere classified: Secondary | ICD-10-CM

## 2022-09-16 DIAGNOSIS — G8929 Other chronic pain: Secondary | ICD-10-CM | POA: Diagnosis not present

## 2022-09-16 NOTE — Progress Notes (Signed)
Subjective:    Patient ID: Andrew Hart, male    DOB: Dec 11, 1970, 52 y.o.   MRN: [id number]  HPI   HPI Andrew Hart is a 52 y.o. year old male  who  has a past medical history of Alcohol use (11/08/2015), Anxiety, Aortic aneurysm (HCC), Diabetes mellitus without complication (HCC) (2006), GERD (gastroesophageal reflux disease), Herpes labialis, History of chicken pox, Hyperlipidemia, and Kidney stone.   They are presenting to PM&R clinic as a new patient for pain management evaluation. They were referred  for treatment of left SI joint chronic pain.  He reports that he developed pain in his back about a year and a half ago.  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  Pain is worsened by activities and improved with rest.  Heat also helps this pain.  He was seen by his PCP who noted that he had pain at his left SI joint.  He sees a Land.  Occasionally the pain will shoot across his left anterior hip.   Red flag symptoms: Patient denies saddle anesthesia, loss of bowel or bladder continence, new weakness, new numbness/tingling, or pain waking up at nighttime.   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  His SI joint injection was declined by insurance.  Patient admits he had some reservation about having an injection in his back, although he was planning to try this had not been approved.  He feels that his pain is little better since he replaced his prior bed with an adjustable bed frame.  He has minimal improvement with Robaxin.  He does not take NSAIDs due to GI upset.      Interval History 09/16/22 Andrew Hart reports his pain is improved with PT. He has one more visit scheduled. He has been taught home exercises.  Reports pain now controlled. He sometimes uses flexeril with benefit. Voltaren gel also beneficial.   Pain Inventory Average Pain 2 Pain Right Now 1 My pain is dull  In the last 24 hours, has pain interfered with the following? General activity 1 Relation with others 0 Enjoyment of life 0 What TIME of day is your pain at its worst? evening Sleep (in general) Fair  Pain is worse with: bending Pain improves with: rest and medication Relief from Meds: 7  Family History  Problem Relation Age of Onset   Diabetes Mother    Parkinsonism Mother 74       progressive supranuclear palsy, deceased   CAD Father 33       MI at 52, CABG at 31   Diabetes Maternal Grandmother    Stroke Neg Hx    Cancer Neg Hx    Colon cancer Neg Hx    Esophageal cancer Neg Hx    Rectal cancer Neg Hx    Stomach cancer Neg Hx    Social History   Socioeconomic History   Marital status: Married    Spouse name: Not on file   Number of children: Not on file   Years of education: Not on file   Highest education level:  Not on file  Occupational History   Not on file  Tobacco Use   Smoking status: Former    Packs/day: 0.50    Years: 15.00    Additional pack years: 0.00    Total pack years: 7.50    Types: Cigarettes    Quit date: 02/03/2019    Years since quitting: 3.6    Passive exposure: Never   Smokeless tobacco: Never  Vaping Use   Vaping Use: Never used  Substance and Sexual Activity   Alcohol use: Not Currently    Alcohol/week: 0.0 standard drinks of alcohol    Comment: Regular beer (12 pk/wkend)   Drug use: Not Currently    Comment: MJ (occasional)   Sexual activity: Not on file  Other Topics Concern   Not on file  Social History Narrative   Lives with wife and 2 children (daughter 2003, son 48)    Occ: owns Education officer, environmental business    Activity:  active at work, at home on yard   Diet: some water, fruits/vegetables daily    Social Determinants of Health   Financial Resource Strain: Not on file  Food Insecurity: Not on file  Transportation Needs: Not on file  Physical Activity: Not on file  Stress: Not on file  Social Connections: Not on file   Past Surgical History:  Procedure Laterality Date   ABDOMINAL AORTOGRAM W/LOWER EXTREMITY N/A 07/22/2019   Procedure: ABDOMINAL AORTOGRAM W/LOWER EXTREMITY;  Surgeon: Sherren Kerns, MD;  Location: [facility];  Service: Cardiovascular;  Laterality: N/A;   APPENDECTOMY     FINGER SURGERY Right    2 screws in ring finger   PERIPHERAL VASCULAR INTERVENTION Left 07/22/2019   Procedure: PERIPHERAL VASCULAR INTERVENTION;  Surgeon: Sherren Kerns, MD;  Location: [facility];  Service: Cardiovascular;  Laterality: Left;   TONSILLECTOMY     Past Surgical History:  Procedure Laterality Date   ABDOMINAL AORTOGRAM W/LOWER EXTREMITY N/A 07/22/2019   Procedure: ABDOMINAL AORTOGRAM W/LOWER EXTREMITY;  Surgeon: Sherren Kerns, MD;  Location: [facility];  Service: Cardiovascular;  Laterality: N/A;   APPENDECTOMY     FINGER SURGERY Right    2 screws in ring finger   PERIPHERAL VASCULAR INTERVENTION Left 07/22/2019   Procedure: PERIPHERAL VASCULAR INTERVENTION;  Surgeon: Sherren Kerns, MD;  Location: [facility];  Service: Cardiovascular;  Laterality: Left;   TONSILLECTOMY     Past Medical History:  Diagnosis Date   Alcohol use 11/08/2015   Anxiety    Aortic aneurysm (HCC)    Diabetes mellitus without complication (HCC) 2006   Kumar   GERD (gastroesophageal reflux disease)    Herpes labialis    History of chicken pox    Hyperlipidemia    Kidney stone    BP (!) 161/84   Pulse 66   Ht 5\' 9"  (1.753 m)   Wt 182 lb (82.6 kg)   SpO2 96%   BMI 26.88 kg/m   Opioid Risk Score:   Fall Risk Score:  `1  Depression screen [facility] 2/9     09/16/2022   10:36  AM 07/10/2022   11:15 AM 05/30/2022    3:47 PM 05/23/2022    1:01 PM 03/24/2022   10:52 AM 03/24/2022   10:21 AM 03/20/2021    9:38 AM  Depression screen PHQ 2/9  Decreased Interest 0 0 0 0 1 0 0  Down, Depressed, Hopeless 0 0 0 0 1 0 0  PHQ - 2 Score  0 0 0 0 2 0 0  Altered sleeping    1 0  1  Tired, decreased energy    1 1  1   Change in appetite    1 0  1  Feeling bad or failure about yourself     1 1  0  Trouble concentrating    0 0  1  Moving slowly or fidgety/restless    0 0  0  Suicidal thoughts    0 0  0  PHQ-9 Score    4 4  4   Difficult doing work/chores    Somewhat difficult        Review of Systems  Musculoskeletal:        Rt elbow pain LT hip pain  All other systems reviewed and are negative.     Objective:   Physical Exam   Gen: no distress, normal appearing HEENT: oral mucosa pink and moist, NCAT Cardio: Reg rate Chest: normal effort, normal rate of breathing Abd: soft, non-distended Ext: no edema Psych: pleasant, normal affect Skin: intact Neuro: Alert and oriented, follows commands, cranial nerves II through XII intact, normal speech and language Sensation intact light touch in all 4 extremities Strength 5 out of 5 in all 4 extremities No ataxia or dysmetria Musculoskeletal:  Tenderness over left SI joint Minimal tenderness paraspinal muscles of the lumbar spine, mostly on the left Forton finger- L SI joint FABER positive on the left - mild  SI/iliac compression negative Gaenslen's neg Distraction test negative today Yeomans negative Hip thrust test negative today Slightly decreased L-spine motion in all directions      Assessment & Plan:  Chronic lower back pain -Pain improved with PT- reports 1 more visit, continue home exercise after complete -Prior exam indicated SI joint dysfunction, SI joint injection not approved by insurance -I think he may also be having some tenderness of his piriformis and this could be associated with SI joint  dysfunction -Consider reordering injection SI joint at a later time if pain worsens -Continue PRN flexeril 5mg  - reports benefit -Voltaren gel ordered for L SI joint- continue -Discussed trying SI joint belt -Consider Tens unit Nexwave/Cryoheat if worsens     Anxiety -He is on celexa, could consider duloxetine for pain however would have to dc celexa first

## 2022-09-16 NOTE — Therapy (Signed)
OUTPATIENT PHYSICAL THERAPY TREATMENT NOTE  DISCHARGE   Patient Name: Andrew Hart MRN: [id number] DOB:August 05, 1970, 52 y.o., male Today's Date: 09/17/2022  PCP: Eustaquio Boyden, MD REFERRING PROVIDER: Fanny Dance, MD   END OF SESSION:   PT End of Session - 09/17/22 1535     Visit Number 8    Number of Visits 17    Date for PT Re-Evaluation 09/25/22    Authorization Type [employer]    PT Start Time 1533    PT Stop Time 1612    PT Time Calculation (min) 39 min    Activity Tolerance Patient tolerated treatment well    Behavior During Therapy [facility] for tasks assessed/performed                 Past Medical History:  Diagnosis Date   Alcohol use 11/08/2015   Anxiety    Aortic aneurysm (HCC)    Diabetes mellitus without complication (HCC) 2006   Kumar   GERD (gastroesophageal reflux disease)    Herpes labialis    History of chicken pox    Hyperlipidemia    Kidney stone    Past Surgical History:  Procedure Laterality Date   ABDOMINAL AORTOGRAM W/LOWER EXTREMITY N/A 07/22/2019   Procedure: ABDOMINAL AORTOGRAM W/LOWER EXTREMITY;  Surgeon: Sherren Kerns, MD;  Location: [facility];  Service: Cardiovascular;  Laterality: N/A;   APPENDECTOMY     FINGER SURGERY Right    2 screws in ring finger   PERIPHERAL VASCULAR INTERVENTION Left 07/22/2019   Procedure: PERIPHERAL VASCULAR INTERVENTION;  Surgeon: Sherren Kerns, MD;  Location: [facility];  Service: Cardiovascular;  Laterality: Left;   TONSILLECTOMY     Patient Active Problem List   Diagnosis Date Noted   Pneumothorax, right 09/20/2021   Primary hypertension 09/18/2021   Rib pain on right side 09/18/2021   Chronic left-sided low back pain without sciatica 03/20/2021   S/P insertion of iliac artery stent 09/23/2020   Tinnitus 03/19/2020   Diabetic peripheral vascular disease (HCC) 09/21/2019   Aorto-iliac atherosclerosis (HCC) 03/29/2019   Abdominal aortic ectasia (HCC) 08/11/2017    Macrocytosis 07/31/2017   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   Encounter for general adult medical examination with abnormal findings 06/26/2016   Ex-smoker 06/26/2016   Family history of premature CAD 06/26/2016   Family history of abdominal aortic aneurysm (AAA) 06/26/2016   History of alcohol use 11/08/2015   Erectile dysfunction 09/02/2015   GERD (gastroesophageal reflux disease)    Insomnia 08/06/2015   Adjustment disorder with anxiety 08/06/2015   Type 2 diabetes mellitus with other specified complication (HCC) 05/18/2015   Hyperlipidemia associated with type 2 diabetes mellitus (HCC) 05/18/2015    REFERRING DIAG: Chronic left SI joint pain   THERAPY DIAG:  Other low back pain  Muscle weakness (generalized)  Rationale for Evaluation and Treatment Rehabilitation  PERTINENT HISTORY: Abdominal aortic aneurism, DM   PRECAUTIONS: None    SUBJECTIVE:  SUBJECTIVE STATEMENT:  Patient reports he is doing well. He is tired from working the past few days. He states he feels like he is ready for today to be his last visit.  PAIN:  Are you having pain? Yes:  NPRS scale: 2/10 (5/10 with work) Pain location: Lower back and left buttock Pain description: "Jabbing" Aggravating factors: Bending, standing extended periods, lifting Relieving factors: Tylenol, rest, heat works for a little while   OBJECTIVE: (objective measures completed at initial evaluation unless otherwise dated) PATIENT SURVEYS:  FOTO: 52% functional status  09/03/2022: 83%  MUSCLE LENGTH: Hamstring flexibility deficit   POSTURE:             Decreased lumbar lordosis, rounded shoulders   PALPATION: Tender to palpation left lower lumbar paraspinals, left upper gluteal region; increased low back pain with    LUMBAR ROM:    AROM eval  08/14/2022 09/17/2022  Flexion 75% - hamstring tightness 75% - hamstring tightness WFL  Extension WFL    Right lateral flexion WFL    Left lateral flexion WFL    Right rotation WFL    Left rotation WFL     (Blank rows = not tested)   Patient reports midline low back pain with lumbar flexion  LOWER EXTREMITY MMT:     MMT Right eval Left eval Rt / Lt 08/04/22 Rt / Lt 09/17/2022  Hip flexion 4 4-  4 / 4  Hip extension 4 4-  4 / 4  Hip abduction 4 4- 4 / 4- 4 / 4  Hip adduction        Hip internal rotation        Hip external rotation        Knee flexion 5 5    Knee extension 5 5    Ankle dorsiflexion        Ankle plantarflexion        Ankle inversion        Ankle eversion         (Blank rows = not tested)  FUNCTIONAL TESTS:  DLLT: loss of lumbar control at approximately 45 deg, no report of pain  09/17/2022: 30 deg     TODAY'S TREATMENT:   [facility] Adult PT Treatment:                                                DATE: 09/17/22 Therapeutic Exercise: NuStep L7 x 4 min with UE/LE while taking subjective Piriformis stretch push / pull 3 x 20 sec each Supine hamstring stretch with strap 3 x 30 sec each Figure-4 LTR x 5 each Sidelying thoracolumbar rotation x 5 each Cat cow into child's pose x 5 Modified thomas stretch 3 x 20 sec each Pigeon stretch 3 x 20 sec each Bridge x 10 each 90-90 alternating foot taps x 10 Pallof press with FM 10# x 10 Single arm row with FM 10# x 10 each Standing chop with bar FM 10# x 10 each   [facility] Adult PT Treatment:                                                DATE: 09/11/22 Therapeutic Exercise: NuStep L7 x 4 min with UE/LE while taking subjective  Piriformis stretch push / pull 2 x 20 sec each Supine hamstring stretch with strap 2 x 30 sec each Sidelying thoracolumbar rotation x 5 each Figure-4 bridge 2 x 10 each Side plank lift 2 x 10 each Front plank 2 x 3 with 15 sec hold Standing chop with bar FM 10# 2 x 10 each Hex bar deadlift 75# 4  x 6  [facility] Adult PT Treatment:                                                DATE: 09/03/22 Therapeutic Exercise: NuStep L7 x 4 min with UE/LE while taking subjective Piriformis stretch push / pull 2 x 20 sec each LTR x 5 Sidelying thoracolumbar rotation x 5 each Supine hamstring stretch with strap 2 x 30 sec each Figure-4 bridge 2 x 10 each 90-90 alternating leg extension 2 x 10 Pallof press with blue 2 x 10 each Deadlift with 45# 3 x 10   PATIENT EDUCATION:  Education details: POC discharge, HEP Person educated: Patient Education method: Explanation Education comprehension: Verbalized understanding   HOME EXERCISE PROGRAM: Access Code: [email or web address]      ASSESSMENT: CLINICAL IMPRESSION: Patient tolerated therapy well with no adverse effects. He has progressed well with therapy demonstrating improved strength, motion, and reporting improved functional ability. He does continue to report some pain with working but states he can work longer and the pain resolves quicker. He states he is ready for discharge and is independent with his HEP. Patient will be formally discharged from PT this visit.     OBJECTIVE IMPAIRMENTS: decreased activity tolerance, decreased ROM, decreased strength, impaired flexibility, postural dysfunction, and pain.    ACTIVITY LIMITATIONS: carrying, lifting, bending, sitting, standing, and locomotion level   PARTICIPATION LIMITATIONS: community activity and occupation   PERSONAL FACTORS: Fitness, Past/current experiences, Profession, and Time since onset of injury/illness/exacerbation are also affecting patient's functional outcome.      GOALS: Goals reviewed with patient? Yes   SHORT TERM GOALS: Target date: 08/28/2022   Patient will be I with initial HEP in order to progress with therapy. Baseline: HEP provided at eval 08/21/22: compliant and independent Goal status: MET   2.  PT will review FOTO with patient by 3rd visit in order to understand expected  progress and outcome with therapy. Baseline: FOTO assessed at eval Goal status: MET   3.  Patient will report low back pain following work </= 5/10 in order to reduce functional limitations Baseline: 8/10 pain 08/21/22: having less pain , average 5-6/10.     09/17/2022: 5/10 Goal status: MET   LONG TERM GOALS: Target date: 09/25/2022   Patient will be I with final HEP to maintain progress from PT. Baseline: HEP provided at eval Goal status: MET   2.  Patient will report >/= 64% status on FOTO to indicate improved functional ability. Baseline: 52% functional status 09/03/2022: 83% Goal status: MET   3.  Patient will demonstrate left hip strengthening >/= 4/5 MMT and DLLT </= 30 deg in order to indicate improved lumbopelvic control and reduce pain with work related tasks Baseline: hip strength 4-/5 MMT and DLLT to 45 deg 09/17/2022:  hip strength 4/5 MMT and DLLT to 30 deg Goal status: MET   4.  Patient will demonstrate lumbar flexion WFL and non-painful to improve bending and lifting tasks at work Baseline: lumbar  flexion grossly 75% and painful 09/17/2022: WFL Goal status: MET   5.  Patient will report low back pain following work </= 3/10 in order to reduce functional limitations Baseline: 8/10 pain 09/17/2022: 5/10 Goal status: PARTIALLY MET     PLAN: PT FREQUENCY: 1-2x/week   PT DURATION: 8 weeks   PLANNED INTERVENTIONS: Therapeutic exercises, Therapeutic activity, Neuromuscular re-education, Balance training, Gait training, Patient/Family education, Self Care, Joint mobilization, Joint manipulation, Aquatic Therapy, Dry Needling, Spinal manipulation, Spinal mobilization, Manual therapy, and Re-evaluation.   PLAN FOR NEXT SESSION: Review HEP and progress PRN, manual/dry needling for left lumbar and gluteal/piriformis region, hamstring stretching, progress core and hip strengthening   Rosana Hoes, PT, DPT, LAT, ATC 09/17/22  4:13 PM Phone: [phone] Fax:  [phone]   PHYSICAL THERAPY DISCHARGE SUMMARY  Visits from Start of Care: 8  Current functional level related to goals / functional outcomes: See above   Remaining deficits: See above   Education / Equipment: HEP   Patient agrees to discharge. Patient goals were met. Patient is being discharged due to being pleased with the current functional level.

## 2022-09-17 ENCOUNTER — Encounter: Payer: Self-pay | Admitting: Physical Therapy

## 2022-09-17 ENCOUNTER — Other Ambulatory Visit: Payer: Self-pay

## 2022-09-17 ENCOUNTER — Ambulatory Visit: Payer: 59 | Admitting: Physical Therapy

## 2022-09-17 DIAGNOSIS — M6281 Muscle weakness (generalized): Secondary | ICD-10-CM | POA: Diagnosis not present

## 2022-09-17 DIAGNOSIS — M5459 Other low back pain: Secondary | ICD-10-CM

## 2022-09-24 ENCOUNTER — Ambulatory Visit: Payer: 59 | Admitting: Physical Therapy

## 2022-09-26 ENCOUNTER — Other Ambulatory Visit (INDEPENDENT_AMBULATORY_CARE_PROVIDER_SITE_OTHER): Payer: 59

## 2022-09-26 DIAGNOSIS — Z794 Long term (current) use of insulin: Secondary | ICD-10-CM | POA: Diagnosis not present

## 2022-09-26 DIAGNOSIS — E1165 Type 2 diabetes mellitus with hyperglycemia: Secondary | ICD-10-CM

## 2022-09-26 LAB — BASIC METABOLIC PANEL
BUN: 16 mg/dL (ref 6–23)
CO2: 28 mEq/L (ref 19–32)
Calcium: 8.9 mg/dL (ref 8.4–10.5)
Chloride: 101 mEq/L (ref 96–112)
Creatinine, Ser: 0.96 mg/dL (ref 0.40–1.50)
GFR: 90.79 mL/min (ref >60.00)
Glucose, Bld: 203 mg/dL — ABNORMAL HIGH (ref 70–99)
Potassium: 4.7 mEq/L (ref 3.5–5.1)
Sodium: 139 mEq/L (ref 135–145)

## 2022-09-26 LAB — HEMOGLOBIN A1C: Hgb A1c MFr Bld: 8.5 % — ABNORMAL HIGH (ref 4.6–6.5)

## 2022-10-01 ENCOUNTER — Encounter: Payer: Self-pay | Admitting: Endocrinology

## 2022-10-01 ENCOUNTER — Ambulatory Visit: Payer: 59 | Admitting: Endocrinology

## 2022-10-01 VITALS — BP 110/64 | HR 64 | Ht 69.0 in | Wt 174.8 lb

## 2022-10-01 DIAGNOSIS — I10 Essential (primary) hypertension: Secondary | ICD-10-CM | POA: Diagnosis not present

## 2022-10-01 DIAGNOSIS — E1165 Type 2 diabetes mellitus with hyperglycemia: Secondary | ICD-10-CM | POA: Diagnosis not present

## 2022-10-01 DIAGNOSIS — Z794 Long term (current) use of insulin: Secondary | ICD-10-CM | POA: Diagnosis not present

## 2022-10-01 MED ORDER — DEXCOM G6 SENSOR MISC: 3 refills | Status: DC

## 2022-10-01 MED ORDER — OMNIPOD 5 DEXG7G6 INTRO GEN 5 KIT: 1.0000 | PACK | Freq: Once | 0 refills | Status: DC

## 2022-10-01 MED ORDER — OMNIPOD 5 DEXG7G6 PODS GEN 5 MISC: 1.0000 | 3 refills | Status: DC

## 2022-10-01 MED ORDER — DEXCOM G6 TRANSMITTER MISC: 1.0000 | Freq: Once | 1 refills | Status: AC

## 2022-10-01 NOTE — Progress Notes (Signed)
Patient ID: Andrew Hart, male   DOB: 05/22/1970, 52 y.o.   MRN: [id number]           Reason for Appointment: Follow-up for Type 2 Diabetes    History of Present Illness:          Date of diagnosis of type 2 diabetes mellitus: 2007 ?        Background history:  He was diagnosed with routine screening lab from his work He was initially tried on metformin but this caused diarrhea and he could not continue this Subsequently was treated with glipizide and apparently this was able to control his blood sugars fairly well for a few years Probably in early 2016 he was given Actos in addition to help his glucose control as A1c had gone up to 9.4 This helped transiently and A1c was 7.7 However his A1c had been otherwise over 8% since mid 2015 Because of  high blood sugars on his initial consultation in 05/2014 (A1c was 9.1) he was started on Invokana and Onglyza in addition to his Actos and glipizide.   Recent history:   Non-insulin hypoglycemic drugs, Actos 45 mg daily, Jardiance 25 mg daily  Insulin dose: Basaglar 20 units at bedtime, NovoLog 4 to 8 units at lunch  His A1c is 8.5, was 7.4  Lowest A1c on record 6.9  Fructosamine last 315   Current blood sugar patterns and problems identified: He has stopped using insulin about 2 weeks ago He says that he was not wanting to do multiple injections; also at the last visit he was not willing to do the CeQur Simplcity device He also thinks that he wants to try without insulin and wants to get Invokana although this was denied by his insurance; currently getting Jardiance instead of [place] because of insurance preference With this his blood sugars are markedly increased and 94% of the time above the 180 target  Highest blood sugars are generally after lunch when he is likely eating out and also sometimes drinking regular soft drinks With the high blood sugars he is losing some weight and he does not feel good Previously Basaglar was  reduced to 18 units because of occasionally low normal readings overnight Glipizide was stopped on his previous visit       Side effects from medications have been: Diarrhea from regular metformin, nausea and constipation from Rybelsus, nausea from Trulicity, nausea and body aches from [facility]  Interpretation of G7 Dexcom download for the last 2 weeks as follows  Overnight blood sugars are averaging nearly 200 around midnight and then progressively declining to a variable extent by morning with lowest averaging between 6-8 AM down to 124  No hypoglycemia seen except low normal readings overnight POSTPRANDIAL readings sporadically higher after all meals Generally blood sugars are on an average the highest after dinner averaging about 198 around midnight Day-to-day variability is present   CGM use % of time 71  2-week average/GV 265+/-62  Time in range       6%  % Time Above 180 40  % Time above 250 54  % Time Below 70      Data previously:  CGM use % of time   2-week average/GV 158/35  Time in range 67      %  % Time Above 180 25  % Time above 250 7  % Time Below 70 1   Dietician visit, most recent: 06/2015  Weight history:   Wt Readings from Last 3 Encounters:  10/01/22 174 lb 12.8 oz (79.3 kg)  09/16/22 182 lb (82.6 kg)  07/10/22 179 lb (81.2 kg)    Glycemic control:    Lab Results  Component Value Date   HGBA1C 8.5 (H) 09/26/2022   HGBA1C 7.4 (H) 06/30/2022   HGBA1C 7.8 (H) 04/14/2022   Lab Results  Component Value Date   MICROALBUR <0.7 12/23/2021   LDLCALC 84 03/24/2022   CREATININE 0.96 09/26/2022    Lab on 09/26/2022  Component Date Value Ref Range Status   Sodium 09/26/2022 139  135 - 145 mEq/L Final   Potassium 09/26/2022 4.7  3.5 - 5.1 mEq/L Final   Chloride 09/26/2022 101  96 - 112 mEq/L Final   CO2 09/26/2022 28  19 - 32 mEq/L Final   Glucose, Bld 09/26/2022 203 (H)  70 - 99 mg/dL Final   BUN 16/02/9603 16  6 - 23 mg/dL Final   Creatinine,  Ser 09/26/2022 0.96  0.40 - 1.50 mg/dL Final   GFR 54/01/8118 90.79  >60.00 mL/min Final   Calculated using the CKD-EPI Creatinine Equation (2021)   Calcium 09/26/2022 8.9  8.4 - 10.5 mg/dL Final   Hgb J4N MFr Bld 09/26/2022 8.5 (H)  4.6 - 6.5 % Final   Glycemic Control Guidelines for People with Diabetes:Non Diabetic:  <6%Goal of Therapy: <7%Additional Action Suggested:  >8%       Allergies as of 10/01/2022       Reactions   Janumet Xr [sitagliptin-metformin Hcl Er] Rash   Tolerates the plain Januvia    Jardiance [empagliflozin] Rash   Metformin And Related Diarrhea   Methotrexate Derivatives Other (See Comments)   transaminitis   Other Rash   Methyldibromo Gluteronitrile - skin product preservative        Medication List        Accurate as of Oct 01, 2022 10:18 AM. If you have any questions, ask your nurse or doctor.          STOP taking these medications    HYDROcodone-acetaminophen 5-325 MG tablet Commonly known as: NORCO/VICODIN Stopped by: Reather Littler, MD       TAKE these medications    acetaminophen 500 MG tablet Commonly known as: TYLENOL Take 500 mg by mouth every 6 (six) hours as needed.   acyclovir 200 MG capsule Commonly known as: ZOVIRAX TAKE 1 CAPSULE BY MOUTH 3 TIMES DAILY FOR 3 TO 5 DAYS AS NEEDED.   aspirin EC 81 MG tablet Take 1 tablet (81 mg total) by mouth every Monday, Wednesday, and Friday.   B-12 1000 MCG Subl Place 1 tablet under the tongue daily.   Basaglar KwikPen 100 UNIT/ML START WITH 6 UNITS, ADJUST UP TO 20 UNITS AT NIGHT DAILY AS DIRECTED   Bayer Microlet Lancets lancets Use as instructed to check blood sugar twice a day dx E11.65   busPIRone 15 MG tablet Commonly known as: BUSPAR Take 1 tablet (15 mg total) by mouth 2 (two) times daily.   cholecalciferol 25 MCG (1000 UNIT) tablet Commonly known as: VITAMIN D3 Take 1,000 Units by mouth daily.   citalopram 20 MG tablet Commonly known as: CELEXA Take 1 tablet (20  mg total) by mouth daily.   Contour Next Monitor w/Device Kit CHECK BLOOD SUGAR TWICE DAILY   cyclobenzaprine 5 MG tablet Commonly known as: FLEXERIL Take 1 tablet (5 mg total) by mouth 2 (two) times daily as needed for muscle spasms.   Dexcom G7 Sensor Misc CHANGE EVERY 10 DAYS   diclofenac Sodium  1 % Gel Commonly known as: Voltaren Arthritis Pain Apply 2 g topically 4 (four) times daily.   dupilumab 300 MG/2ML prefilled syringe Commonly known as: DUPIXENT Inject 300 mg into the skin every 14 (fourteen) days.   empagliflozin 25 MG Tabs tablet Commonly known as: Jardiance Take 1 tablet (25 mg total) by mouth daily before breakfast.   halobetasol 0.05 % cream Commonly known as: ULTRAVATE Apply 1 application topically 2 (two) times daily. Can cover with Saran wrap fo r30 min.  DO NOT apply to face/groin.   hydrOXYzine 25 MG tablet Commonly known as: ATARAX Take 25 mg by mouth 3 (three) times daily as needed for itching.   ibuprofen 200 MG tablet Commonly known as: ADVIL Take 400 mg by mouth every 6 (six) hours as needed for headache or moderate pain.   losartan 50 MG tablet Commonly known as: COZAAR Take 1 tablet (50 mg total) by mouth daily.   NovoLOG FlexPen 100 UNIT/ML FlexPen Generic drug: insulin aspart Inject 12 Units into the skin 3 (three) times daily with meals.   OneTouch Verio test strip Generic drug: glucose blood Use as instructed   pantoprazole 40 MG tablet Commonly known as: PROTONIX Take 1 tablet (40 mg total) by mouth daily.   Pen Needles 32G X 4 MM Misc Use to inject insulin 2x daily into skin   pioglitazone 45 MG tablet Commonly known as: ACTOS Take 1 tablet (45 mg total) by mouth daily.   simvastatin 80 MG tablet Commonly known as: ZOCOR Take 1 tablet (80 mg total) by mouth daily.   traZODone 150 MG tablet Commonly known as: DESYREL TAKE 1/2 TABLET (75 MG TOTAL) BY MOUTH AT BEDTIME.        Allergies:  Allergies  Allergen  Reactions   Janumet Xr [Sitagliptin-Metformin Hcl Er] Rash    Tolerates the plain Januvia    Jardiance [Empagliflozin] Rash   Metformin And Related Diarrhea   Methotrexate Derivatives Other (See Comments)    transaminitis   Other Rash    Methyldibromo Gluteronitrile - skin product preservative    Past Medical History:  Diagnosis Date   Alcohol use 11/08/2015   Anxiety    Aortic aneurysm (HCC)    Diabetes mellitus without complication (HCC) 2006   Danyal Adorno   GERD (gastroesophageal reflux disease)    Herpes labialis    History of chicken pox    Hyperlipidemia    Kidney stone     Past Surgical History:  Procedure Laterality Date   ABDOMINAL AORTOGRAM W/LOWER EXTREMITY N/A 07/22/2019   Procedure: ABDOMINAL AORTOGRAM W/LOWER EXTREMITY;  Surgeon: Sherren Kerns, MD;  Location: [facility];  Service: Cardiovascular;  Laterality: N/A;   APPENDECTOMY     FINGER SURGERY Right    2 screws in ring finger   PERIPHERAL VASCULAR INTERVENTION Left 07/22/2019   Procedure: PERIPHERAL VASCULAR INTERVENTION;  Surgeon: Sherren Kerns, MD;  Location: [facility];  Service: Cardiovascular;  Laterality: Left;   TONSILLECTOMY      Family History  Problem Relation Age of Onset   Diabetes Mother    Parkinsonism Mother 63       progressive supranuclear palsy, deceased   CAD Father 35       MI at 61, CABG at 44   Diabetes Maternal Grandmother    Stroke Neg Hx    Cancer Neg Hx    Colon cancer Neg Hx    Esophageal cancer Neg Hx    Rectal cancer Neg  Hx    Stomach cancer Neg Hx     Social History:  reports that he quit smoking about 3 years ago. His smoking use included cigarettes. He has a 7.50 pack-year smoking history. He has never been exposed to tobacco smoke. He has never used smokeless tobacco. He reports that he does not currently use alcohol. He reports that he does not currently use drugs.    Review of Systems    Lipid history:  Has been on 80 mg simvastatin for  treatment, prescribed by PCP  His lipids are generally well controlled    Lab Results  Component Value Date   CHOL 148 03/24/2022   HDL 48.00 03/24/2022   LDLCALC 84 03/24/2022   LDLDIRECT 79.0 04/14/2022   TRIG 80.0 03/24/2022   CHOLHDL 3 03/24/2022         Lab Results  Component Value Date   ALT 17 12/23/2021    He has been refusing to take the Covid vaccine   History of atopic dermatitis  Blood pressure is consistently normal, on 50 mg losartan prescribed by PCP   BP Readings from Last 3 Encounters:  10/01/22 110/64  09/16/22 132/89  07/10/22 123/77     Review of Systems  Most recent eye exam: 09/2021  Most recent foot exam: 7/21  No numbness or tingling in his feet    Physical Examination:  BP 110/64   Pulse 64   Ht 5\' 9"  (1.753 m)   Wt 174 lb 12.8 oz (79.3 kg)   SpO2 97%   BMI 25.81 kg/m    ASSESSMENT:  Diabetes type 2, insulin requiring  See history of present illness for detailed discussion of current diabetes management, blood sugar patterns and problems identified  His A1c is 8.5  He is currently taking only Jardiance and Actos   Although he was doing very well with basal bolus insulin regimen on the last visit he has stopped taking insulin and his blood sugars are significantly higher recently averaging 265 He is not willing to consider the fact that he is insulin deficient and has had progression of the diabetes  Discussed importance of treating insulin deficiency and that he will not be able to good control without some form of insulin Although he was previously rejecting the idea of an insulin infusion device he is now open to the idea of the OmniPod pump which will be more flexible and able to regulate blood sugars in between meals with the closed-loop system.   The V-go system was also demonstrated to him but he is not too keen on this and this may be providing excessive basal  HYPERTENSION: His blood pressure is  well-controlled  Renal function normal again  PLAN:    He will restart at least Basaglar and NovoLog at lunch until he starts the pump Will start him on the insulin pump with the OmniPod system Discussed the details of how this works and given him brochure to look up also Will also check insurance coverage for this and sent prescription to the drugstore for this  Continue Actos as long as he has no weight gain for insulin sensitivity and this was explained He will be set up for training for the pump and also switch to the Dexcom G6 to combine with the OmniPod pump   There are no Patient Instructions on file for this visit.  Total visit time including counseling = 30 minutes    Reather Littler 10/01/2022, 10:18 AM   Note: This  office note was prepared with Insurance underwriter. Any transcriptional errors that result from this process are unintentional.

## 2022-10-01 NOTE — Patient Instructions (Signed)
Resume insulin

## 2022-10-02 ENCOUNTER — Other Ambulatory Visit: Payer: Self-pay | Admitting: Endocrinology

## 2022-10-02 DIAGNOSIS — E1165 Type 2 diabetes mellitus with hyperglycemia: Secondary | ICD-10-CM

## 2022-10-03 ENCOUNTER — Telehealth: Payer: Self-pay | Admitting: Dietician

## 2022-10-03 DIAGNOSIS — Z794 Long term (current) use of insulin: Secondary | ICD-10-CM

## 2022-10-03 MED ORDER — INSULIN ASPART 100 UNIT/ML IJ SOLN: INTRAMUSCULAR | 1 refills | Status: DC

## 2022-10-03 NOTE — Telephone Encounter (Signed)
Returned patient call.  He has 2 appointments with Bonita Quin for pump training and CGM training next week.  He states that he has his pump but does not have his CGM yet and also does not have Novolog in a vial.  Requested Novolog in a vial to be sent to his pharmacy and obtained a vial for his use:  Lot [id number], expiration 07/03/2023. We have a Dexcom G6 as well that patient can be trained with so that he can start his pump.    Called patient and informed him of above. He will see Bonita Quin Monday.  Oran Rein, RD, LDN, CDCES

## 2022-10-03 NOTE — Telephone Encounter (Signed)
Rx sent 

## 2022-10-06 ENCOUNTER — Other Ambulatory Visit: Payer: Self-pay

## 2022-10-06 ENCOUNTER — Encounter: Payer: 59 | Attending: Endocrinology | Admitting: Nutrition

## 2022-10-06 DIAGNOSIS — E1169 Type 2 diabetes mellitus with other specified complication: Secondary | ICD-10-CM | POA: Insufficient documentation

## 2022-10-06 DIAGNOSIS — E1165 Type 2 diabetes mellitus with hyperglycemia: Secondary | ICD-10-CM

## 2022-10-06 MED ORDER — OMNIPOD 5 DEXG7G6 INTRO GEN 5 KIT: PACK | 0 refills | Status: DC

## 2022-10-06 NOTE — Patient Instructions (Signed)
Change sensor every 10 days. Change transmitter every 3 months Call Dexcom if questions or problems with sensors Read over manual.

## 2022-10-06 NOTE — Progress Notes (Signed)
Patient was trained on the use of the G6 sensors and transmitter.  He set up his app on his phone and this was linked to [facility] and to [facility] [place] [facility].  He did not have PDM ordered.  This script was sent to [facility] with Novolog insulin in a vial.  He was instructed to charge his PDM and set up an OmniPod account and let me know what the user name and password is.  He agreed to do this and come in tomorrow to start the pump.

## 2022-10-07 ENCOUNTER — Ambulatory Visit: Payer: 59 | Admitting: Nutrition

## 2022-10-09 ENCOUNTER — Encounter: Payer: Self-pay | Admitting: Endocrinology

## 2022-10-13 ENCOUNTER — Encounter: Payer: 59 | Admitting: Nutrition

## 2022-10-13 DIAGNOSIS — E1169 Type 2 diabetes mellitus with other specified complication: Secondary | ICD-10-CM

## 2022-10-14 ENCOUNTER — Telehealth: Payer: Self-pay | Admitting: Nutrition

## 2022-10-14 DIAGNOSIS — M9903 Segmental and somatic dysfunction of lumbar region: Secondary | ICD-10-CM | POA: Diagnosis not present

## 2022-10-14 DIAGNOSIS — M9901 Segmental and somatic dysfunction of cervical region: Secondary | ICD-10-CM | POA: Diagnosis not present

## 2022-10-14 DIAGNOSIS — M9904 Segmental and somatic dysfunction of sacral region: Secondary | ICD-10-CM | POA: Diagnosis not present

## 2022-10-14 DIAGNOSIS — M9902 Segmental and somatic dysfunction of thoracic region: Secondary | ICD-10-CM | POA: Diagnosis not present

## 2022-10-14 NOTE — Telephone Encounter (Signed)
Patient reported no difficulty boluses, wearing the pump or blood sugar difficulties.  Reports FBS today was 108.  Had question about how to do a correction dose.  We reviewed this and he reported good understanding.

## 2022-10-14 NOTE — Progress Notes (Signed)
Patient was trained on the use of the OmniPod5 insulin pump.  Settings were put in by the patient per Dr. Remus Blake orders.  Basal rate: 0.7u/hr, I/C: 1 with directions to take 6u ac B and 14u acS.,  ISF: 50, Target: 110 with correction over 120.  Max bolus 15, Max basal rate 2.0u/hr.  His omnipod data was linked to [facility].  Log in:  [facility]   PW: [email or web address]$ He filled a pod with Aspart insulin because he forgot his Novolog.  Discussed need to bolus right before eating for this, as wait time is less than 5 minutes.  He reported good understanding of this.   His Dexcom was liked to his PDM, and he was shown how to give a bolus, and when and how to do correction boluses.  He re demonstrated this X 2 correctly.  We discussed alerts and alarms and high blood sugar protocol.  He was given a pump booklet and he was encouraged to read the booklet as well as the pump starter booklet.   We reviewed the checklist and he signed this, indicating good understanding of all topics.  He had no final questions.

## 2022-10-14 NOTE — Patient Instructions (Signed)
Change Dexcom G6 sensor every 10 days Change pod every 3 days Call Omnipod help line if questions Call Dexcom if questions or problems with sensors Call office if blood sugars drop low or remain over 250.

## 2022-10-15 ENCOUNTER — Ambulatory Visit: Payer: 59 | Admitting: Endocrinology

## 2022-10-15 ENCOUNTER — Ambulatory Visit (INDEPENDENT_AMBULATORY_CARE_PROVIDER_SITE_OTHER): Payer: 59 | Admitting: Endocrinology

## 2022-10-15 VITALS — BP 138/79 | HR 56 | Ht 67.0 in | Wt 179.2 lb

## 2022-10-15 DIAGNOSIS — Z794 Long term (current) use of insulin: Secondary | ICD-10-CM

## 2022-10-15 DIAGNOSIS — E1165 Type 2 diabetes mellitus with hyperglycemia: Secondary | ICD-10-CM

## 2022-10-15 NOTE — Progress Notes (Signed)
Patient ID: Andrew Hart, male   DOB: 1970-08-10, 52 y.o.   MRN: [id number]           Reason for Appointment: Follow-up for Type 2 Diabetes    History of Present Illness:          Date of diagnosis of type 2 diabetes mellitus: 2007 ?        Background history:  He was diagnosed with routine screening lab from his work He was initially tried on metformin but this caused diarrhea and he could not continue this Subsequently was treated with glipizide and apparently this was able to control his blood sugars fairly well for a few years Probably in early 2016 he was given Actos in addition to help his glucose control as A1c had gone up to 9.4 This helped transiently and A1c was 7.7 However his A1c had been otherwise over 8% since mid 2015 Because of  high blood sugars on his initial consultation in 05/2014 (A1c was 9.1) he was started on Invokana and Onglyza in addition to his Actos and glipizide.   Insulin dose previously: Basaglar 20 units at bedtime, NovoLog 4 to 8 units at lunch  Recent history:   Non-insulin hypoglycemic drugs, Actos 45 mg daily, Jardiance 25 mg daily  OMNIPOD 5 pump settings as follows  Basal rate: 0.7u/hr, I/C 1: 1 with directions to take 6u ac B and 14u acS., ISF: 50, Target: 110 with correction over 120   His A1c is 8.5  Lowest A1c on record 6.9  Fructosamine last 315   Current blood sugar patterns and problems identified: He has started using insulin pump with the OmniPod 5 on 10/13/2018 4 in the afternoon She was to take 6 units for small meals and 14 for larger meals as boluses Has been able to continue the automated mode since the start In the last 2 days he has had 2 relatively high fat meals at lunch, once with correct patient and another with pizza and with this his blood sugars were well over 200 and continued to be high for several hours Also with one of the meals he did not bolus till he got home after eating Last evening with only grilled  chicken and some carbohydrate a had blood sugars only about 110 postprandially with taking 6 units bolus Because of fear of hypoglycemia he had a snack late last night which caused his blood sugar to be over 200 for a few hours Otherwise overnight blood sugar on 6/11 was consistently on target  Recent glucose data shows 67% within target range No hypoglycemia       Side effects from medications have been: Diarrhea from regular metformin, nausea and constipation from Rybelsus, nausea from Trulicity, nausea and body aches from [facility]  Previous Dexcom data:  CGM use % of time 71  2-week average/GV 265+/-62  Time in range       6%  % Time Above 180 40  % Time above 250 54  % Time Below 70      Dietician visit, most recent: 06/2015  Weight history:   Wt Readings from Last 3 Encounters:  10/15/22 179 lb 3.2 oz (81.3 kg)  10/01/22 174 lb 12.8 oz (79.3 kg)  09/16/22 182 lb (82.6 kg)    Glycemic control:    Lab Results  Component Value Date   HGBA1C 8.5 (H) 09/26/2022   HGBA1C 7.4 (H) 06/30/2022   HGBA1C 7.8 (H) 04/14/2022   Lab Results  Component Value  Date   MICROALBUR <0.7 12/23/2021   LDLCALC 84 03/24/2022   CREATININE 0.96 09/26/2022    No visits with results within 1 Week(s) from this visit.  Latest known visit with results is:  Lab on 09/26/2022  Component Date Value Ref Range Status   Sodium 09/26/2022 139  135 - 145 mEq/L Final   Potassium 09/26/2022 4.7  3.5 - 5.1 mEq/L Final   Chloride 09/26/2022 101  96 - 112 mEq/L Final   CO2 09/26/2022 28  19 - 32 mEq/L Final   Glucose, Bld 09/26/2022 203 (H)  70 - 99 mg/dL Final   BUN 16/02/9603 16  6 - 23 mg/dL Final   Creatinine, Ser 09/26/2022 0.96  0.40 - 1.50 mg/dL Final   GFR 54/01/8118 90.79  >60.00 mL/min Final   Calculated using the CKD-EPI Creatinine Equation (2021)   Calcium 09/26/2022 8.9  8.4 - 10.5 mg/dL Final   Hgb J4N MFr Bld 09/26/2022 8.5 (H)  4.6 - 6.5 % Final   Glycemic Control Guidelines for  People with Diabetes:Non Diabetic:  <6%Goal of Therapy: <7%Additional Action Suggested:  >8%       Allergies as of 10/15/2022       Reactions   Janumet Xr [sitagliptin-metformin Hcl Er] Rash   Tolerates the plain Januvia    Jardiance [empagliflozin] Rash   Metformin And Related Diarrhea   Methotrexate Derivatives Other (See Comments)   transaminitis   Other Rash   Methyldibromo Gluteronitrile - skin product preservative        Medication List        Accurate as of October 15, 2022 11:55 AM. If you have any questions, ask your nurse or doctor.          acetaminophen 500 MG tablet Commonly known as: TYLENOL Take 500 mg by mouth every 6 (six) hours as needed.   acyclovir 200 MG capsule Commonly known as: ZOVIRAX TAKE 1 CAPSULE BY MOUTH 3 TIMES DAILY FOR 3 TO 5 DAYS AS NEEDED.   aspirin EC 81 MG tablet Take 1 tablet (81 mg total) by mouth every Monday, Wednesday, and Friday.   B-12 1000 MCG Subl Place 1 tablet under the tongue daily.   Basaglar KwikPen 100 UNIT/ML START WITH 6 UNITS, ADJUST UP TO 20 UNITS AT NIGHT DAILY AS DIRECTED   Bayer Microlet Lancets lancets Use as instructed to check blood sugar twice a day dx E11.65   busPIRone 15 MG tablet Commonly known as: BUSPAR Take 1 tablet (15 mg total) by mouth 2 (two) times daily.   cholecalciferol 25 MCG (1000 UNIT) tablet Commonly known as: VITAMIN D3 Take 1,000 Units by mouth daily.   citalopram 20 MG tablet Commonly known as: CELEXA Take 1 tablet (20 mg total) by mouth daily.   Contour Next Monitor w/Device Kit CHECK BLOOD SUGAR TWICE DAILY   cyclobenzaprine 5 MG tablet Commonly known as: FLEXERIL Take 1 tablet (5 mg total) by mouth 2 (two) times daily as needed for muscle spasms.   Dexcom G7 Sensor Misc CHANGE EVERY 10 DAYS   Dexcom G6 Sensor Misc Use to monitor blood sugar, change after 10 days   diclofenac Sodium 1 % Gel Commonly known as: Voltaren Arthritis Pain Apply 2 g topically 4 (four)  times daily.   dupilumab 300 MG/2ML prefilled syringe Commonly known as: DUPIXENT Inject 300 mg into the skin every 14 (fourteen) days.   empagliflozin 25 MG Tabs tablet Commonly known as: Jardiance Take 1 tablet (25 mg total) by mouth  daily before breakfast.   halobetasol 0.05 % cream Commonly known as: ULTRAVATE Apply 1 application topically 2 (two) times daily. Can cover with Saran wrap fo r30 min.  DO NOT apply to face/groin.   hydrOXYzine 25 MG tablet Commonly known as: ATARAX Take 25 mg by mouth 3 (three) times daily as needed for itching.   ibuprofen 200 MG tablet Commonly known as: ADVIL Take 400 mg by mouth every 6 (six) hours as needed for headache or moderate pain.   losartan 50 MG tablet Commonly known as: COZAAR Take 1 tablet (50 mg total) by mouth daily.   NovoLOG FlexPen 100 UNIT/ML FlexPen Generic drug: insulin aspart Inject 12 Units into the skin 3 (three) times daily with meals.   insulin aspart 100 UNIT/ML injection Commonly known as: NovoLOG Use for Insulin Pump (56 units)   Omnipod 5 G6 Pods (Gen 5) Misc 1 Device by Does not apply route every 3 (three) days.   Omnipod 5 G6 Intro (Gen 5) Kit Use as directed.   OneTouch Verio test strip Generic drug: glucose blood Use as instructed   pantoprazole 40 MG tablet Commonly known as: PROTONIX Take 1 tablet (40 mg total) by mouth daily.   Pen Needles 32G X 4 MM Misc Use to inject insulin 2x daily into skin   pioglitazone 45 MG tablet Commonly known as: ACTOS Take 1 tablet (45 mg total) by mouth daily.   simvastatin 80 MG tablet Commonly known as: ZOCOR Take 1 tablet (80 mg total) by mouth daily.   traZODone 150 MG tablet Commonly known as: DESYREL TAKE 1/2 TABLET (75 MG TOTAL) BY MOUTH AT BEDTIME.        Allergies:  Allergies  Allergen Reactions   Janumet Xr [Sitagliptin-Metformin Hcl Er] Rash    Tolerates the plain Januvia    Jardiance [Empagliflozin] Rash   Metformin And Related  Diarrhea   Methotrexate Derivatives Other (See Comments)    transaminitis   Other Rash    Methyldibromo Gluteronitrile - skin product preservative    Past Medical History:  Diagnosis Date   Alcohol use 11/08/2015   Anxiety    Aortic aneurysm (HCC)    Diabetes mellitus without complication (HCC) 2006   Lemya Greenwell   GERD (gastroesophageal reflux disease)    Herpes labialis    History of chicken pox    Hyperlipidemia    Kidney stone     Past Surgical History:  Procedure Laterality Date   ABDOMINAL AORTOGRAM W/LOWER EXTREMITY N/A 07/22/2019   Procedure: ABDOMINAL AORTOGRAM W/LOWER EXTREMITY;  Surgeon: Sherren Kerns, MD;  Location: [facility];  Service: Cardiovascular;  Laterality: N/A;   APPENDECTOMY     FINGER SURGERY Right    2 screws in ring finger   PERIPHERAL VASCULAR INTERVENTION Left 07/22/2019   Procedure: PERIPHERAL VASCULAR INTERVENTION;  Surgeon: Sherren Kerns, MD;  Location: [facility];  Service: Cardiovascular;  Laterality: Left;   TONSILLECTOMY      Family History  Problem Relation Age of Onset   Diabetes Mother    Parkinsonism Mother 38       progressive supranuclear palsy, deceased   CAD Father 1       MI at 80, CABG at 62   Diabetes Maternal Grandmother    Stroke Neg Hx    Cancer Neg Hx    Colon cancer Neg Hx    Esophageal cancer Neg Hx    Rectal cancer Neg Hx    Stomach cancer Neg Hx  Social History:  reports that he quit smoking about 3 years ago. His smoking use included cigarettes. He has a 7.50 pack-year smoking history. He has never been exposed to tobacco smoke. He has never used smokeless tobacco. He reports that he does not currently use alcohol. He reports that he does not currently use drugs.    Review of Systems    Lipid history:  Has been on 80 mg simvastatin for treatment, prescribed by PCP  His lipids are generally well controlled    Lab Results  Component Value Date   CHOL 148 03/24/2022   HDL 48.00  03/24/2022   LDLCALC 84 03/24/2022   LDLDIRECT 79.0 04/14/2022   TRIG 80.0 03/24/2022   CHOLHDL 3 03/24/2022         Lab Results  Component Value Date   ALT 17 12/23/2021    He has been refusing to take the Covid vaccine   History of atopic dermatitis  Blood pressure is consistently normal, on 50 mg losartan prescribed by PCP   BP Readings from Last 3 Encounters:  10/15/22 138/79  10/01/22 110/64  09/16/22 132/89     Review of Systems  Most recent eye exam: 09/2021  Most recent foot exam: 7/21  No numbness or tingling in his feet    Physical Examination:  BP 138/79 (BP Location: Left Arm, Patient Position: Sitting, Cuff Size: Normal)   Pulse (!) 56   Ht 5\' 7"  (1.702 m)   Wt 179 lb 3.2 oz (81.3 kg)   SpO2 94%   BMI 28.07 kg/m    ASSESSMENT:  Diabetes type 2, insulin requiring  See history of present illness for detailed discussion of current diabetes management, blood sugar patterns and problems identified  His A1c is 8.5  He is currently taking only Jardiance and Actos   As above he is now on the closed-loop OmniPod 5 insulin pump with no difficulties using the pump for the last 3 days He has still to learn how to bolus for different kind of meals and this is discussed in detail above Otherwise his pump appears to be keeping his blood sugars in the excellent range at the 110 target when not eating especially avoiding high-fat meals  PLAN:    Adjust boluses based on meal size Generally for higher fat meals, pizza and sweets are very large amount of carbohydrate may need to take up to 14 units and try to take before meals Keep PDM with him all the time when eating out  Take correction bolus if blood sugars are unexpectedly high or continue to stay high about 2 hours after meals Take 12-14 units for relatively high fat meals Take 2 to 4 units extra for any snacks in between meals or at night Do not need to have an extra snack at bedtime as blood sugar  will not drop below normal from the pump  Stay on Actos and Jardiance  Patient Instructions  Take correction bolus if blood sugars are unexpectedly high or continue to stay high about 2 hours after meals Take 12-14 units for relatively high fat meals Take 2 to 4 units extra for any snacks in between meals or at night  Stay on Actos and Jardiance  Total visit time including counseling = 30 minutes    Reather Littler 10/15/2022, 11:55 AM   Note: This office note was prepared with Dragon voice recognition system technology. Any transcriptional errors that result from this process are unintentional.

## 2022-10-15 NOTE — Patient Instructions (Signed)
Take correction bolus if blood sugars are unexpectedly high or continue to stay high about 2 hours after meals Take 12-14 units for relatively high fat meals Take 2 to 4 units extra for any snacks in between meals or at night  Stay on Actos and Jardiance

## 2022-10-23 ENCOUNTER — Encounter (HOSPITAL_COMMUNITY): Payer: 59

## 2022-10-23 ENCOUNTER — Ambulatory Visit: Payer: 59

## 2022-10-23 ENCOUNTER — Other Ambulatory Visit (HOSPITAL_COMMUNITY): Payer: 59

## 2022-11-03 DIAGNOSIS — M9904 Segmental and somatic dysfunction of sacral region: Secondary | ICD-10-CM | POA: Diagnosis not present

## 2022-11-03 DIAGNOSIS — M9902 Segmental and somatic dysfunction of thoracic region: Secondary | ICD-10-CM | POA: Diagnosis not present

## 2022-11-03 DIAGNOSIS — M9903 Segmental and somatic dysfunction of lumbar region: Secondary | ICD-10-CM | POA: Diagnosis not present

## 2022-11-03 DIAGNOSIS — M9901 Segmental and somatic dysfunction of cervical region: Secondary | ICD-10-CM | POA: Diagnosis not present

## 2022-11-04 ENCOUNTER — Other Ambulatory Visit: Payer: Self-pay | Admitting: *Deleted

## 2022-11-04 DIAGNOSIS — I739 Peripheral vascular disease, unspecified: Secondary | ICD-10-CM

## 2022-11-04 DIAGNOSIS — I700 Atherosclerosis of aorta: Secondary | ICD-10-CM

## 2022-11-04 DIAGNOSIS — I714 Abdominal aortic aneurysm, without rupture, unspecified: Secondary | ICD-10-CM

## 2022-12-01 ENCOUNTER — Ambulatory Visit (HOSPITAL_COMMUNITY)
Admission: RE | Admit: 2022-12-01 | Discharge: 2022-12-01 | Disposition: A | Discharge status: Home / Self Care | Payer: 59 | Source: Ambulatory Visit | Attending: Surgery | Admitting: Surgery

## 2022-12-01 ENCOUNTER — Encounter: Payer: Self-pay | Admitting: Physician Assistant

## 2022-12-01 ENCOUNTER — Ambulatory Visit: Payer: 59 | Admitting: Physician Assistant

## 2022-12-01 ENCOUNTER — Ambulatory Visit (INDEPENDENT_AMBULATORY_CARE_PROVIDER_SITE_OTHER)
Admission: RE | Admit: 2022-12-01 | Discharge: 2022-12-01 | Disposition: A | Discharge status: Home / Self Care | Payer: 59 | Source: Ambulatory Visit | Attending: Surgery | Admitting: Surgery

## 2022-12-01 VITALS — BP 137/78 | HR 47 | Temp 97.1°F | Resp 18 | Ht 67.0 in | Wt 181.0 lb

## 2022-12-01 DIAGNOSIS — I708 Atherosclerosis of other arteries: Secondary | ICD-10-CM

## 2022-12-01 DIAGNOSIS — I700 Atherosclerosis of aorta: Secondary | ICD-10-CM

## 2022-12-01 DIAGNOSIS — I739 Peripheral vascular disease, unspecified: Secondary | ICD-10-CM | POA: Insufficient documentation

## 2022-12-01 DIAGNOSIS — I714 Abdominal aortic aneurysm, without rupture, unspecified: Secondary | ICD-10-CM | POA: Diagnosis not present

## 2022-12-01 LAB — VAS US ABI WITH/WO TBI
Left ABI: 1.03
Right ABI: 0.88
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
## 2022-12-05 ENCOUNTER — Other Ambulatory Visit: Payer: Self-pay

## 2022-12-05 DIAGNOSIS — I739 Peripheral vascular disease, unspecified: Secondary | ICD-10-CM

## 2022-12-05 DIAGNOSIS — I714 Abdominal aortic aneurysm, without rupture, unspecified: Secondary | ICD-10-CM

## 2022-12-05 DIAGNOSIS — I700 Atherosclerosis of aorta: Secondary | ICD-10-CM

## 2022-12-18 ENCOUNTER — Encounter: Payer: 59 | Attending: Physical Medicine & Rehabilitation | Admitting: Physical Medicine & Rehabilitation

## 2022-12-18 ENCOUNTER — Encounter: Payer: Self-pay | Admitting: Physical Medicine & Rehabilitation

## 2022-12-18 VITALS — BP 114/72 | HR 56 | Ht 67.0 in | Wt 183.0 lb

## 2022-12-18 DIAGNOSIS — F411 Generalized anxiety disorder: Secondary | ICD-10-CM | POA: Insufficient documentation

## 2022-12-18 DIAGNOSIS — M533 Sacrococcygeal disorders, not elsewhere classified: Secondary | ICD-10-CM | POA: Insufficient documentation

## 2022-12-18 DIAGNOSIS — G8929 Other chronic pain: Secondary | ICD-10-CM | POA: Insufficient documentation
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
## 2022-12-18 NOTE — Progress Notes (Signed)
Subjective:    Patient ID: Andrew Hart, male    DOB: [birth year], 52 y.o.   MRN: [id number]  HPI   HPI Andrew Hart is a 52 y.o. year old male  who  has a past medical history of Alcohol use (11/08/2015), Anxiety, Aortic aneurysm (HCC), Diabetes mellitus without complication (HCC) (2006), GERD (gastroesophageal reflux disease), Herpes labialis, History of chicken pox, Hyperlipidemia, and Kidney stone.   They are presenting to PM&R clinic as a new patient for pain management evaluation. They were referred  for treatment of left SI joint chronic pain.  He reports that he developed pain in his back about a year and a half ago.  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  Pain is worsened by activities and improved with rest.  Heat also helps this pain.  He was seen by his PCP who noted that he had pain at his left SI joint.  He sees a Land.  Occasionally the pain will shoot across his left anterior hip.   Red flag symptoms: Patient denies saddle anesthesia, loss of bowel or bladder continence, new weakness, new numbness/tingling, or pain waking up at nighttime.   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  His SI joint injection was declined by insurance.  Patient admits he had some reservation about having an injection in his back, although he was planning to try this had not been approved.  He feels that his pain is little better since he replaced his prior bed with an adjustable bed frame.  He has minimal improvement with Robaxin.  He does not take NSAIDs due to GI upset.        Interval History 09/16/22 Andrew Hart reports his pain is improved with PT. He has one more visit scheduled. He has been taught home exercises.  Reports pain now controlled. He sometimes uses flexeril with benefit. Voltaren gel also beneficial.   Interval History 12/18/22 Andrew Hart is here for follow-up regarding his back pain.  Back pain continues to be well-controlled.  He is not requiring frequent medications for pain control.  He is using Voltaren gel more for his elbow than his back at this point.  No new concerns.  Pain Inventory Average Pain 3 Pain Right Now 1 My pain is intermittent and dull  In the last 24 hours, has pain interfered with the following? General activity 3 Relation with others 1 Enjoyment of life 3 What TIME of day is your pain at its worst? evening Sleep (in general) Fair  Pain is worse with: bending Pain improves with: rest Relief from Meds: 5  Family History  Problem Relation Age of Onset   Diabetes Mother    Parkinsonism Mother 42       progressive supranuclear palsy, deceased   CAD Father 55       MI at 10, CABG at 70   Diabetes Maternal Grandmother    Stroke Neg Hx    Cancer Neg Hx    Colon cancer Neg Hx    Esophageal cancer Neg Hx    Rectal  cancer Neg Hx    Stomach cancer Neg Hx    Social History   Socioeconomic History   Marital status: Married    Spouse name: Not on file   Number of children: Not on file   Years of education: Not on file   Highest education level: Not on file  Occupational History   Not on file  Tobacco Use   Smoking status: Former    Current packs/day: 0.00    Average packs/day: 0.5 packs/day for 15.0 years (7.5 ttl pk-yrs)    Types: Cigarettes    Start date: 02/03/2004    Quit date: 02/03/2019    Years since quitting: 3.8    Passive exposure: Never   Smokeless tobacco: Never  Vaping Use   Vaping status: Never Used  Substance and Sexual Activity   Alcohol use: Not Currently    Alcohol/week: 0.0 standard  drinks of alcohol    Comment: Regular beer (12 pk/wkend)   Drug use: Not Currently    Comment: MJ (occasional)   Sexual activity: Not on file  Other Topics Concern   Not on file  Social History Narrative   Lives with wife and 2 children (daughter 2003, son 61)    Occ: owns Education officer, environmental business    Activity: active at work, at home on yard   Diet: some water, fruits/vegetables daily    Social Determinants of Health   Financial Resource Strain: Not on file  Food Insecurity: Not on file  Transportation Needs: Not on file  Physical Activity: Not on file  Stress: Not on file  Social Connections: Not on file   Past Surgical History:  Procedure Laterality Date   ABDOMINAL AORTOGRAM W/LOWER EXTREMITY N/A 07/22/2019   Procedure: ABDOMINAL AORTOGRAM W/LOWER EXTREMITY;  Surgeon: Sherren Kerns, MD;  Location: [facility];  Service: Cardiovascular;  Laterality: N/A;   APPENDECTOMY     FINGER SURGERY Right    2 screws in ring finger   PERIPHERAL VASCULAR INTERVENTION Left 07/22/2019   Procedure: PERIPHERAL VASCULAR INTERVENTION;  Surgeon: Sherren Kerns, MD;  Location: [facility];  Service: Cardiovascular;  Laterality: Left;   TONSILLECTOMY     Past Surgical History:  Procedure Laterality Date   ABDOMINAL AORTOGRAM W/LOWER EXTREMITY N/A 07/22/2019   Procedure: ABDOMINAL AORTOGRAM W/LOWER EXTREMITY;  Surgeon: Sherren Kerns, MD;  Location: [facility];  Service: Cardiovascular;  Laterality: N/A;   APPENDECTOMY     FINGER SURGERY Right    2 screws in ring finger   PERIPHERAL VASCULAR INTERVENTION Left 07/22/2019   Procedure: PERIPHERAL VASCULAR INTERVENTION;  Surgeon: Sherren Kerns, MD;  Location: [facility];  Service: Cardiovascular;  Laterality: Left;   TONSILLECTOMY     Past Medical History:  Diagnosis Date   Alcohol use 11/08/2015   Anxiety    Aortic aneurysm (HCC)    Diabetes mellitus without complication (HCC) 2006   Kumar   GERD  (gastroesophageal reflux disease)    Herpes labialis    History of chicken pox    Hyperlipidemia    Kidney stone    There were no vitals taken for this visit.  Opioid Risk Score:   Fall Risk Score:  `1  Depression screen [facility] 2/9     09/16/2022   10:36 AM 07/10/2022   11:15 AM 05/30/2022    3:47 PM 05/23/2022    1:01 PM 03/24/2022   10:52 AM 03/24/2022   10:21 AM 03/20/2021    9:38 AM  Depression screen [facility] 2/9  Decreased Interest 0 0 0 0 1 0 0  Down, Depressed, Hopeless 0 0 0 0 1 0 0  PHQ - 2 Score 0 0 0 0 2 0 0  Altered sleeping    1 0  1  Tired, decreased energy    1 1  1   Change in appetite    1 0  1  Feeling bad or failure about yourself     1 1  0  Trouble concentrating    0 0  1  Moving slowly or fidgety/restless    0 0  0  Suicidal thoughts    0 0  0  PHQ-9 Score    4 4  4   Difficult doing work/chores    Somewhat difficult       Review of Systems  Musculoskeletal:  Positive for back pain.  All other systems reviewed and are negative.      Objective:   Physical Exam    12/18/2022   10:06 AM 12/01/2022    8:08 AM 10/15/2022   11:41 AM  Vitals with BMI  Height 5\' 7"  5\' 7"  5\' 7"   Weight 183 lbs 181 lbs 179 lbs 3 oz  BMI 28.66 28.34 28.06  Systolic 114 137 536  Diastolic 72 78 79  Pulse 56 47 56     Gen: no distress, normal appearing HEENT: oral mucosa pink and moist, NCAT Cardio: Reg rate Chest: normal effort, normal rate of breathing Abd: soft, non-distended Ext: no edema Psych: pleasant, normal affect Skin: intact Neuro: Alert and oriented, follows commands, cranial nerves II through XII intact, normal speech and language Sensation intact light touch in all 4 extremities Strength 5 out of 5 in all 4 extremities No ataxia or dysmetria Musculoskeletal:  No significant tenderness over left SI joint No significant lumbar paraspinal tenderness      Assessment & Plan:  Chronic lower back pain -Pain improved with PT -Prior exam indicated SI joint  dysfunction, SI joint injection not approved by insurance -Consider reordering injection SI joint at a later time if pain worsens -Continue PRN flexeril 5mg   -Voltaren gel ordered for L SI joint- continue -Discussed trying SI joint belt -Consider Tens unit Nexwave/Cryoheat if worsens     Anxiety -He is on celexa, could consider duloxetine for pain however would be ideal to discontinue Celexa first
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
## 2022-12-29 ENCOUNTER — Other Ambulatory Visit: Payer: Self-pay | Admitting: Endocrinology

## 2023-01-29 ENCOUNTER — Telehealth: Payer: Self-pay | Admitting: Dietician

## 2023-01-29 MED ORDER — INSULIN ASPART 100 UNIT/ML IJ SOLN: INTRAMUSCULAR | 0 refills | Status: DC

## 2023-01-29 MED ORDER — DEXCOM G6 SENSOR MISC: 3 refills | Status: DC

## 2023-01-29 NOTE — Telephone Encounter (Signed)
Patient returned my call. He had a Dexcom G6 sensor that did not come off the applicator.  Instructed him to call Dexcom to get a replacement for a faulty product.  He needs a refill for Novolog in a vial for his pump and Dexcom G6 sensors.  Forwarded this to [employer].  Oran Rein, RD, LDN, CDCES

## 2023-01-29 NOTE — Telephone Encounter (Signed)
Contact patient to schedule with new provider.

## 2023-01-29 NOTE — Telephone Encounter (Signed)
Patient left a message stating that he needs help with a problem. Called patient who was unavailable.  Left message for him to return my call.  Oran Rein, RD, LDN, CDCES

## 2023-02-09 DIAGNOSIS — M9902 Segmental and somatic dysfunction of thoracic region: Secondary | ICD-10-CM | POA: Diagnosis not present

## 2023-02-09 DIAGNOSIS — M9901 Segmental and somatic dysfunction of cervical region: Secondary | ICD-10-CM | POA: Diagnosis not present

## 2023-02-09 DIAGNOSIS — M542 Cervicalgia: Secondary | ICD-10-CM | POA: Diagnosis not present

## 2023-02-09 DIAGNOSIS — M62838 Other muscle spasm: Secondary | ICD-10-CM | POA: Diagnosis not present

## 2023-02-09 DIAGNOSIS — E1165 Type 2 diabetes mellitus with hyperglycemia: Secondary | ICD-10-CM | POA: Diagnosis not present

## 2023-02-11 ENCOUNTER — Other Ambulatory Visit: Payer: Self-pay | Admitting: Family Medicine

## 2023-02-12 DIAGNOSIS — M542 Cervicalgia: Secondary | ICD-10-CM | POA: Diagnosis not present

## 2023-02-12 DIAGNOSIS — M9901 Segmental and somatic dysfunction of cervical region: Secondary | ICD-10-CM | POA: Diagnosis not present

## 2023-02-12 DIAGNOSIS — M9902 Segmental and somatic dysfunction of thoracic region: Secondary | ICD-10-CM | POA: Diagnosis not present

## 2023-02-12 DIAGNOSIS — M62838 Other muscle spasm: Secondary | ICD-10-CM | POA: Diagnosis not present

## 2023-03-14 ENCOUNTER — Other Ambulatory Visit: Payer: Self-pay | Admitting: Family Medicine

## 2023-03-14 DIAGNOSIS — D7589 Other specified diseases of blood and blood-forming organs: Secondary | ICD-10-CM

## 2023-03-14 DIAGNOSIS — Z794 Long term (current) use of insulin: Secondary | ICD-10-CM

## 2023-03-14 DIAGNOSIS — Z125 Encounter for screening for malignant neoplasm of prostate: Secondary | ICD-10-CM

## 2023-03-14 DIAGNOSIS — Z8249 Family history of ischemic heart disease and other diseases of the circulatory system: Secondary | ICD-10-CM

## 2023-03-14 DIAGNOSIS — E1169 Type 2 diabetes mellitus with other specified complication: Secondary | ICD-10-CM

## 2023-03-20 ENCOUNTER — Other Ambulatory Visit (INDEPENDENT_AMBULATORY_CARE_PROVIDER_SITE_OTHER): Payer: 59

## 2023-03-20 DIAGNOSIS — Z8249 Family history of ischemic heart disease and other diseases of the circulatory system: Secondary | ICD-10-CM

## 2023-03-20 DIAGNOSIS — Z794 Long term (current) use of insulin: Secondary | ICD-10-CM

## 2023-03-20 DIAGNOSIS — Z125 Encounter for screening for malignant neoplasm of prostate: Secondary | ICD-10-CM

## 2023-03-20 DIAGNOSIS — E785 Hyperlipidemia, unspecified: Secondary | ICD-10-CM

## 2023-03-20 DIAGNOSIS — D7589 Other specified diseases of blood and blood-forming organs: Secondary | ICD-10-CM | POA: Diagnosis not present

## 2023-03-20 DIAGNOSIS — E1169 Type 2 diabetes mellitus with other specified complication: Secondary | ICD-10-CM

## 2023-03-20 LAB — CBC WITH DIFFERENTIAL/PLATELET
Basophils Absolute: 0 10*3/uL (ref 0.0–0.1)
Basophils Relative: 1 % (ref 0.0–3.0)
Eosinophils Absolute: 0.1 10*3/uL (ref 0.0–0.7)
Eosinophils Relative: 1.4 % (ref 0.0–5.0)
HCT: 53 % — ABNORMAL HIGH (ref 39.0–52.0)
Hemoglobin: 17.7 g/dL — ABNORMAL HIGH (ref 13.0–17.0)
Lymphocytes Relative: 38.7 % (ref 12.0–46.0)
Lymphs Abs: 1.7 10*3/uL (ref 0.7–4.0)
MCHC: 33.4 g/dL (ref 30.0–36.0)
MCV: 103.2 fL — ABNORMAL HIGH (ref 78.0–100.0)
Monocytes Absolute: 0.4 10*3/uL (ref 0.1–1.0)
Monocytes Relative: 10.4 % (ref 3.0–12.0)
Neutro Abs: 2.1 10*3/uL (ref 1.4–7.7)
Neutrophils Relative %: 48.5 % (ref 43.0–77.0)
Platelets: 234 10*3/uL (ref 150.0–400.0)
RBC: 5.13 Mil/uL (ref 4.22–5.81)
RDW: 12.9 % (ref 11.5–15.5)
WBC: 4.3 10*3/uL (ref 4.0–10.5)

## 2023-03-20 LAB — PSA: PSA: 1.15 ng/mL (ref 0.10–4.00)

## 2023-03-20 LAB — HEMOGLOBIN A1C: Hgb A1c MFr Bld: 7.7 % — ABNORMAL HIGH (ref 4.6–6.5)

## 2023-03-20 LAB — COMPREHENSIVE METABOLIC PANEL
ALT: 17 U/L (ref 0–53)
AST: 15 U/L (ref 0–37)
Albumin: 4.5 g/dL (ref 3.5–5.2)
Alkaline Phosphatase: 84 U/L (ref 39–117)
BUN: 14 mg/dL (ref 6–23)
CO2: 32 meq/L (ref 19–32)
Calcium: 9.3 mg/dL (ref 8.4–10.5)
Chloride: 102 meq/L (ref 96–112)
Creatinine, Ser: 1.12 mg/dL (ref 0.40–1.50)
GFR: 75.21 mL/min (ref >60.00)
Glucose, Bld: 125 mg/dL — ABNORMAL HIGH (ref 70–99)
Potassium: 4.1 meq/L (ref 3.5–5.1)
Sodium: 141 meq/L (ref 135–145)
Total Bilirubin: 0.6 mg/dL (ref 0.2–1.2)
Total Protein: 6.5 g/dL (ref 6.0–8.3)

## 2023-03-20 LAB — LIPID PANEL
Cholesterol: 124 mg/dL (ref 0–200)
HDL: 36.9 mg/dL — ABNORMAL LOW (ref >39.00)
LDL Cholesterol: 69 mg/dL (ref 0–99)
NonHDL: 86.98
Total CHOL/HDL Ratio: 3
Triglycerides: 89 mg/dL (ref 0.0–149.0)
VLDL: 17.8 mg/dL (ref 0.0–40.0)

## 2023-03-20 LAB — MICROALBUMIN / CREATININE URINE RATIO
Creatinine,U: 130.1 mg/dL
Microalb Creat Ratio: 0.5 mg/g (ref 0.0–30.0)
Microalb, Ur: <0.7 mg/dL (ref 0.0–1.9)

## 2023-03-20 LAB — FOLATE: Folate: 11.9 ng/mL (ref >5.9)

## 2023-03-20 LAB — VITAMIN B12: Vitamin B-12: 297 pg/mL (ref 211–911)

## 2023-03-23 DIAGNOSIS — E1165 Type 2 diabetes mellitus with hyperglycemia: Secondary | ICD-10-CM | POA: Diagnosis not present

## 2023-03-26 LAB — LIPOPROTEIN A (LPA): Lipoprotein (a): 66 nmol/L (ref <75)

## 2023-03-27 ENCOUNTER — Encounter: Payer: Self-pay | Admitting: Family Medicine

## 2023-03-27 ENCOUNTER — Ambulatory Visit (INDEPENDENT_AMBULATORY_CARE_PROVIDER_SITE_OTHER): Payer: 59 | Admitting: Family Medicine

## 2023-03-27 VITALS — BP 122/72 | HR 65 | Temp 98.6°F | Ht 66.5 in | Wt 166.5 lb

## 2023-03-27 DIAGNOSIS — F4322 Adjustment disorder with anxiety: Secondary | ICD-10-CM

## 2023-03-27 DIAGNOSIS — G4700 Insomnia, unspecified: Secondary | ICD-10-CM | POA: Diagnosis not present

## 2023-03-27 DIAGNOSIS — E1169 Type 2 diabetes mellitus with other specified complication: Secondary | ICD-10-CM

## 2023-03-27 DIAGNOSIS — I700 Atherosclerosis of aorta: Secondary | ICD-10-CM | POA: Diagnosis not present

## 2023-03-27 DIAGNOSIS — Z0000 Encounter for general adult medical examination without abnormal findings: Secondary | ICD-10-CM | POA: Diagnosis not present

## 2023-03-27 DIAGNOSIS — D7589 Other specified diseases of blood and blood-forming organs: Secondary | ICD-10-CM

## 2023-03-27 DIAGNOSIS — I10 Essential (primary) hypertension: Secondary | ICD-10-CM

## 2023-03-27 DIAGNOSIS — Z794 Long term (current) use of insulin: Secondary | ICD-10-CM

## 2023-03-27 DIAGNOSIS — Z23 Encounter for immunization: Secondary | ICD-10-CM | POA: Diagnosis not present

## 2023-03-27 DIAGNOSIS — Z87891 Personal history of nicotine dependence: Secondary | ICD-10-CM | POA: Diagnosis not present

## 2023-03-27 DIAGNOSIS — E1151 Type 2 diabetes mellitus with diabetic peripheral angiopathy without gangrene: Secondary | ICD-10-CM

## 2023-03-27 DIAGNOSIS — Z789 Other specified health status: Secondary | ICD-10-CM

## 2023-03-27 DIAGNOSIS — Z95828 Presence of other vascular implants and grafts: Secondary | ICD-10-CM

## 2023-03-27 DIAGNOSIS — K219 Gastro-esophageal reflux disease without esophagitis: Secondary | ICD-10-CM

## 2023-03-27 DIAGNOSIS — E785 Hyperlipidemia, unspecified: Secondary | ICD-10-CM | POA: Diagnosis not present

## 2023-03-27 DIAGNOSIS — I708 Atherosclerosis of other arteries: Secondary | ICD-10-CM

## 2023-03-27 DIAGNOSIS — Z8249 Family history of ischemic heart disease and other diseases of the circulatory system: Secondary | ICD-10-CM

## 2023-03-27 DIAGNOSIS — I77811 Abdominal aortic ectasia: Secondary | ICD-10-CM

## 2023-03-27 MED ORDER — SIMVASTATIN 80 MG PO TABS: 80.0000 mg | ORAL_TABLET | Freq: Every day | ORAL | 4 refills | Status: DC

## 2023-03-27 MED ORDER — BUSPIRONE HCL 15 MG PO TABS: 15.0000 mg | ORAL_TABLET | Freq: Two times a day (BID) | ORAL | 4 refills | Status: DC

## 2023-03-27 MED ORDER — TRAZODONE HCL 150 MG PO TABS: ORAL_TABLET | ORAL | 4 refills | Status: DC

## 2023-03-27 MED ORDER — ESCITALOPRAM OXALATE 20 MG PO TABS: 20.0000 mg | ORAL_TABLET | Freq: Every day | ORAL | 3 refills | Status: DC

## 2023-03-27 MED ORDER — PANTOPRAZOLE SODIUM 40 MG PO TBEC: 40.0000 mg | DELAYED_RELEASE_TABLET | Freq: Every day | ORAL | 4 refills | Status: DC

## 2023-03-27 MED ORDER — LOSARTAN POTASSIUM 50 MG PO TABS: 50.0000 mg | ORAL_TABLET | Freq: Every day | ORAL | 4 refills | Status: DC

## 2023-03-27 NOTE — Assessment & Plan Note (Signed)
Remains abstinent 

## 2023-03-27 NOTE — Assessment & Plan Note (Signed)
Chronic, stable on daily ppi

## 2023-03-27 NOTE — Assessment & Plan Note (Signed)
Continue aspirin, statin.  

## 2023-03-27 NOTE — Assessment & Plan Note (Signed)
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

## 2023-03-27 NOTE — Assessment & Plan Note (Signed)
Followed by [facility]

## 2023-03-27 NOTE — Assessment & Plan Note (Signed)
Chronic, tolerating trazodone well, will continue this.

## 2023-03-27 NOTE — Assessment & Plan Note (Signed)
Chronic, with new polycythemia - see below

## 2023-03-27 NOTE — Assessment & Plan Note (Addendum)
Chronic, stable. Continue high dose simvastatin, consider changing statins. Check coronary CT for further risk stratification.  The ASCVD Risk score (Arnett DK, et al., 2019) failed to calculate for the following reasons:   The valid total cholesterol range is 130 to 320 mg/dL

## 2023-03-27 NOTE — Assessment & Plan Note (Addendum)
Chronic, appreciate endo care. Now seeing [facility]o Dr Roanna Raider.  Did not tolerate Ozempic.

## 2023-03-27 NOTE — Assessment & Plan Note (Signed)
Previously quit, has restarted - 3-4/day on average. Encouraged limiting alcohol intake.

## 2023-03-27 NOTE — Assessment & Plan Note (Signed)
Chronic, notes worsening anxiety - will change celexa to lexapro. Continue PRN sparing hydroxyzine use.  If Lexapro ineffective, would consider cross-taper onto Cymbalta.

## 2023-03-27 NOTE — Patient Instructions (Addendum)
Flu shot today  Consider shingrix shots, let us know if interested.  Start lexapro in place of celexa - update [place] with effect Restart vitamin B12 daily.  I will order coronary CT with calcium score.  Return as needed or in 1 year for next physical

## 2023-03-27 NOTE — Progress Notes (Signed)
Ph: [phone] Fax: [phone]   Patient ID: Andrew Hart, male    DOB: [birth year], 52 y.o.   MRN: [id number]  This visit was conducted in person.  BP 122/72   Pulse 65   Temp 98.6 F (37 C) (Oral)   Ht 5' 6.5" (1.689 m)   Wt 166 lb 8 oz (75.5 kg)   SpO2 98%   BMI 26.47 kg/m   BP Readings from Last 3 Encounters:  03/27/23 122/72  12/18/22 114/72  12/01/22 137/78   CC: CPE Subjective:   HPI: Andrew Hart is a 52 y.o. male presenting on 03/27/2023 for Annual Exam   He stopped aspirin.  17 lb weight loss in the past year - since starting ozempic as per below.   DM - previously saw [facility] - established with [facility] Dr Andrew Hart. Stopped insulin pump, started ozempic almost 2 months ago. He stopped this 1.5 wks ago due to fatigue, anorexia, nausea, didn't tolerate this at all. Now on Tresiba 30u daily, continues jardiance 25mg  daily, actos 45mg  daily.   PAD s/p L CIA stent 07/2019 followed yearly by [facility], last seen 11/2022. They are also monitoring AAA (latest 3.6cm) and R CIA stenosis as pt asxs from claudication standpoint.   Chronic lower back pain persistent since early 2022. Has seen chiropractor. [facility] didn't think this was vascular related. Established with [facility], last seen 12/2022  Anxiety > depression - continues celexa 20mg  daily and buspar 15mg  bid. Notes anhedonia, trouble with motivation, procrastinating, excess worrying.   Continues dupixent for atopic dermatitis with benefit.    Preventative: Colonoscopy 02/2020 - WNL rpt 10 yrs ([facility])  Prostate cancer screening - yearly PSA Lung cancer screening - quit 2020 ~17 PY hx Flu shot yearly COVID vaccine - did not receive  Pneumovax 2016 Tdap 2014 Shingrix - discussed, decline Seat belt use discussed Sunscreen use discussed. No changing moles on skin. Sees derm yearly.  Sleep - averaging 8 hours/night Ex-smoker - 1/2 ppd x 34 yrs. 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 Interim medical history since our last visit reviewed. Allergies and medications reviewed and updated. Outpatient Medications Prior to Visit  Medication Sig Dispense Refill   acetaminophen (TYLENOL) 500 MG tablet Take 500 mg by mouth every 6 (six) hours as needed.     acyclovir (ZOVIRAX) 200 MG capsule TAKE 1 CAPSULE BY MOUTH 3 TIMES DAILY FOR 3 TO 5 DAYS AS NEEDED. 30 capsule 3   Continuous Glucose Sensor (DEXCOM G6 SENSOR) MISC Use to monitor blood sugar, change after 10 days 3 each 3   Continuous Glucose Transmitter (DEXCOM G6 TRANSMITTER) MISC APPLY 1 TRANSMITTER AS DIRECTED     Cyanocobalamin (B-12) 1000 MCG SUBL Place 1 tablet under the tongue daily.     diclofenac Sodium (VOLTAREN ARTHRITIS PAIN) 1 % GEL Apply 2 g topically 4 (four) times daily. 150 g 3   dupilumab (DUPIXENT) 300 MG/2ML prefilled syringe Inject 300 mg into the skin every 14 (fourteen) days.      halobetasol (ULTRAVATE) 0.05 % cream Apply 1 application topically 2 (two) times daily.  Can cover with Saran wrap fo r30 min.  DO NOT apply to face/groin.     hydrOXYzine (ATARAX/VISTARIL) 25 MG tablet Take 25 mg by mouth 3 (three) times daily as needed for itching.  0   Insulin Pen Needle (PEN NEEDLES) 31G X 8 MM MISC Use to inject insulin four times daily for 30 days     JARDIANCE 25 MG TABS tablet TAKE 1 TABLET (25 MG TOTAL) BY MOUTH DAILY BEFORE BREAKFAST. 30 tablet 3   pioglitazone (ACTOS) 45 MG tablet Take 1 tablet (45 mg total) by mouth daily. 90 tablet 3   TRESIBA FLEXTOUCH 100 UNIT/ML FlexTouch Pen 30 units Subcutaneous once daily for 90 days     aspirin EC 81 MG tablet Take 1 tablet (81 mg total)  by mouth every Monday, Wednesday, and Friday.     BAYER MICROLET LANCETS lancets Use as instructed to check blood sugar twice a day dx E11.65 100 each 3   Blood Glucose Monitoring Suppl (CONTOUR NEXT MONITOR) w/Device KIT CHECK BLOOD SUGAR TWICE DAILY 1 kit 0   busPIRone (BUSPAR) 15 MG tablet Take 1 tablet (15 mg total) by mouth 2 (two) times daily. 180 tablet 3   cholecalciferol (VITAMIN D3) 25 MCG (1000 UNIT) tablet Take 1,000 Units by mouth daily.     citalopram (CELEXA) 20 MG tablet Take 1 tablet (20 mg total) by mouth daily. 90 tablet 3   cyclobenzaprine (FLEXERIL) 5 MG tablet Take 1 tablet (5 mg total) by mouth 2 (two) times daily as needed for muscle spasms. 30 tablet 0   glucose blood (ONETOUCH VERIO) test strip Use as instructed 100 each 2   ibuprofen (ADVIL) 200 MG tablet Take 400 mg by mouth every 6 (six) hours as needed for headache or moderate pain.     losartan (COZAAR) 50 MG tablet Take 1 tablet (50 mg total) by mouth daily. 90 tablet 3   pantoprazole (PROTONIX) 40 MG tablet Take 1 tablet (40 mg total) by mouth daily. 90 tablet 3   simvastatin (ZOCOR) 80 MG tablet Take 1 tablet (80 mg total) by mouth daily. 90 tablet 3   traZODone (DESYREL) 150 MG tablet TAKE 1/2 TABLET (75 MG TOTAL) BY MOUTH AT BEDTIME. 135 tablet 3   Continuous Blood Gluc Sensor (DEXCOM G7 SENSOR) MISC CHANGE EVERY 10 DAYS 3 each 3   insulin aspart (NOVOLOG FLEXPEN) 100 UNIT/ML FlexPen Inject 12 Units into the skin 3 (three) times daily with meals. 15 mL 1   insulin aspart (NOVOLOG) 100 UNIT/ML injection Use for Insulin Pump (56 units) 20 mL 0   Insulin Disposable Pump (OMNIPOD 5 G6 INTRO, GEN 5,) KIT Use as directed. 1 kit 0   Insulin Disposable Pump (OMNIPOD 5 G6 PODS, GEN 5,) MISC 1 Device by Does not apply route every 3 (three) days. 2 each 3   Insulin Glargine (BASAGLAR KWIKPEN) 100 UNIT/ML START WITH 6 UNITS, ADJUST UP TO 20 UNITS AT NIGHT DAILY AS DIRECTED 15 mL 1   Insulin Pen Needle (PEN NEEDLES) 32G X 4  MM MISC Use to inject insulin 2x daily into skin 100 each 2   No facility-administered medications prior to visit.     Per HPI unless specifically indicated in ROS section below Review of Systems  Constitutional:  Negative for activity change, appetite change, chills, fatigue, fever and unexpected weight change.  HENT:  Positive for tinnitus. Negative for hearing loss.   Eyes:  Negative for visual disturbance.  Respiratory:  Negative for cough,  chest tightness, shortness of breath and wheezing.   Cardiovascular:  Negative for chest pain, palpitations and leg swelling.  Gastrointestinal:  Positive for diarrhea. Negative for abdominal distention, abdominal pain, blood in stool, constipation, nausea and vomiting.  Genitourinary:  Negative for difficulty urinating and hematuria.  Musculoskeletal:  Negative for arthralgias, myalgias and neck pain.  Skin:  Negative for rash.  Neurological:  Positive for dizziness (?ozempic related). Negative for seizures, syncope and headaches.  Hematological:  Negative for adenopathy. Does not bruise/bleed easily.  Psychiatric/Behavioral:  Negative for dysphoric mood. The patient is nervous/anxious.     Objective:  BP 122/72   Pulse 65   Temp 98.6 F (37 C) (Oral)   Ht 5' 6.5" (1.689 m)   Wt 166 lb 8 oz (75.5 kg)   SpO2 98%   BMI 26.47 kg/m   Wt Readings from Last 3 Encounters:  03/27/23 166 lb 8 oz (75.5 kg)  12/18/22 183 lb (83 kg)  12/01/22 181 lb (82.1 kg)      Physical Exam Vitals and nursing note reviewed.  Constitutional:      General: He is not in acute distress.    Appearance: Normal appearance. He is well-developed. He is not ill-appearing.  HENT:     Head: Normocephalic and atraumatic.     Right Ear: Hearing, tympanic membrane, ear canal and external ear normal.     Left Ear: Hearing, tympanic membrane, ear canal and external ear normal.     Mouth/Throat:     Mouth: Mucous membranes are moist.     Pharynx: Oropharynx is clear. No  oropharyngeal exudate or posterior oropharyngeal erythema.  Eyes:     General: No scleral icterus.    Extraocular Movements: Extraocular movements intact.     Conjunctiva/sclera: Conjunctivae normal.     Pupils: Pupils are equal, round, and reactive to light.  Neck:     Thyroid: No thyroid mass or thyromegaly.     Vascular: No carotid bruit.  Cardiovascular:     Rate and Rhythm: Normal rate and regular rhythm.     Pulses: Normal pulses.          Radial pulses are 2+ on the right side and 2+ on the left side.     Heart sounds: Normal heart sounds. No murmur heard. Pulmonary:     Effort: Pulmonary effort is normal. No respiratory distress.     Breath sounds: Normal breath sounds. No wheezing, rhonchi or rales.  Abdominal:     General: Bowel sounds are normal. There is no distension.     Palpations: Abdomen is soft. There is no mass.     Tenderness: There is no abdominal tenderness. There is no guarding or rebound.     Hernia: No hernia is present.  Musculoskeletal:        General: Normal range of motion.     Cervical back: Normal range of motion and neck supple.     Right lower leg: No edema.     Left lower leg: No edema.  Lymphadenopathy:     Cervical: No cervical adenopathy.  Skin:    General: Skin is warm and dry.     Findings: No rash.  Neurological:     General: No focal deficit present.     Mental Status: He is alert and oriented to person, place, and time.  Psychiatric:        Mood and Affect: Mood normal.        Behavior: Behavior normal.  Thought Content: Thought content normal.        Judgment: Judgment normal.       Results for orders placed or performed in visit on 03/20/23  CBC with Differential/Platelet  Result Value Ref Range   WBC 4.3 4.0 - 10.5 K/uL   RBC 5.13 4.22 - 5.81 Mil/uL   Hemoglobin 17.7 (H) 13.0 - 17.0 g/dL   HCT 44.0 (H) 34.7 - 42.5 %   MCV 103.2 (H) 78.0 - 100.0 fl   MCHC 33.4 30.0 - 36.0 g/dL   RDW 95.6 38.7 - 56.4 %   Platelets  234.0 150.0 - 400.0 K/uL   Neutrophils Relative % 48.5 43.0 - 77.0 %   Lymphocytes Relative 38.7 12.0 - 46.0 %   Monocytes Relative 10.4 3.0 - 12.0 %   Eosinophils Relative 1.4 0.0 - 5.0 %   Basophils Relative 1.0 0.0 - 3.0 %   Neutro Abs 2.1 1.4 - 7.7 K/uL   Lymphs Abs 1.7 0.7 - 4.0 K/uL   Monocytes Absolute 0.4 0.1 - 1.0 K/uL   Eosinophils Absolute 0.1 0.0 - 0.7 K/uL   Basophils Absolute 0.0 0.0 - 0.1 K/uL  Folate  Result Value Ref Range   Folate 11.9 >5.9 ng/mL  Lipoprotein A (LPA)  Result Value Ref Range   Lipoprotein (a) 66 <75 nmol/L  Vitamin B12  Result Value Ref Range   Vitamin B-12 297 211 - 911 pg/mL  PSA  Result Value Ref Range   PSA 1.15 0.10 - 4.00 ng/mL  Comprehensive metabolic panel  Result Value Ref Range   Sodium 141 135 - 145 mEq/L   Potassium 4.1 3.5 - 5.1 mEq/L   Chloride 102 96 - 112 mEq/L   CO2 32 19 - 32 mEq/L   Glucose, Bld 125 (H) 70 - 99 mg/dL   BUN 14 6 - 23 mg/dL   Creatinine, Ser 3.32 0.40 - 1.50 mg/dL   Total Bilirubin 0.6 0.2 - 1.2 mg/dL   Alkaline Phosphatase 84 39 - 117 U/L   AST 15 0 - 37 U/L   ALT 17 0 - 53 U/L   Total Protein 6.5 6.0 - 8.3 g/dL   Albumin 4.5 3.5 - 5.2 g/dL   GFR 95.18 >84.16 mL/min   Calcium 9.3 8.4 - 10.5 mg/dL  Lipid panel  Result Value Ref Range   Cholesterol 124 0 - 200 mg/dL   Triglycerides 60.6 0.0 - 149.0 mg/dL   HDL 30.16 (L) >01.09 mg/dL   VLDL 32.3 0.0 - 55.7 mg/dL   LDL Cholesterol 69 0 - 99 mg/dL   Total CHOL/HDL Ratio 3    NonHDL 86.98   Microalbumin / creatinine urine ratio  Result Value Ref Range   Microalb, Ur <0.7 0.0 - 1.9 mg/dL   Creatinine,U 322.0 mg/dL   Microalb Creat Ratio 0.5 0.0 - 30.0 mg/g  Hemoglobin A1c  Result Value Ref Range   Hgb A1c MFr Bld 7.7 (H) 4.6 - 6.5 %      03/27/2023   11:25 AM 12/18/2022   10:09 AM 09/16/2022   10:36 AM 07/10/2022   11:15 AM 05/30/2022    3:47 PM  Depression screen PHQ 2/9  Decreased Interest 2 0 0 0 0  Down, Depressed, Hopeless 1 0 0 0 0  PHQ  - 2 Score 3 0 0 0 0  Altered sleeping 1      Tired, decreased energy 2      Change in appetite 1      Feeling  bad or failure about yourself  1      Trouble concentrating 1      Moving slowly or fidgety/restless 0      Suicidal thoughts 0      PHQ-9 Score 9      Difficult doing work/chores Somewhat difficult           03/27/2023   11:26 AM 03/24/2022   10:54 AM 03/20/2021    9:38 AM 03/19/2020    9:36 AM  GAD 7 : Generalized Anxiety Score  Nervous, Anxious, on Edge 2 1 2 1   Control/stop worrying 1 0 0 0  Worry too much - different things 1 0 1 0  Trouble relaxing 1 1 1 1   Restless 0 0 0 0  Easily annoyed or irritable 2 1 2 1   Afraid - awful might happen 2 1 1 1   Total GAD 7 Score 9 4 7 4   Anxiety Difficulty Somewhat difficult      Assessment & Plan:   Problem List Items Addressed This Visit     Health maintenance examination - Primary (Chronic)    Preventative protocols reviewed and updated unless pt declined. Discussed healthy diet and lifestyle.       Type 2 diabetes mellitus with other specified complication (HCC)    Chronic, appreciate endo care. Now seeing [facility] endo Dr Andrew Hart.  Did not tolerate Ozempic.       Relevant Medications   TRESIBA FLEXTOUCH 100 UNIT/ML FlexTouch Pen   losartan (COZAAR) 50 MG tablet   simvastatin (ZOCOR) 80 MG tablet   Hyperlipidemia associated with type 2 diabetes mellitus (HCC)    Chronic, stable. Continue high dose simvastatin, consider changing statins. Check coronary CT for further risk stratification.  The ASCVD Risk score (Arnett DK, et al., 2019) failed to calculate for the following reasons:   The valid total cholesterol range is 130 to 320 mg/dL       Relevant Medications   TRESIBA FLEXTOUCH 100 UNIT/ML FlexTouch Pen   losartan (COZAAR) 50 MG tablet   simvastatin (ZOCOR) 80 MG tablet   Other Relevant Orders   CT CARDIAC SCORING (SELF PAY ONLY)   Insomnia    Chronic, tolerating trazodone well, will continue this.        Relevant Medications   traZODone (DESYREL) 150 MG tablet   Adjustment disorder with anxiety    Chronic, notes worsening anxiety - will change celexa to lexapro. Continue PRN sparing hydroxyzine use.  If Lexapro ineffective, would consider cross-taper onto Cymbalta.       Relevant Medications   busPIRone (BUSPAR) 15 MG tablet   GERD (gastroesophageal reflux disease)    Chronic, stable on daily ppi.       Relevant Medications   pantoprazole (PROTONIX) 40 MG tablet   Alcohol use    Previously quit, has restarted - 3-4/day on average. Encouraged limiting alcohol intake.      Ex-smoker    Remains abstinent.       Family history of premature CAD    Will check coronary CT for further risk stratification. He continues simvastatin 80mg  nightly.       Macrocytosis    Chronic, with new polycythemia - see below      Abdominal aortic ectasia (HCC)    Followed by [facility].       Relevant Medications   losartan (COZAAR) 50 MG tablet   simvastatin (ZOCOR) 80 MG tablet   Aorto-iliac atherosclerosis (HCC)    Continue aspirin, statin.  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      Relevant Medications   TRESIBA FLEXTOUCH 100 UNIT/ML FlexTouch Pen   losartan (COZAAR) 50 MG tablet   simvastatin (ZOCOR) 80 MG tablet   S/P insertion of iliac artery stent   Primary hypertension    Chronic stable on current regimen - continue      Relevant Medications   losartan (COZAAR) 50 MG tablet   simvastatin (ZOCOR) 80 MG tablet   Stenosis of common iliac artery (HCC)   Relevant Medications   losartan (COZAAR) 50 MG tablet   simvastatin (ZOCOR) 80 MG tablet   Other Visit Diagnoses     Encounter for immunization       Relevant Orders   Flu vaccine trivalent PF, 6mos and  older(Flulaval,Afluria,Fluarix,Fluzone) (Completed)        Meds ordered this encounter  Medications   busPIRone (BUSPAR) 15 MG tablet    Sig: Take 1 tablet (15 mg total) by mouth 2 (two) times daily.    Dispense:  180 tablet    Refill:  4   losartan (COZAAR) 50 MG tablet    Sig: Take 1 tablet (50 mg total) by mouth daily.    Dispense:  90 tablet    Refill:  4   pantoprazole (PROTONIX) 40 MG tablet    Sig: Take 1 tablet (40 mg total) by mouth daily.    Dispense:  90 tablet    Refill:  4   simvastatin (ZOCOR) 80 MG tablet    Sig: Take 1 tablet (80 mg total) by mouth daily.    Dispense:  90 tablet    Refill:  4   traZODone (DESYREL) 150 MG tablet    Sig: TAKE 1/2 TABLET (75 MG TOTAL) BY MOUTH AT BEDTIME.    Dispense:  45 tablet    Refill:  4   escitalopram (LEXAPRO) 20 MG tablet    Sig: Take 1 tablet (20 mg total) by mouth daily.    Dispense:  90 tablet    Refill:  3    To replace celexa    Orders Placed This Encounter  Procedures   CT CARDIAC SCORING (SELF PAY ONLY)    Standing Status:   Future    Standing Expiration Date:   03/26/2024    Order Specific Question:   Preferred imaging location?    Answer:   [facility]   Flu vaccine trivalent PF, 6mos and older(Flulaval,Afluria,Fluarix,Fluzone)    Patient Instructions  Flu shot today  Consider shingrix shots, let us know if interested.  Start lexapro in place of celexa - update [place] with effect Restart vitamin B12 daily.  I will order coronary CT with calcium score.  Return as needed or in 1 year for next physical   Follow up plan: Return in about 1 year (around 03/26/2024) for annual exam, prior fasting for blood work.  Eustaquio Boyden, MD

## 2023-03-27 NOTE — Assessment & Plan Note (Signed)
Will check coronary CT for further risk stratification. He continues simvastatin 80mg  nightly.

## 2023-03-27 NOTE — Assessment & Plan Note (Signed)
Preventative protocols reviewed and updated unless pt declined. Discussed healthy diet and lifestyle.  

## 2023-03-27 NOTE — Assessment & Plan Note (Signed)
Chronic stable on current regimen - continue.

## 2023-04-06 ENCOUNTER — Encounter: Payer: Self-pay | Admitting: Family Medicine

## 2023-04-06 DIAGNOSIS — H16223 Keratoconjunctivitis sicca, not specified as Sjogren's, bilateral: Secondary | ICD-10-CM | POA: Insufficient documentation

## 2023-04-07 DIAGNOSIS — M62838 Other muscle spasm: Secondary | ICD-10-CM | POA: Diagnosis not present

## 2023-04-07 DIAGNOSIS — M9902 Segmental and somatic dysfunction of thoracic region: Secondary | ICD-10-CM | POA: Diagnosis not present

## 2023-04-07 DIAGNOSIS — M9901 Segmental and somatic dysfunction of cervical region: Secondary | ICD-10-CM | POA: Diagnosis not present

## 2023-04-07 DIAGNOSIS — M542 Cervicalgia: Secondary | ICD-10-CM | POA: Diagnosis not present

## 2023-04-21 ENCOUNTER — Ambulatory Visit (HOSPITAL_BASED_OUTPATIENT_CLINIC_OR_DEPARTMENT_OTHER)
Admission: RE | Admit: 2023-04-21 | Discharge: 2023-04-21 | Disposition: A | Discharge status: Home / Self Care | Payer: Self-pay | Source: Ambulatory Visit | Attending: Family Medicine | Admitting: Family Medicine

## 2023-04-21 DIAGNOSIS — E1169 Type 2 diabetes mellitus with other specified complication: Secondary | ICD-10-CM | POA: Insufficient documentation

## 2023-04-21 DIAGNOSIS — E785 Hyperlipidemia, unspecified: Secondary | ICD-10-CM | POA: Insufficient documentation

## 2023-04-30 ENCOUNTER — Other Ambulatory Visit: Payer: Self-pay | Admitting: Family Medicine

## 2023-04-30 DIAGNOSIS — R931 Abnormal findings on diagnostic imaging of heart and coronary circulation: Secondary | ICD-10-CM | POA: Insufficient documentation

## 2023-04-30 DIAGNOSIS — Z8249 Family history of ischemic heart disease and other diseases of the circulatory system: Secondary | ICD-10-CM

## 2023-04-30 DIAGNOSIS — E1169 Type 2 diabetes mellitus with other specified complication: Secondary | ICD-10-CM

## 2023-04-30 MED ORDER — ASPIRIN 81 MG PO TBEC: 81.0000 mg | DELAYED_RELEASE_TABLET | Freq: Every day | ORAL | Status: AC

## 2023-05-01 MED ORDER — ROSUVASTATIN CALCIUM 40 MG PO TABS: 40.0000 mg | ORAL_TABLET | Freq: Every day | ORAL | 3 refills | Status: DC

## 2023-05-01 NOTE — Addendum Note (Signed)
Addended by: Eustaquio Boyden on: 05/01/2023 07:33 AM   Modules accepted: Orders

## 2023-05-08 ENCOUNTER — Encounter: Payer: Self-pay | Admitting: Family Medicine

## 2023-05-18 NOTE — Progress Notes (Signed)
Cardiology Office Note:    Date:  05/26/2023   ID:  [name], DOB 02-16-1971, MRN [id number]  PCP:  Andrew Boyden, MD   [facility] Providers Cardiologist:  None     Referring MD: Andrew Boyden, MD   Chief Complaint  Patient presents with   coronary calcification    History of Present Illness:    Andrew Hart is a 53 y.o. male seen at the request of Dr Sharen Hones for evaluation of a high coronary calcium score. He has a history of DM, HLD and strong family history of CAD. He has a history of PAD and is s/p stenting of the left iliac artery in 2021. Also has AAA 3.6 cm.  Followed by [facility].  He had a coronary calcium score for screening. He denies any chest pain, dyspnea or palpitations. No claudication now.    Past Medical History:  Diagnosis Date   Alcohol use 11/08/2015   Anxiety    Aortic aneurysm (HCC)    Diabetes mellitus without complication (HCC) 2006   Kumar   GERD (gastroesophageal reflux disease)    Herpes labialis    History of chicken pox    Hyperlipidemia    Kidney stone     Past Surgical History:  Procedure Laterality Date   ABDOMINAL AORTOGRAM W/LOWER EXTREMITY N/A 07/22/2019   Procedure: ABDOMINAL AORTOGRAM W/LOWER EXTREMITY;  Surgeon: Sherren Kerns, MD;  Location: [facility];  Service: Cardiovascular;  Laterality: N/A;   APPENDECTOMY     FINGER SURGERY Right    2 screws in ring finger   PERIPHERAL VASCULAR INTERVENTION Left 07/22/2019   Procedure: PERIPHERAL VASCULAR INTERVENTION;  Surgeon: Sherren Kerns, MD;  Location: [facility];  Service: Cardiovascular;  Laterality: Left;   TONSILLECTOMY      Current Medications: Current Meds  Medication Sig   acetaminophen (TYLENOL) 500 MG tablet Take 500 mg by mouth every 6 (six) hours as needed.   acyclovir (ZOVIRAX) 200 MG capsule TAKE 1 CAPSULE BY MOUTH 3 TIMES DAILY FOR 3 TO 5 DAYS AS NEEDED.   aspirin EC 81 MG tablet Take 1 tablet (81 mg total) by mouth  daily. Swallow whole.   busPIRone (BUSPAR) 15 MG tablet Take 1 tablet (15 mg total) by mouth 2 (two) times daily.   Continuous Glucose Sensor (DEXCOM G6 SENSOR) MISC Use to monitor blood sugar, change after 10 days   Continuous Glucose Transmitter (DEXCOM G6 TRANSMITTER) MISC APPLY 1 TRANSMITTER AS DIRECTED   Cyanocobalamin (B-12) 1000 MCG SUBL Place 1 tablet under the tongue daily.   diclofenac Sodium (VOLTAREN ARTHRITIS PAIN) 1 % GEL Apply 2 g topically 4 (four) times daily.   dupilumab (DUPIXENT) 300 MG/2ML prefilled syringe Inject 300 mg into the skin every 14 (fourteen) days.    escitalopram (LEXAPRO) 20 MG tablet Take 1 tablet (20 mg total) by mouth daily.   halobetasol (ULTRAVATE) 0.05 % cream Apply 1 application topically 2 (two) times daily. Can cover with Saran wrap fo r30 min.  DO NOT apply to face/groin.   hydrOXYzine (ATARAX/VISTARIL) 25 MG tablet Take 25 mg by mouth 3 (three) times daily as needed for itching.   Insulin Pen Needle (PEN NEEDLES) 31G X 8 MM MISC Use to inject insulin four times daily for 30 days   JARDIANCE 25 MG TABS tablet TAKE 1 TABLET (25 MG TOTAL) BY MOUTH DAILY BEFORE BREAKFAST.   losartan (COZAAR) 50 MG tablet Take 1 tablet (50 mg total) by mouth daily.  pantoprazole (PROTONIX) 40 MG tablet Take 1 tablet (40 mg total) by mouth daily.   pioglitazone (ACTOS) 45 MG tablet Take 1 tablet (45 mg total) by mouth daily.   rosuvastatin (CRESTOR) 40 MG tablet Take 1 tablet (40 mg total) by mouth daily.   traZODone (DESYREL) 150 MG tablet TAKE 1/2 TABLET (75 MG TOTAL) BY MOUTH AT BEDTIME.   TRESIBA FLEXTOUCH 100 UNIT/ML FlexTouch Pen 30 units Subcutaneous once daily for 90 days     Allergies:   Janumet xr [sitagliptin-metformin hcl er], Jardiance [empagliflozin], Metformin and related, Methotrexate derivatives, and Other   Social History   Socioeconomic History   Marital status: Married    Spouse name: Not on file   Number of children: 2   Years of education: Not  on file   Highest education level: Not on file  Occupational History   Not on file  Tobacco Use   Smoking status: Former    Current packs/day: 0.00    Average packs/day: 0.5 packs/day for 15.0 years (7.5 ttl pk-yrs)    Types: Cigarettes    Start date: 02/03/2004    Quit date: 02/03/2019    Years since quitting: 4.3    Passive exposure: Never   Smokeless tobacco: Never  Vaping Use   Vaping status: Never Used  Substance and Sexual Activity   Alcohol use: Not Currently    Alcohol/week: 0.0 standard drinks of alcohol    Comment: Regular beer (12 pk/wkend)   Drug use: Not Currently    Comment: MJ (occasional)   Sexual activity: Yes    Partners: Female    Comment: MARRIED  Other Topics Concern   Not on file  Social History Narrative   Lives with wife and 2 children (daughter 2003, son 2006)    Occ: owns Education officer, environmental business    Activity: active at work, at home on yard   Diet: some water, fruits/vegetables daily    Social Drivers of Corporate investment banker Strain: Not on Ship broker Insecurity: Not on file  Transportation Needs: Not on file  Physical Activity: Not on file  Stress: Not on file  Social Connections: Not on file     Family History: The patient's family history includes CAD (age of onset: 88) in his father; Diabetes in his maternal grandmother and mother; Heart attack (age of onset: 66) in his father; Parkinsonism (age of onset: 66) in his mother. There is no history of Stroke, Cancer, Colon cancer, Esophageal cancer, Rectal cancer, or Stomach cancer.  ROS:   Please see the history of present illness.     All other systems reviewed and are negative.  EKGs/Labs/Other Studies Reviewed:    The following studies were reviewed today:  EKG Interpretation Date/Time:  Tuesday May 26 2023 11:23:07 EST Ventricular Rate:  63 PR Interval:  144 QRS Duration:  80 QT Interval:  410 QTC Calculation: 419 R Axis:   29  Text Interpretation: Normal sinus rhythm  Normal ECG When compared with ECG of 22-Jul-2019 10:16, No significant change was found Confirmed by [place], Pietro Bonura [id number]) on 05/26/2023 11:29:52 AM    Coronary calcium score 04/21/23: Coronary Calcium Score   TECHNIQUE: A gated, non-contrast computed tomography scan of the heart was performed using 3mm slice thickness. Axial images were analyzed on a dedicated workstation. Calcium scoring of the coronary arteries was performed using the Agatston method.   FINDINGS: Coronary Calcium Score:   Left main: 0   Left anterior descending artery: 375   Left  circumflex artery: 324   Right coronary artery: 123   Total: 822   Percentile: 99   Pericardium: Normal.   Ascending Aorta: Normal caliber.  Aortic atherosclerosis.   Non-cardiac: See separate report from [facility].   IMPRESSION: Coronary calcium score of 822. This was 36 percentile for age-, race-, and sex-matched controls.   Aortic atherosclerosis. EKG Interpretation Date/Time:  Tuesday May 26 2023 11:23:07 EST Ventricular Rate:  63 PR Interval:  144 QRS Duration:  80 QT Interval:  410 QTC Calculation: 419 R Axis:   29  Text Interpretation: Normal sinus rhythm Normal ECG When compared with ECG of 22-Jul-2019 10:16, No significant change was found Confirmed by [place], Lovelle Lema [phone]) on 05/26/2023 11:29:52 AM    Recent Labs: 03/20/2023: ALT 17; BUN 14; Creatinine, Ser 1.12; Hemoglobin 17.7; Platelets 234.0; Potassium 4.1; Sodium 141  Recent Lipid Panel    Component Value Date/Time   CHOL 124 03/20/2023 0903   TRIG 89.0 03/20/2023 0903   HDL 36.90 (L) 03/20/2023 0903   CHOLHDL 3 03/20/2023 0903   VLDL 17.8 03/20/2023 0903   LDLCALC 69 03/20/2023 0903   LDLDIRECT 79.0 04/14/2022 1018     Risk Assessment/Calculations:                Physical Exam:    VS:  BP 120/72   Pulse 63   Ht 5\' 7"  (1.702 m)   Wt 183 lb (83 kg)   SpO2 98%   BMI 28.66 kg/m     Wt Readings from Last 3 Encounters:   05/26/23 183 lb (83 kg)  03/27/23 166 lb 8 oz (75.5 kg)  12/18/22 183 lb (83 kg)     GEN:  Well nourished, well developed in no acute distress HEENT: Normal NECK: No JVD; No carotid bruits LYMPHATICS: No lymphadenopathy CARDIAC: RRR, soft 1/6 systolic murmur at the apex. no rubs, gallops RESPIRATORY:  Clear to auscultation without rales, wheezing or rhonchi  ABDOMEN: Soft, non-tender, non-distended MUSCULOSKELETAL:  No edema; No deformity  SKIN: Warm and dry NEUROLOGIC:  Alert and oriented x 3 PSYCHIATRIC:  Normal affect   ASSESSMENT:    1. Coronary artery disease involving native coronary artery of native heart without angina pectoris   2. PAD (peripheral artery disease) (HCC)   3. Abdominal aortic aneurysm (AAA) without rupture, unspecified part (HCC)   4. Hypercholesterolemia   5. Type 2 diabetes mellitus with other specified complication, without long-term current use of insulin (HCC)    PLAN:    In order of problems listed above:  CAD. High coronary calcium score. No significant angina. Given age and associated risk factors I have recommended an ETT to rule out severe disease. If ETT looks ok will focus on risk factor modification. Continue ASA 81 mg daily HLD recently switched to Crestor 40 mg daily. Goal LDL < 55 based on risk factors.  DM type 2 now followed by endocrinology. Reports last A1c 7.1% HTN well controlled PAD s/p left iliac stent. Last dopplers indicated disease in right leg but asymptomatic. Followed by [facility] AAA followed by [facility] Family history of CAD and aneurysm      Informed Consent   Shared Decision Making/Informed Consent The risks [chest pain, shortness of breath, cardiac arrhythmias, dizziness, blood pressure fluctuations, myocardial infarction, stroke/transient ischemic attack, and life-threatening complications (estimated to be 1 in 10,000)], benefits (risk stratification, diagnosing coronary artery disease, treatment guidance) and alternatives of  an exercise tolerance test were discussed in detail with Mr. Dobkins and he agrees  to proceed.       Medication Adjustments/Labs and Tests Ordered: Current medicines are reviewed at length with the patient today.  Concerns regarding medicines are outlined above.  Orders Placed This Encounter  Procedures   Exercise Tolerance Test   EKG 12-Lead   No orders of the defined types were placed in this encounter.   There are no Patient Instructions on file for this visit.   Signed, Daylin Gruszka [place], MD  05/26/2023 11:51 AM    [facility]

## 2023-05-26 ENCOUNTER — Ambulatory Visit: Payer: Commercial Managed Care - HMO | Attending: Cardiology | Admitting: Cardiology

## 2023-05-26 ENCOUNTER — Other Ambulatory Visit: Payer: Self-pay | Admitting: Family Medicine

## 2023-05-26 ENCOUNTER — Encounter: Payer: Self-pay | Admitting: Cardiology

## 2023-05-26 VITALS — BP 120/72 | HR 63 | Ht 67.0 in | Wt 183.0 lb

## 2023-05-26 DIAGNOSIS — K219 Gastro-esophageal reflux disease without esophagitis: Secondary | ICD-10-CM

## 2023-05-26 DIAGNOSIS — E1169 Type 2 diabetes mellitus with other specified complication: Secondary | ICD-10-CM

## 2023-05-26 DIAGNOSIS — E7800 Pure hypercholesterolemia, unspecified: Secondary | ICD-10-CM | POA: Diagnosis not present

## 2023-05-26 DIAGNOSIS — I10 Essential (primary) hypertension: Secondary | ICD-10-CM

## 2023-05-26 DIAGNOSIS — I739 Peripheral vascular disease, unspecified: Secondary | ICD-10-CM

## 2023-05-26 DIAGNOSIS — I714 Abdominal aortic aneurysm, without rupture, unspecified: Secondary | ICD-10-CM

## 2023-05-26 DIAGNOSIS — I2510 Atherosclerotic heart disease of native coronary artery without angina pectoris: Secondary | ICD-10-CM | POA: Diagnosis not present

## 2023-05-26 DIAGNOSIS — G4700 Insomnia, unspecified: Secondary | ICD-10-CM

## 2023-05-26 DIAGNOSIS — F4322 Adjustment disorder with anxiety: Secondary | ICD-10-CM

## 2023-05-26 MED ORDER — PIOGLITAZONE HCL 45 MG PO TABS: 45.0000 mg | ORAL_TABLET | Freq: Every day | ORAL | 3 refills | Status: DC

## 2023-05-26 MED ORDER — PANTOPRAZOLE SODIUM 40 MG PO TBEC: 40.0000 mg | DELAYED_RELEASE_TABLET | Freq: Every day | ORAL | 3 refills | Status: DC

## 2023-05-26 MED ORDER — ESCITALOPRAM OXALATE 20 MG PO TABS: 20.0000 mg | ORAL_TABLET | Freq: Every day | ORAL | 3 refills | Status: DC

## 2023-05-26 MED ORDER — TRAZODONE HCL 150 MG PO TABS: ORAL_TABLET | ORAL | 3 refills | Status: DC

## 2023-05-26 MED ORDER — ROSUVASTATIN CALCIUM 40 MG PO TABS: 40.0000 mg | ORAL_TABLET | Freq: Every day | ORAL | 3 refills | Status: DC

## 2023-05-26 MED ORDER — LOSARTAN POTASSIUM 50 MG PO TABS: 50.0000 mg | ORAL_TABLET | Freq: Every day | ORAL | 3 refills | Status: DC

## 2023-05-26 MED ORDER — BUSPIRONE HCL 15 MG PO TABS: 15.0000 mg | ORAL_TABLET | Freq: Two times a day (BID) | ORAL | 3 refills | Status: DC

## 2023-05-26 NOTE — Telephone Encounter (Signed)
Acyclovir Last rx:  02/13/23, #30 Last OV:  03/27/23, CPE Next OV:  none

## 2023-05-26 NOTE — Telephone Encounter (Signed)
Copied from CRM [id number]. Topic: Clinical - Medication Refill >> May 26, 2023  2:39 PM Adaysia C wrote: Most Recent Primary Care Visit:  Provider: Eustaquio Boyden  Department: [facility]  Visit Type: PHYSICAL  Date: 03/27/2023  Medication: acyclovir (ZOVIRAX) 200 MG capsule traZODone (DESYREL) 150 MG tablet escitalopram (LEXAPRO) 20 MG tablet  Has the patient contacted their pharmacy? Yes, they have not received these RX refills  (Agent: If no, request that the patient contact the pharmacy for the refill. If patient does not wish to contact the pharmacy document the reason why and proceed with request.) (Agent: If yes, when and what did the pharmacy advise?)  Is this the correct pharmacy for this prescription? Yes If no, delete pharmacy and type the correct one.  This is the patient's preferred pharmacy:  [facility] [address] ([address] [place] - [address] [place] ([place]) [place] [address] Phone: [phone] Fax: [phone]   Has the prescription been filled recently? No  Is the patient out of the medication? Yes  Has the patient been seen for an appointment in the last year OR does the patient have an upcoming appointment?   Can we respond through MyChart?   Agent: Please be advised that Rx refills may take up to 3 business days. We ask that you follow-up with your pharmacy.

## 2023-05-26 NOTE — Telephone Encounter (Unsigned)
Copied from CRM [id number]. Topic: Clinical - Medication Refill >> May 26, 2023 10:00 AM Almira Coaster wrote: Most Recent Primary Care Visit:  Provider: Eustaquio Boyden  Department: [facility]  Visit Type: PHYSICAL  Date: 03/27/2023  Medication: JARDIANCE 25 MG TABS tablet, pantoprazole (PROTONIX) 40 MG tablet ,pioglitazone (ACTOS) 45 MG table,busPIRone (BUSPAR) 15 MG tablet, losartan (COZAAR) 50 MG tablet, rosuvastatin (CRESTOR) 40 MG tablet  Has the patient contacted their pharmacy? No, changed pharmacy due to insurance.  (Agent: If no, request that the patient contact the pharmacy for the refill. If patient does not wish to contact the pharmacy document the reason why and proceed with request.) (Agent: If yes, when and what did the pharmacy advise?)  Is this the correct pharmacy for this prescription? Yes If no, delete pharmacy and type the correct one.  This is the patient's preferred pharmacy:  [facility] [address] ([address] [place] - [address] [place] ([place]) [place] [address] Phone: [phone] Fax: [phone]   Has the prescription been filled recently? No  Is the patient out of the medication? Yes  Has the patient been seen for an appointment in the last year OR does the patient have an upcoming appointment? Yes, last appointment was on 03/27/2023  Can we respond through MyChart? Yes  Agent: Please be advised that Rx refills may take up to 3 business days. We ask that you follow-up with your pharmacy.

## 2023-05-26 NOTE — Patient Instructions (Signed)
Medication Instructions:  Continue same medications *If you need a refill on your cardiac medications before your next appointment, please call your pharmacy*   Lab Work: None ordered   Testing/Procedures: Stress Test ( POET )    Follow-Up: At [facility], you and your health needs are our priority.  As part of our continuing mission to provide you with exceptional heart care, we have created designated Provider Care Teams.  These Care Teams include your primary Cardiologist (physician) and Advanced Practice Providers (APPs -  Physician Assistants and Nurse Practitioners) who all work together to provide you with the care you need, when you need it.  We recommend signing up for the patient portal called "MyChart".  Sign up information is provided on this After Visit Summary.  MyChart is used to connect with patients for Virtual Visits (Telemedicine).  Patients are able to view lab/test results, encounter notes, upcoming appointments, etc.  Non-urgent messages can be sent to your provider as well.   To learn more about what you can do with MyChart, go to [email or web address].    Your next appointment:  1 year    Call in Oct to schedule Jan appointment     Provider:  Dr.Jordan

## 2023-05-28 MED ORDER — ACYCLOVIR 200 MG PO CAPS: 400.0000 mg | ORAL_CAPSULE | Freq: Three times a day (TID) | ORAL | 3 refills | Status: DC

## 2023-05-28 NOTE — Telephone Encounter (Signed)
ERx 

## 2023-06-10 ENCOUNTER — Ambulatory Visit: Payer: Commercial Managed Care - HMO | Attending: Cardiology

## 2023-06-10 DIAGNOSIS — I714 Abdominal aortic aneurysm, without rupture, unspecified: Secondary | ICD-10-CM | POA: Diagnosis not present

## 2023-06-10 DIAGNOSIS — I739 Peripheral vascular disease, unspecified: Secondary | ICD-10-CM

## 2023-06-10 DIAGNOSIS — I2510 Atherosclerotic heart disease of native coronary artery without angina pectoris: Secondary | ICD-10-CM

## 2023-06-10 LAB — EXERCISE TOLERANCE TEST
Angina Index: 0
Base ST Depression (mm): 0 mm
Duke Treadmill Score: 9
Estimated workload: 10.1
Exercise duration (min): 9 min
Exercise duration (sec): 0 s
MPHR: 167 {beats}/min
Peak HR: 157 {beats}/min
Percent HR: 94 %
RPE: 16
Rest HR: 64 {beats}/min
ST Depression (mm): 0 mm

## 2023-12-11 ENCOUNTER — Other Ambulatory Visit: Payer: Self-pay | Admitting: Vascular Surgery

## 2023-12-11 DIAGNOSIS — I714 Abdominal aortic aneurysm, without rupture, unspecified: Secondary | ICD-10-CM

## 2023-12-11 DIAGNOSIS — I739 Peripheral vascular disease, unspecified: Secondary | ICD-10-CM

## 2024-01-20 ENCOUNTER — Ambulatory Visit (HOSPITAL_BASED_OUTPATIENT_CLINIC_OR_DEPARTMENT_OTHER)
Admission: RE | Admit: 2024-01-20 | Discharge: 2024-01-20 | Disposition: A | Discharge status: Home / Self Care | Source: Ambulatory Visit | Attending: Vascular Surgery | Admitting: Vascular Surgery

## 2024-01-20 ENCOUNTER — Encounter: Payer: Self-pay | Admitting: Vascular Surgery

## 2024-01-20 ENCOUNTER — Ambulatory Visit: Attending: Vascular Surgery | Admitting: Vascular Surgery

## 2024-01-20 ENCOUNTER — Ambulatory Visit (HOSPITAL_COMMUNITY)
Admission: RE | Admit: 2024-01-20 | Discharge: 2024-01-20 | Disposition: A | Discharge status: Home / Self Care | Source: Ambulatory Visit | Attending: Vascular Surgery | Admitting: Vascular Surgery

## 2024-01-20 VITALS — BP 135/80 | HR 46 | Temp 97.9°F | Ht 67.0 in | Wt 177.0 lb

## 2024-01-20 DIAGNOSIS — I714 Abdominal aortic aneurysm, without rupture, unspecified: Secondary | ICD-10-CM | POA: Diagnosis present

## 2024-01-20 DIAGNOSIS — I739 Peripheral vascular disease, unspecified: Secondary | ICD-10-CM

## 2024-01-20 LAB — VAS US ABI WITH/WO TBI
Left ABI: 1.03
Right ABI: 0.95

## 2024-01-20 NOTE — Progress Notes (Signed)
 Patient ID: Andrew Hart, male   DOB: [birth year], 53 y.o.   MRN: [id number]  Reason for Consult: Follow-up   Referred by Rilla Baller, MD  Subjective:     HPI:  Andrew Hart is a 53 y.o. 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  He also has known history of abdominal aortic aneurysm and his father also had an abdominal aortic aneurysm that was repaired endovascularly.  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  He is now asymptomatic.  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  He does not have any complaints related to today's visit.  Past Medical History:  Diagnosis Date   Alcohol use 11/08/2015   Anxiety    Aortic aneurysm (HCC)    Diabetes mellitus without complication (HCC) 2006   Kumar   GERD (gastroesophageal reflux disease)    Herpes labialis    History of chicken pox    Hyperlipidemia    Kidney stone    Family History  Problem Relation Age of Onset   Diabetes Mother    Parkinsonism Mother 76       progressive supranuclear palsy, deceased   Heart attack Father 62       multiple stents and subsequent CABG   CAD Father 34       MI at 36, CABG at 6   Diabetes Maternal Grandmother    Stroke Neg Hx    Cancer Neg Hx    Colon cancer Neg Hx    Esophageal cancer Neg Hx    Rectal cancer Neg Hx    Stomach cancer Neg Hx    Past Surgical History:  Procedure Laterality Date   ABDOMINAL AORTOGRAM W/LOWER EXTREMITY N/A 07/22/2019   Procedure: ABDOMINAL AORTOGRAM W/LOWER EXTREMITY;  Surgeon: Harvey Carlin BRAVO, MD;  Location: [facility];  Service: Cardiovascular;  Laterality: N/A;   APPENDECTOMY     FINGER SURGERY Right    2 screws in ring finger   PERIPHERAL VASCULAR INTERVENTION Left 07/22/2019   Procedure: PERIPHERAL VASCULAR INTERVENTION;  Surgeon: Harvey Carlin BRAVO, MD;  Location: [facility];  Service: Cardiovascular;   Laterality: Left;   TONSILLECTOMY      Short Social History:  Social History   Tobacco Use   Smoking status: Former    Current packs/day: 0.00    Average packs/day: 0.5 packs/day for 15.0 years (7.5 ttl pk-yrs)    Types: Cigarettes    Start date: 02/03/2004    Quit date: 02/03/2019    Years since quitting: 4.9    Passive exposure: Never   Smokeless tobacco: Never  Substance Use Topics   Alcohol use: Not Currently    Alcohol/week: 0.0 standard drinks of alcohol    Comment: Regular beer (12 pk/wkend)    Allergies  Allergen Reactions   Janumet  Xr [Sitaglip Phos-Metformin  Hcl Er] Rash    Tolerates the plain Januvia     Jardiance  [Empagliflozin ] Rash   Metformin  And Related Diarrhea   Methotrexate Derivatives Other (See Comments)    transaminitis   Other Rash    Methyldibromo Gluteronitrile - skin product preservative    Current Outpatient Medications  Medication Sig Dispense Refill   acetaminophen  (TYLENOL ) 500 MG tablet Take 500 mg by mouth every 6 (six) hours as needed.     acyclovir  (ZOVIRAX ) 200 MG capsule Take 2 capsules (400 mg total) by mouth 3 (three)  times daily. For 5 days as needed for cold sore 30 capsule 3   aspirin  EC 81 MG tablet Take 1 tablet (81 mg total) by mouth daily. Swallow whole.     busPIRone  (BUSPAR ) 15 MG tablet Take 1 tablet (15 mg total) by mouth 2 (two) times daily. 180 tablet 3   Continuous Glucose Sensor (DEXCOM G6 SENSOR) MISC Use to monitor blood sugar, change after 10 days 3 each 3   Continuous Glucose Transmitter (DEXCOM G6 TRANSMITTER) MISC APPLY 1 TRANSMITTER AS DIRECTED     Cyanocobalamin  (B-12) 1000 MCG SUBL Place 1 tablet under the tongue daily.     diclofenac  Sodium (VOLTAREN  ARTHRITIS PAIN) 1 % GEL Apply 2 g topically 4 (four) times daily. 150 g 3   dupilumab (DUPIXENT) 300 MG/2ML prefilled syringe Inject 300 mg into the skin every 14 (fourteen) days.      escitalopram  (LEXAPRO ) 20 MG tablet Take 1 tablet (20 mg total) by mouth daily.  90 tablet 3   halobetasol (ULTRAVATE) 0.05 % cream Apply 1 application topically 2 (two) times daily. Can cover with Saran wrap fo r30 min.  DO NOT apply to face/groin.     hydrOXYzine (ATARAX/VISTARIL) 25 MG tablet Take 25 mg by mouth 3 (three) times daily as needed for itching.  0   Insulin  Pen Needle (PEN NEEDLES) 31G X 8 MM MISC Use to inject insulin  four times daily for 30 days     JARDIANCE  25 MG TABS tablet TAKE 1 TABLET (25 MG TOTAL) BY MOUTH DAILY BEFORE BREAKFAST. 30 tablet 3   losartan  (COZAAR ) 50 MG tablet Take 1 tablet (50 mg total) by mouth daily. 90 tablet 3   pantoprazole  (PROTONIX ) 40 MG tablet Take 1 tablet (40 mg total) by mouth daily. 90 tablet 3   pioglitazone  (ACTOS ) 45 MG tablet Take 1 tablet (45 mg total) by mouth daily. 90 tablet 3   rosuvastatin  (CRESTOR ) 40 MG tablet Take 1 tablet (40 mg total) by mouth daily. 90 tablet 3   traZODone  (DESYREL ) 150 MG tablet TAKE 1/2 TABLET (75 MG TOTAL) BY MOUTH AT BEDTIME. 45 tablet 3   TRESIBA FLEXTOUCH 100 UNIT/ML FlexTouch Pen 30 units Subcutaneous once daily for 90 days     No current facility-administered medications for this visit.    Review of Systems  Constitutional:  Constitutional negative. HENT: HENT negative.  Eyes: Eyes negative.  Respiratory: Respiratory negative.  Cardiovascular: Cardiovascular negative.  GI: Gastrointestinal negative.  Musculoskeletal: Musculoskeletal negative.  Skin: Skin negative.  Neurological: Neurological negative. Hematologic: Hematologic/lymphatic negative.  Psychiatric: Psychiatric negative.        Objective:  Objective   Vitals:   01/20/24 0932  BP: 135/80  Pulse: (!) 46  Temp: 97.9 F (36.6 C)  SpO2: 95%     Physical Exam HENT:     Head: Normocephalic.     Nose: Nose normal.  Eyes:     Pupils: Pupils are equal, round, and reactive to light.  Cardiovascular:     Rate and Rhythm: Normal rate.     Pulses: Normal pulses.  Pulmonary:     Effort: Pulmonary effort is  normal.  Abdominal:     General: Abdomen is flat.  Musculoskeletal:     Cervical back: Normal range of motion and neck supple.     Right lower leg: No edema.     Left lower leg: No edema.  Skin:    General: Skin is warm.     Capillary Refill: Capillary refill takes less  than 2 seconds.  Neurological:     General: No focal deficit present.     Mental Status: He is alert.  Psychiatric:        Mood and Affect: Mood normal.        Thought Content: Thought content normal.     Data: ABI Findings:  +---------+------------------+-----+---------+--------+  Right   Rt Pressure (mmHg)IndexWaveform Comment   +---------+------------------+-----+---------+--------+  Brachial 111                    triphasic          +---------+------------------+-----+---------+--------+  PTA     115               0.95 triphasic          +---------+------------------+-----+---------+--------+  DP      109               0.90 triphasic          +---------+------------------+-----+---------+--------+  Great Toe65                0.54                    +---------+------------------+-----+---------+--------+   +---------+------------------+-----+---------+-------+  Left    Lt Pressure (mmHg)IndexWaveform Comment  +---------+------------------+-----+---------+-------+  Brachial 121                    triphasic         +---------+------------------+-----+---------+-------+  PTA     125               1.03 triphasic         +---------+------------------+-----+---------+-------+  DP      112               0.93 triphasic         +---------+------------------+-----+---------+-------+  Great Toe105               0.87                   +---------+------------------+-----+---------+-------+   +-------+-----------+-----------+------------+------------+  ABI/TBIToday's ABIToday's TBIPrevious ABIPrevious TBI   +-------+-----------+-----------+------------+------------+  Right 0.95       0.54       0.88        0.71          +-------+-----------+-----------+------------+------------+  Left  1.03       0.87       1.03        0.85          +-------+-----------+-----------+------------+------------+      Right ABIs appear increased. Left ABIs and TBIs appear essentially  unchanged.    Summary:  Right: Resting right ankle-brachial index is within normal range. The  right toe-brachial index is abnormal.    Left: Resting left ankle-brachial index is within normal range. The left  toe-brachial index is normal.   Abdominal Aorta Findings:  +-------------+-------+----------+----------+--------+--------+--------+  Location    AP (cm)Trans (cm)PSV (cm/s)WaveformThrombusComments  +-------------+-------+----------+----------+--------+--------+--------+  Proximal    2.10   2.10      68                                  +-------------+-------+----------+----------+--------+--------+--------+  Mid         3.00   3.00      39  fusiform  +-------------+-------+----------+----------+--------+--------+--------+  Distal      3.50   3.30      54                        fusiform  +-------------+-------+----------+----------+--------+--------+--------+  RT CIA Prox  0.8    1.2       363                                 +-------------+-------+----------+----------+--------+--------+--------+  RT CIA Mid                    179                                 +-------------+-------+----------+----------+--------+--------+--------+  RT CIA Distal                 104                                 +-------------+-------+----------+----------+--------+--------+--------+  RT EIA Prox  0.8    0.8       100                                 +-------------+-------+----------+----------+--------+--------+--------+  RT EIA Mid                     75                                  +-------------+-------+----------+----------+--------+--------+--------+  RT EIA Distal                 99                                  +-------------+-------+----------+----------+--------+--------+--------+  LT CIA Prox  1.0    1.0       295                                 +-------------+-------+----------+----------+--------+--------+--------+  LT CIA Mid                    178                                 +-------------+-------+----------+----------+--------+--------+--------+  LT CIA Distal                 131                                 +-------------+-------+----------+----------+--------+--------+--------+  LT EIA Prox  0.8    0.9       100                                 +-------------+-------+----------+----------+--------+--------+--------+  LT EIA Mid  86                                  +-------------+-------+----------+----------+--------+--------+--------+  LT EIA Distal                 106                                 +-------------+-------+----------+----------+--------+--------+--------+     Summary:  Abdominal Aorta: There is evidence of abnormal dilatation of the mid and  distal Abdominal aorta. The largest aortic measurement is 3.5 cm. Left  common iliac arery stent walls are not clearly visualized.  Stenosis: +------------------+-------------+  Location          Stenosis       +------------------+-------------+  Right Common Iliac>50% stenosis  +------------------+-------------+  Left Common Iliac >50% stenosis  +------------------+-------------+         Assessment/Plan:     53 year old male with history as above.  Stent appears patent with palpable distal pulses well-perfused lower extremities that are asymptomatic and stable in small abdominal aortic aneurysm.  Given that he remains asymptomatic for  several years after stent placement with small aneurysm that remained stable in size we can see him in 2 years.  He knows to call if symptoms return prior to the 2-year mark.  All questions were answered he demonstrates good understanding.     Penne Lonni Colorado MD [facility]

## 2024-04-11 ENCOUNTER — Ambulatory Visit: Payer: Self-pay

## 2024-04-11 NOTE — Telephone Encounter (Signed)
 FYI Only or Action Required?: FYI only for provider: appointment scheduled on 04/19/24.  Patient was last seen in primary care on 03/27/2023 by Rilla Baller, MD.  Called Nurse Triage reporting Depression.  Symptoms began several weeks ago.  Interventions attempted: Nothing.  Symptoms are: stable.  Triage Disposition: See Physician Within 24 Hours  Patient/caregiver understands and will follow disposition?: No, wishes to speak with PCP Reason for Disposition  Symptoms interfere with work or school  Answer Assessment - Initial Assessment Questions Patient reports having feelings of depression when he was younger, tried a few anti depressants and worked through the things in life that were going on and stopped taking the medication. 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 His kids are avoiding him, him and his wife are not speaking and likely to get a divorce. Patient adamant on seeing PCP, next available not until 12/16.    Patient also looking to schedule annual physical, next available not until February, patient expressed concerns of not knowing if he'll even have insurance then. Advised patient to schedule appointment anyway, I can add to wait list and he can discuss with PCP next week at mental health visit if he can get in sooner. Patient agreeable.   1. CONCERN: What happened that made you call today?     Feeling mentally and physically depressed   2. DEPRESSION SYMPTOM SCREENING: How are you feeling overall? (e.g., decreased energy, increased sleeping or difficulty sleeping, difficulty concentrating, feelings of sadness, guilt, hopelessness, or worthlessness)     Decreased energy, hopelessness, wants to lay in bed all day. Starts something and then cannot finish it  3. RISK OF HARM - SUICIDAL IDEATION:  Do you ever have thoughts of hurting or killing yourself?  (e.g., yes, no, no but  preoccupation with thoughts about death)     Patient states has thought things would be better if he was not here, but not does have a plan  4. RISK OF HARM - HOMICIDAL IDEATION:  Do you ever have thoughts of hurting or killing someone else?  (e.g., yes, no, no but preoccupation with thoughts about death)     Denies  5. FUNCTIONAL IMPAIRMENT: How have things been going for you overall? Have you had more difficulty than usual doing your normal daily activities?  (e.g., better, same, worse; self-care, school, work, interactions)     Takes Trazodone  for sleep, likes going to sleep and sleeping, but wakes up and feels bad.   6. SUPPORT: Who is with you now? Who do you live with? Do you have family or friends who you can talk to?      Has brother and dad  7. THERAPIST: Do you have a counselor or therapist? If Yes, ask: What is their name?     Denies  8. STRESSORS: Has there been any new stress or recent changes in your life?     Possibly getting divorce with wife  9. ALCOHOL USE OR SUBSTANCE USE (DRUG USE): Do you drink alcohol or use any illegal drugs?     Stopped drinking about a month ago.  10. OTHER: Do you have any other physical symptoms right now? (e.g., fever)       Denies  Protocols used: Depression-A-AH  Copied from CRM #[id number]. Topic: Clinical - Red Word Triage >> Apr 11, 2024 11:59 AM Alfonso ORN wrote: Red Word that prompted transfer to Nurse Triage:  mental health worsening over time since previously discussed with pcp Rilla.

## 2024-04-11 NOTE — Telephone Encounter (Signed)
 Noted. Can we offer appt Wed at 4:30pm for mood f/u visit? Would keep 12/16 appt as CPE

## 2024-04-15 ENCOUNTER — Encounter: Payer: Self-pay | Admitting: Family Medicine

## 2024-04-15 ENCOUNTER — Ambulatory Visit: Admitting: Family Medicine

## 2024-04-15 VITALS — BP 110/70 | HR 52 | Temp 98.0°F | Ht 67.0 in | Wt 180.9 lb

## 2024-04-15 DIAGNOSIS — L281 Prurigo nodularis: Secondary | ICD-10-CM

## 2024-04-15 DIAGNOSIS — I77811 Abdominal aortic ectasia: Secondary | ICD-10-CM

## 2024-04-15 DIAGNOSIS — Z125 Encounter for screening for malignant neoplasm of prostate: Secondary | ICD-10-CM | POA: Diagnosis not present

## 2024-04-15 DIAGNOSIS — Z87898 Personal history of other specified conditions: Secondary | ICD-10-CM

## 2024-04-15 DIAGNOSIS — E1169 Type 2 diabetes mellitus with other specified complication: Secondary | ICD-10-CM

## 2024-04-15 DIAGNOSIS — Z0001 Encounter for general adult medical examination with abnormal findings: Secondary | ICD-10-CM | POA: Diagnosis not present

## 2024-04-15 DIAGNOSIS — Z87891 Personal history of nicotine dependence: Secondary | ICD-10-CM

## 2024-04-15 DIAGNOSIS — R931 Abnormal findings on diagnostic imaging of heart and coronary circulation: Secondary | ICD-10-CM | POA: Diagnosis not present

## 2024-04-15 DIAGNOSIS — F4323 Adjustment disorder with mixed anxiety and depressed mood: Secondary | ICD-10-CM | POA: Diagnosis not present

## 2024-04-15 DIAGNOSIS — Z95828 Presence of other vascular implants and grafts: Secondary | ICD-10-CM

## 2024-04-15 DIAGNOSIS — L209 Atopic dermatitis, unspecified: Secondary | ICD-10-CM | POA: Insufficient documentation

## 2024-04-15 DIAGNOSIS — K219 Gastro-esophageal reflux disease without esophagitis: Secondary | ICD-10-CM

## 2024-04-15 DIAGNOSIS — I10 Essential (primary) hypertension: Secondary | ICD-10-CM

## 2024-04-15 DIAGNOSIS — G4700 Insomnia, unspecified: Secondary | ICD-10-CM

## 2024-04-15 DIAGNOSIS — F4322 Adjustment disorder with anxiety: Secondary | ICD-10-CM | POA: Diagnosis not present

## 2024-04-15 DIAGNOSIS — E785 Hyperlipidemia, unspecified: Secondary | ICD-10-CM

## 2024-04-15 LAB — COMPREHENSIVE METABOLIC PANEL WITH GFR
ALT: 18 U/L (ref 0–53)
AST: 17 U/L (ref 0–37)
Albumin: 4.4 g/dL (ref 3.5–5.2)
Alkaline Phosphatase: 68 U/L (ref 39–117)
BUN: 17 mg/dL (ref 6–23)
CO2: 30 meq/L (ref 19–32)
Calcium: 9.2 mg/dL (ref 8.4–10.5)
Chloride: 102 meq/L (ref 96–112)
Creatinine, Ser: 0.99 mg/dL (ref 0.40–1.50)
GFR: 86.55 mL/min (ref >60.00)
Glucose, Bld: 127 mg/dL — ABNORMAL HIGH (ref 70–99)
Potassium: 4.7 meq/L (ref 3.5–5.1)
Sodium: 141 meq/L (ref 135–145)
Total Bilirubin: 0.7 mg/dL (ref 0.2–1.2)
Total Protein: 6.3 g/dL (ref 6.0–8.3)

## 2024-04-15 LAB — LIPID PANEL
Cholesterol: 117 mg/dL (ref 0–200)
HDL: 47 mg/dL (ref >39.00)
LDL Cholesterol: 59 mg/dL (ref 0–99)
NonHDL: 69.77
Total CHOL/HDL Ratio: 2
Triglycerides: 56 mg/dL (ref 0.0–149.0)
VLDL: 11.2 mg/dL (ref 0.0–40.0)

## 2024-04-15 LAB — URINALYSIS, ROUTINE W REFLEX MICROSCOPIC
Bilirubin Urine: NEGATIVE
Hgb urine dipstick: NEGATIVE
Ketones, ur: NEGATIVE
Leukocytes,Ua: NEGATIVE
Nitrite: NEGATIVE
RBC / HPF: NONE SEEN (ref 0–?)
Specific Gravity, Urine: 1.02 (ref 1.000–1.030)
Total Protein, Urine: NEGATIVE
Urine Glucose: >=1000 — AB
Urobilinogen, UA: 0.2 (ref 0.0–1.0)
pH: 6 (ref 5.0–8.0)

## 2024-04-15 LAB — MICROALBUMIN / CREATININE URINE RATIO
Creatinine,U: 93.8 mg/dL
Microalb Creat Ratio: UNDETERMINED mg/g (ref 0.0–30.0)
Microalb, Ur: <0.7 mg/dL

## 2024-04-15 LAB — PSA: PSA: 1.22 ng/mL (ref 0.10–4.00)

## 2024-04-15 LAB — VITAMIN B12: Vitamin B-12: 250 pg/mL (ref 211–911)

## 2024-04-15 LAB — HEMOGLOBIN A1C: Hgb A1c MFr Bld: 8.1 % — ABNORMAL HIGH (ref 4.6–6.5)

## 2024-04-15 MED ORDER — ESCITALOPRAM OXALATE 20 MG PO TABS: 20.0000 mg | ORAL_TABLET | Freq: Every day | ORAL | 3 refills | Status: AC

## 2024-04-15 MED ORDER — PANTOPRAZOLE SODIUM 40 MG PO TBEC: 40.0000 mg | DELAYED_RELEASE_TABLET | Freq: Every day | ORAL | 3 refills | Status: AC

## 2024-04-15 MED ORDER — TRAZODONE HCL 150 MG PO TABS: ORAL_TABLET | ORAL | 3 refills | Status: AC

## 2024-04-15 MED ORDER — ROSUVASTATIN CALCIUM 40 MG PO TABS: 40.0000 mg | ORAL_TABLET | Freq: Every day | ORAL | 3 refills | Status: AC

## 2024-04-15 MED ORDER — BUPROPION HCL ER (SR) 100 MG PO TB12: 100.0000 mg | ORAL_TABLET | Freq: Two times a day (BID) | ORAL | 3 refills | Status: DC

## 2024-04-15 MED ORDER — ACYCLOVIR 200 MG PO CAPS: 400.0000 mg | ORAL_CAPSULE | Freq: Three times a day (TID) | ORAL | 3 refills | Status: AC

## 2024-04-15 MED ORDER — LOSARTAN POTASSIUM 50 MG PO TABS: 50.0000 mg | ORAL_TABLET | Freq: Every day | ORAL | 3 refills | Status: AC

## 2024-04-15 MED ORDER — BUSPIRONE HCL 15 MG PO TABS: 15.0000 mg | ORAL_TABLET | Freq: Two times a day (BID) | ORAL | 3 refills | Status: DC

## 2024-04-15 NOTE — Assessment & Plan Note (Signed)
This is followed by [facility] ?

## 2024-04-15 NOTE — Assessment & Plan Note (Signed)
 Continue dupixent through dermatology.

## 2024-04-15 NOTE — Assessment & Plan Note (Signed)
 Chronic, followed by [facility] latest appt 02/2024.

## 2024-04-15 NOTE — Assessment & Plan Note (Signed)
 Remains tobacco free - he is vaping however

## 2024-04-15 NOTE — Assessment & Plan Note (Signed)
 Chronic, on trazodone  75mg  nightly

## 2024-04-15 NOTE — Progress Notes (Signed)
 Ph: [phone] Fax: [phone]   Patient ID: Andrew Hart, male    DOB: 05/15/1970, 53 y.o.   MRN: [id number]  This visit was conducted in person.  BP 110/70 (Cuff Size: Normal)   Pulse (!) 52   Temp 98 F (36.7 C) (Oral)   Ht 5' 7 (1.702 m)   Wt 180 lb 14.4 oz (82.1 kg)   SpO2 96%   BMI 28.33 kg/m    CC: CPE Subjective:   HPI: Andrew Hart is a 53 y.o. male presenting on 04/15/2024 for Annual Exam (Pt has houghts of hurting himself, no plan/Going through a divorce, Feels like family has turned against him//Pt states it helps him to stay busy, and he has started vaping//Not sure if taking lexapro  or not)   CAD with CCS 822 (99%) - saw cardiology Dr Andrew Hart s/p ETT showing no ischemia. Now on high dose Crestor  40mg  daily with goal LDL <55.   DM - previously saw [facility] - established with [facility] Dr Andrew Hart. Currently on Tresiba 25u daily, jardiance  25mg  daily, actos  45mg  every other day. Most recently started on FL3+ CGM 02/2024. A1c at that time 8.3%.   PAD s/p L CIA stent 07/2019 followed yearly by [facility], last seen 01/2024.  Also monitoring AAA (latest 3.6cm) and R CIA stenosis as pt asxs from claudication standpoint.   Worsening depression in setting of going through divorce (married 84yrs), still lives in same house as wife - stressful period. Feels family has turned against him. He can speak with his father and brother. Passive SI but no active SI/HI or plan. Contracts for safety.  He has contact info for counselor.  Anhedonia, increased sleep, decreased appetite.  Previously on buspar  15mg  bid, escitalopram  20mg  daily.  He takes trazodone  150mg  nightly 1/2 tab for sleep.   Continues dupixent for atopic dermatitis with benefit.   Preventative: Colonoscopy 02/2020 - WNL rpt 10 yrs (Andrew Hart)  Prostate cancer screening - yearly PSA Lung cancer screening - quit 2020 ~17 PY hx Flu shot yearly COVID vaccine - did not receive  Pneumovax 2016 Tdap 2014 Shingrix -  discussed, decline Seat belt use discussed Sunscreen use discussed. No changing moles on skin. Sees derm yearly.  Sleep - averaging 8 hours/night Ex-smoker - 1/2 ppd x 34 yrs. Quit 04/2019, he is vaping.  Alcohol - none  Dentist Q6 mo  Eye exam - yearly - saw retina specialist  Lives with wife and 2 children (daughter 2003, son 70)  Occ: owns education officer, environmental business  Activity: active at work, at home on yard  Diet: some water, fruits/vegetables daily, lots of soda and gatorade      Relevant past medical, surgical, family and social history reviewed and updated as indicated. Interim medical history since our last visit reviewed. Allergies and medications reviewed and updated. Outpatient Medications Prior to Visit  Medication Sig Dispense Refill   diclofenac  Sodium (VOLTAREN  ARTHRITIS PAIN) 1 % GEL Apply 2 g topically 4 (four) times daily. 150 g 3   dupilumab (DUPIXENT) 300 MG/2ML prefilled syringe Inject 300 mg into the skin every 14 (fourteen) days.      halobetasol (ULTRAVATE) 0.05 % cream Apply 1 application topically 2 (two) times daily. Can cover with Saran wrap fo r30 min.  DO NOT apply to face/groin.     hydrOXYzine (ATARAX/VISTARIL) 25 MG tablet Take 25 mg by mouth 3 (three) times daily as needed for itching.  0   Insulin  Pen Needle (PEN NEEDLES) 31G X  8 MM MISC Use to inject insulin  four times daily for 30 days     JARDIANCE  25 MG TABS tablet TAKE 1 TABLET (25 MG TOTAL) BY MOUTH DAILY BEFORE BREAKFAST. 30 tablet 3   Continuous Glucose Transmitter (DEXCOM G6 TRANSMITTER) MISC APPLY 1 TRANSMITTER AS DIRECTED     Cyanocobalamin  (B-12) 1000 MCG SUBL Place 1 tablet under the tongue daily.     losartan  (COZAAR ) 50 MG tablet Take 1 tablet (50 mg total) by mouth daily. 90 tablet 3   pantoprazole  (PROTONIX ) 40 MG tablet Take 1 tablet (40 mg total) by mouth daily. 90 tablet 3   pioglitazone  (ACTOS ) 45 MG tablet Take 1 tablet (45 mg total) by mouth daily. (Patient taking differently: Take 45  mg by mouth every other day.) 90 tablet 3   rosuvastatin  (CRESTOR ) 40 MG tablet Take 1 tablet (40 mg total) by mouth daily. 90 tablet 3   traZODone  (DESYREL ) 150 MG tablet TAKE 1/2 TABLET (75 MG TOTAL) BY MOUTH AT BEDTIME. 45 tablet 3   TRESIBA FLEXTOUCH 100 UNIT/ML FlexTouch Pen 30 units Subcutaneous once daily for 90 days     acetaminophen  (TYLENOL ) 500 MG tablet Take 500 mg by mouth every 6 (six) hours as needed.     acyclovir  ointment (ZOVIRAX ) 5 % Apply 1 Application topically as needed.     aspirin  EC 81 MG tablet Take 1 tablet (81 mg total) by mouth daily. Swallow whole.     Cyanocobalamin  (B-12) 1000 MCG SUBL Place 1 tablet under the tongue every Monday, Wednesday, and Friday.     pioglitazone  (ACTOS ) 45 MG tablet Take 1 tablet (45 mg total) by mouth every other day.     TRESIBA FLEXTOUCH 100 UNIT/ML FlexTouch Pen Inject 25 Units into the skin daily.     acyclovir  (ZOVIRAX ) 200 MG capsule Take 2 capsules (400 mg total) by mouth 3 (three) times daily. For 5 days as needed for cold sore 30 capsule 3   busPIRone  (BUSPAR ) 15 MG tablet Take 1 tablet (15 mg total) by mouth 2 (two) times daily. 180 tablet 3   Continuous Glucose Sensor (DEXCOM G6 SENSOR) MISC Use to monitor blood sugar, change after 10 days 3 each 3   escitalopram  (LEXAPRO ) 20 MG tablet Take 1 tablet (20 mg total) by mouth daily. 90 tablet 3   No facility-administered medications prior to visit.     Per HPI unless specifically indicated in ROS section below Review of Systems  Constitutional:  Negative for activity change, appetite change, chills, fatigue, fever and unexpected weight change.  HENT:  Negative for hearing loss.   Eyes:  Negative for visual disturbance.  Respiratory:  Negative for cough, chest tightness, shortness of breath and wheezing.   Cardiovascular:  Negative for chest pain, palpitations and leg swelling.  Gastrointestinal:  Negative for abdominal distention, abdominal pain, blood in stool, constipation,  diarrhea, nausea and vomiting.  Genitourinary:  Negative for difficulty urinating and hematuria.  Musculoskeletal:  Negative for arthralgias, myalgias and neck pain.  Skin:  Negative for rash.  Neurological:  Positive for headaches (occ). Negative for dizziness, seizures and syncope.  Hematological:  Negative for adenopathy. Does not bruise/bleed easily.  Psychiatric/Behavioral:  Positive for dysphoric mood. The patient is nervous/anxious.     Objective:  BP 110/70 (Cuff Size: Normal)   Pulse (!) 52   Temp 98 F (36.7 C) (Oral)   Ht 5' 7 (1.702 m)   Wt 180 lb 14.4 oz (82.1 kg)   SpO2 96%  BMI 28.33 kg/m   Wt Readings from Last 3 Encounters:  04/15/24 180 lb 14.4 oz (82.1 kg)  01/20/24 177 lb (80.3 kg)  05/26/23 183 lb (83 kg)      Physical Exam Vitals and nursing note reviewed.  Constitutional:      General: He is not in acute distress.    Appearance: Normal appearance. He is well-developed. He is not ill-appearing.  HENT:     Head: Normocephalic and atraumatic.     Right Ear: Hearing, tympanic membrane, ear canal and external ear normal.     Left Ear: Hearing, tympanic membrane, ear canal and external ear normal.     Mouth/Throat:     Mouth: Mucous membranes are moist.     Pharynx: Oropharynx is clear. No oropharyngeal exudate or posterior oropharyngeal erythema.  Eyes:     General: No scleral icterus.    Extraocular Movements: Extraocular movements intact.     Conjunctiva/sclera: Conjunctivae normal.     Pupils: Pupils are equal, round, and reactive to light.  Neck:     Thyroid : No thyroid  mass or thyromegaly.  Cardiovascular:     Rate and Rhythm: Normal rate and regular rhythm.     Pulses: Normal pulses.          Radial pulses are 2+ on the right side and 2+ on the left side.     Heart sounds: Normal heart sounds. No murmur heard. Pulmonary:     Effort: Pulmonary effort is normal. No respiratory distress.     Breath sounds: Normal breath sounds. No wheezing,  rhonchi or rales.  Abdominal:     General: Bowel sounds are normal. There is no distension.     Palpations: Abdomen is soft. There is no mass.     Tenderness: There is no abdominal tenderness. There is no guarding or rebound.     Hernia: No hernia is present.  Musculoskeletal:        General: Normal range of motion.     Cervical back: Normal range of motion and neck supple.     Right lower leg: No edema.     Left lower leg: No edema.  Lymphadenopathy:     Cervical: No cervical adenopathy.  Skin:    General: Skin is warm and dry.     Findings: No rash.  Neurological:     General: No focal deficit present.     Mental Status: He is alert and oriented to person, place, and time.  Psychiatric:        Mood and Affect: Mood normal.        Behavior: Behavior normal.        Thought Content: Thought content normal.        Judgment: Judgment normal.       Results for orders placed or performed during the hospital encounter of 01/20/24  VAS US  ABI WITH/WO TBI   Collection Time: 01/20/24  8:43 AM  Result Value Ref Range   Right ABI 0.95    Left ABI 1.03       04/15/2024   12:18 PM 03/27/2023   11:25 AM 12/18/2022   10:09 AM 09/16/2022   10:36 AM 07/10/2022   11:15 AM  Depression screen PHQ 2/9  Decreased Interest 2 2 0 0 0  Down, Depressed, Hopeless 2 1 0 0 0  PHQ - 2 Score 4 3 0 0 0  Altered sleeping 3 1     Tired, decreased energy 3 2  Change in appetite 2 1     Feeling bad or failure about yourself  2 1     Trouble concentrating 2 1     Moving slowly or fidgety/restless 1 0     Suicidal thoughts 1 0     PHQ-9 Score 18 9      Difficult doing work/chores Somewhat difficult Somewhat difficult        Data saved with a previous flowsheet row definition       04/15/2024   12:19 PM 03/27/2023   11:26 AM 03/24/2022   10:54 AM 03/20/2021    9:38 AM  GAD 7 : Generalized Anxiety Score  Nervous, Anxious, on Edge 3 2 1 2   Control/stop worrying 2 1 0 0  Worry too much -  different things 2 1 0 1  Trouble relaxing 2 1 1 1   Restless 1 0 0 0  Easily annoyed or irritable 2 2 1 2   Afraid - awful might happen 3 2 1 1   Total GAD 7 Score 15 9 4 7   Anxiety Difficulty Very difficult Somewhat difficult     Assessment & Plan:   Problem List Items Addressed This Visit     Encounter for general adult medical examination with abnormal findings - Primary (Chronic)   Preventative protocols reviewed and updated unless pt declined. Discussed healthy diet and lifestyle.       Type 2 diabetes mellitus with other specified complication (HCC)   Chronic, followed by [facility] latest appt 02/2024.       Relevant Medications   pioglitazone  (ACTOS ) 45 MG tablet   TRESIBA FLEXTOUCH 100 UNIT/ML FlexTouch Pen   losartan  (COZAAR ) 50 MG tablet   rosuvastatin  (CRESTOR ) 40 MG tablet   Other Relevant Orders   Hemoglobin A1c   Microalbumin / creatinine urine ratio   Vitamin B12   Urinalysis, Routine w reflex microscopic   Hyperlipidemia associated with type 2 diabetes mellitus (HCC)   Chronic, on crestor  since last year - update FLP. The ASCVD Risk score (Arnett DK, et al., 2019) failed to calculate for the following reasons:   The valid total cholesterol range is 130 to 320 mg/dL       Relevant Medications   pioglitazone  (ACTOS ) 45 MG tablet   TRESIBA FLEXTOUCH 100 UNIT/ML FlexTouch Pen   losartan  (COZAAR ) 50 MG tablet   rosuvastatin  (CRESTOR ) 40 MG tablet   Other Relevant Orders   Lipid panel   Comprehensive metabolic panel with GFR   Insomnia   Chronic, on trazodone  75mg  nightly      Relevant Medications   traZODone  (DESYREL ) 150 MG tablet   Adjustment disorder with mixed anxiety and depressed mood   Chronic, deteriorated in setting of marital stressors with associated financial stressors and possible divorce.  Contracts for safety.  Support provided.  Already on lexapro  20mg  daily, buspar  15mg  bid, trazodone  75mg  nightly. Add wellbutrin SR 100mg  daily.   Offered psychiatry eval - he's hesitant due to cost. Refer to counselor.       Relevant Medications   busPIRone  (BUSPAR ) 15 MG tablet   escitalopram  (LEXAPRO ) 20 MG tablet   Other Relevant Orders   Ambulatory referral to Psychology   GERD (gastroesophageal reflux disease)   Continue daily PPI       Relevant Medications   pantoprazole  (PROTONIX ) 40 MG tablet   History of alcohol use   Remains abstinent.       Ex-smoker   Remains tobacco free - he is vaping however  Prurigo nodularis   Doing well with dupixent through derm.      Abdominal aortic ectasia   This is followed by [facility].       Relevant Medications   losartan  (COZAAR ) 50 MG tablet   rosuvastatin  (CRESTOR ) 40 MG tablet   S/P insertion of iliac artery stent   Primary hypertension   Chronic, stable. Continue current regimen.       Relevant Medications   losartan  (COZAAR ) 50 MG tablet   rosuvastatin  (CRESTOR ) 40 MG tablet   Agatston CAC score, >400   Continue aspirin , crestor .  Saw cardiology  with reassuring ETT 06/2023.       Relevant Medications   losartan  (COZAAR ) 50 MG tablet   rosuvastatin  (CRESTOR ) 40 MG tablet   Atopic dermatitis   Continue dupixent through dermatology.       Other Visit Diagnoses       Special screening for malignant neoplasm of prostate       Relevant Orders   PSA     Adjustment disorder with anxiety       Relevant Medications   busPIRone  (BUSPAR ) 15 MG tablet   escitalopram  (LEXAPRO ) 20 MG tablet        Meds ordered this encounter  Medications   buPROPion ER (WELLBUTRIN SR) 100 MG 12 hr tablet    Sig: Take 1 tablet (100 mg total) by mouth 2 (two) times daily.    Dispense:  30 tablet    Refill:  3    In addition to lexapro  and buspar    acyclovir  (ZOVIRAX ) 200 MG capsule    Sig: Take 2 capsules (400 mg total) by mouth 3 (three) times daily. For 5 days as needed for cold sore    Dispense:  30 capsule    Refill:  3   busPIRone  (BUSPAR ) 15 MG tablet    Sig:  Take 1 tablet (15 mg total) by mouth 2 (two) times daily.    Dispense:  180 tablet    Refill:  3   escitalopram  (LEXAPRO ) 20 MG tablet    Sig: Take 1 tablet (20 mg total) by mouth daily.    Dispense:  90 tablet    Refill:  3   losartan  (COZAAR ) 50 MG tablet    Sig: Take 1 tablet (50 mg total) by mouth daily.    Dispense:  90 tablet    Refill:  3   pantoprazole  (PROTONIX ) 40 MG tablet    Sig: Take 1 tablet (40 mg total) by mouth daily.    Dispense:  90 tablet    Refill:  3   rosuvastatin  (CRESTOR ) 40 MG tablet    Sig: Take 1 tablet (40 mg total) by mouth daily.    Dispense:  90 tablet    Refill:  3   traZODone  (DESYREL ) 150 MG tablet    Sig: TAKE 1/2 TABLET (75 MG TOTAL) BY MOUTH AT BEDTIME.    Dispense:  45 tablet    Refill:  3    Orders Placed This Encounter  Procedures   Hemoglobin A1c   Microalbumin / creatinine urine ratio   Lipid panel   Comprehensive metabolic panel with GFR   PSA   Vitamin B12   Urinalysis, Routine w reflex microscopic   Ambulatory referral to Psychology    Referral Priority:   Routine    Referral Type:   Psychiatric    Referral Reason:   Specialty Services Required    Requested Specialty:   Psychology    Number  of Visits Requested:   1    Patient Instructions  Labs and urine test today  Check on records of flu shot Consider shingles shot, tetanus shot, pneumonia shot.  Start wellbutrin SR 100mg  daily in addition to other mood medications.  I will refer you to our counselor. Call [phone] to schedule appointment.  Cancel next week's appointment, reschedule for 1 month follow up visit  Good to see you today   Follow up plan: Return in about 1 month (around 05/16/2024) for follow up visit.  Anton Blas, MD

## 2024-04-15 NOTE — Assessment & Plan Note (Signed)
 Chronic, stable. Continue current regimen.

## 2024-04-15 NOTE — Assessment & Plan Note (Signed)
 Chronic, on crestor  since last year - update FLP. The ASCVD Risk score (Arnett DK, et al., 2019) failed to calculate for the following reasons:   The valid total cholesterol range is 130 to 320 mg/dL

## 2024-04-15 NOTE — Assessment & Plan Note (Signed)
 Remains abstinent

## 2024-04-15 NOTE — Assessment & Plan Note (Signed)
 Chronic, deteriorated in setting of marital stressors with associated financial stressors and possible divorce.  Contracts for safety.  Support provided.  Already on lexapro  20mg  daily, buspar  15mg  bid, trazodone  75mg  nightly. Add wellbutrin SR 100mg  daily.  Offered psychiatry eval - he's hesitant due to cost. Refer to counselor.

## 2024-04-15 NOTE — Assessment & Plan Note (Addendum)
 Continue aspirin , crestor .  Saw cardiology  with reassuring ETT 06/2023.

## 2024-04-15 NOTE — Patient Instructions (Addendum)
 Labs and urine test today  Check on records of flu shot Consider shingles shot, tetanus shot, pneumonia shot.  Start wellbutrin SR 100mg  daily in addition to other mood medications.  I will refer you to our counselor. Call [phone] to schedule appointment.  Cancel next week's appointment, reschedule for 1 month follow up visit  Good to see you today

## 2024-04-15 NOTE — Assessment & Plan Note (Signed)
 Continue daily PPI.

## 2024-04-15 NOTE — Assessment & Plan Note (Signed)
 Preventative protocols reviewed and updated unless pt declined. Discussed healthy diet and lifestyle.

## 2024-04-15 NOTE — Assessment & Plan Note (Signed)
 Doing well with dupixent through derm.

## 2024-04-18 ENCOUNTER — Ambulatory Visit: Payer: Self-pay | Admitting: Family Medicine

## 2024-04-18 MED ORDER — B-12 1000 MCG SL SUBL: 1.0000 | SUBLINGUAL_TABLET | Freq: Every day | SUBLINGUAL | Status: AC

## 2024-04-19 ENCOUNTER — Ambulatory Visit: Admitting: Family Medicine

## 2024-05-12 ENCOUNTER — Encounter: Payer: Self-pay | Admitting: Cardiology

## 2024-05-16 ENCOUNTER — Encounter: Payer: Self-pay | Admitting: Family Medicine

## 2024-05-16 ENCOUNTER — Ambulatory Visit: Admitting: Family Medicine

## 2024-05-16 VITALS — BP 120/70 | HR 68 | Temp 98.1°F | Ht 67.0 in | Wt 181.2 lb

## 2024-05-16 DIAGNOSIS — F4323 Adjustment disorder with mixed anxiety and depressed mood: Secondary | ICD-10-CM

## 2024-05-16 DIAGNOSIS — G4700 Insomnia, unspecified: Secondary | ICD-10-CM | POA: Diagnosis not present

## 2024-05-16 DIAGNOSIS — Z23 Encounter for immunization: Secondary | ICD-10-CM

## 2024-05-16 MED ORDER — BUPROPION HCL ER (SR) 150 MG PO TB12: 150.0000 mg | ORAL_TABLET | Freq: Every day | ORAL | 3 refills | Status: AC

## 2024-05-16 NOTE — Assessment & Plan Note (Addendum)
 Continue trazodone  75 mg nightly

## 2024-05-16 NOTE — Progress Notes (Signed)
 " Ph: [phone] Fax: [phone]   Patient ID: Andrew Hart, male    DOB: 1970/09/25, 54 y.o.   MRN: [id number]  This visit was conducted in person.  BP 120/70 (BP Location: Left Arm, Patient Position: Sitting, Cuff Size: Normal)   Pulse 68   Temp 98.1 F (36.7 C) (Oral)   Ht 5' 7 (1.702 m)   Wt 181 lb 3.2 oz (82.2 kg)   SpO2 98%   BMI 28.38 kg/m    CC: 1 mo f/u visit  Subjective:   HPI: Andrew Hart is a 54 y.o. male presenting on 05/16/2024 for Medical Management of Chronic Issues (No improvement from last time, pt states he feels  blah//Therapy appt is on 05/23/24/Would like to discuss med refills)   See prior note for details  Going through divorce - he has moved out and is now living with alone in [place] - has friend as network engineer.  He is not  Significant family, work, surveyor, quantity strains.   Passive SI but no active SI/HI or plan. Contracts for safety.   Pending first counseling appt 04/22/2024 with Elvie in [place].   He was already on escitalopram  20mg  daily, buspar  15mg  bid, trazodone  75mg  nightly (1/2 of 150mg  tablet). Last visit we added welbutrin SR 100mg  daily.   Saw endocrinology this morning - A1c down to 7.3%.      Relevant past medical, surgical, family and social history reviewed and updated as indicated. Interim medical history since our last visit reviewed. Allergies and medications reviewed and updated. Outpatient Medications Prior to Visit  Medication Sig Dispense Refill   acetaminophen  (TYLENOL ) 500 MG tablet Take 500 mg by mouth every 6 (six) hours as needed.     acyclovir  (ZOVIRAX ) 200 MG capsule Take 2 capsules (400 mg total) by mouth 3 (three) times daily. For 5 days as needed for cold sore 30 capsule 3   acyclovir  ointment (ZOVIRAX ) 5 % Apply 1 Application topically as needed.     aspirin  EC 81 MG tablet Take 1 tablet (81 mg total) by mouth daily. Swallow whole.     busPIRone  (BUSPAR ) 15 MG tablet Take 1 tablet (15 mg  total) by mouth 2 (two) times daily. 180 tablet 3   Cyanocobalamin  (B-12) 1000 MCG SUBL Place 1 tablet under the tongue daily.     diclofenac  Sodium (VOLTAREN  ARTHRITIS PAIN) 1 % GEL Apply 2 g topically 4 (four) times daily. 150 g 3   dupilumab (DUPIXENT) 300 MG/2ML prefilled syringe Inject 300 mg into the skin every 14 (fourteen) days.      escitalopram  (LEXAPRO ) 20 MG tablet Take 1 tablet (20 mg total) by mouth daily. 90 tablet 3   halobetasol (ULTRAVATE) 0.05 % cream Apply 1 application topically 2 (two) times daily. Can cover with Saran wrap fo r30 min.  DO NOT apply to face/groin.     hydrOXYzine (ATARAX/VISTARIL) 25 MG tablet Take 25 mg by mouth 3 (three) times daily as needed for itching.  0   Insulin  Pen Needle (PEN NEEDLES) 31G X 8 MM MISC Use to inject insulin  four times daily for 30 days     JARDIANCE  25 MG TABS tablet TAKE 1 TABLET (25 MG TOTAL) BY MOUTH DAILY BEFORE BREAKFAST. 30 tablet 3   losartan  (COZAAR ) 50 MG tablet Take 1 tablet (50 mg total) by mouth daily. 90 tablet 3   pantoprazole  (PROTONIX ) 40 MG tablet Take 1 tablet (40 mg total) by mouth daily. 90 tablet 3  pioglitazone  (ACTOS ) 45 MG tablet Take 1 tablet (45 mg total) by mouth every other day.     rosuvastatin  (CRESTOR ) 40 MG tablet Take 1 tablet (40 mg total) by mouth daily. 90 tablet 3   traZODone  (DESYREL ) 150 MG tablet TAKE 1/2 TABLET (75 MG TOTAL) BY MOUTH AT BEDTIME. 45 tablet 3   TRESIBA FLEXTOUCH 100 UNIT/ML FlexTouch Pen Inject 25 Units into the skin daily.     buPROPion  ER (WELLBUTRIN  SR) 100 MG 12 hr tablet Take 1 tablet (100 mg total) by mouth 2 (two) times daily. (Patient taking differently: Take 100 mg by mouth daily.) 30 tablet 3   No facility-administered medications prior to visit.     Per HPI unless specifically indicated in ROS section below Review of Systems  Objective:  BP 120/70 (BP Location: Left Arm, Patient Position: Sitting, Cuff Size: Normal)   Pulse 68   Temp 98.1 F (36.7 C) (Oral)    Ht 5' 7 (1.702 m)   Wt 181 lb 3.2 oz (82.2 kg)   SpO2 98%   BMI 28.38 kg/m   Wt Readings from Last 3 Encounters:  05/16/24 181 lb 3.2 oz (82.2 kg)  04/15/24 180 lb 14.4 oz (82.1 kg)  01/20/24 177 lb (80.3 kg)      Physical Exam Vitals and nursing note reviewed.  Constitutional:      Appearance: Normal appearance. He is not ill-appearing.  HENT:     Head: Normocephalic and atraumatic.     Mouth/Throat:     Mouth: Mucous membranes are moist.     Pharynx: Oropharynx is clear. No oropharyngeal exudate or posterior oropharyngeal erythema.  Eyes:     Extraocular Movements: Extraocular movements intact.     Pupils: Pupils are equal, round, and reactive to light.  Cardiovascular:     Rate and Rhythm: Normal rate and regular rhythm.     Pulses: Normal pulses.     Heart sounds: Normal heart sounds. No murmur heard. Pulmonary:     Effort: Pulmonary effort is normal. No respiratory distress.     Breath sounds: Normal breath sounds. No wheezing, rhonchi or rales.  Musculoskeletal:     Right lower leg: No edema.     Left lower leg: No edema.  Skin:    General: Skin is warm and dry.  Neurological:     Mental Status: He is alert.  Psychiatric:        Mood and Affect: Mood normal.        Behavior: Behavior normal.       Results for orders placed or performed in visit on 04/15/24  Hemoglobin A1c   Collection Time: 04/15/24 12:53 PM  Result Value Ref Range   Hgb A1c MFr Bld 8.1 (H) 4.6 - 6.5 %  Microalbumin / creatinine urine ratio   Collection Time: 04/15/24 12:53 PM  Result Value Ref Range   Microalb, Ur <0.7 mg/dL   Creatinine,U 06.1 mg/dL   Microalb Creat Ratio Unable to calculate 0.0 - 30.0 mg/g  Lipid panel   Collection Time: 04/15/24 12:53 PM  Result Value Ref Range   Cholesterol 117 0 - 200 mg/dL   Triglycerides 43.9 0.0 - 149.0 mg/dL   HDL 52.99 >60.99 mg/dL   VLDL 88.7 0.0 - 59.9 mg/dL   LDL Cholesterol 59 0 - 99 mg/dL   Total CHOL/HDL Ratio 2    NonHDL 69.77    Comprehensive metabolic panel with GFR   Collection Time: 04/15/24 12:53 PM  Result Value Ref Range  Sodium 141 135 - 145 mEq/L   Potassium 4.7 3.5 - 5.1 mEq/L   Chloride 102 96 - 112 mEq/L   CO2 30 19 - 32 mEq/L   Glucose, Bld 127 (H) 70 - 99 mg/dL   BUN 17 6 - 23 mg/dL   Creatinine, Ser 9.00 0.40 - 1.50 mg/dL   Total Bilirubin 0.7 0.2 - 1.2 mg/dL   Alkaline Phosphatase 68 39 - 117 U/L   AST 17 0 - 37 U/L   ALT 18 0 - 53 U/L   Total Protein 6.3 6.0 - 8.3 g/dL   Albumin 4.4 3.5 - 5.2 g/dL   GFR 13.44 >39.99 mL/min   Calcium  9.2 8.4 - 10.5 mg/dL  PSA   Collection Time: 04/15/24 12:53 PM  Result Value Ref Range   PSA 1.22 0.10 - 4.00 ng/mL  Vitamin B12   Collection Time: 04/15/24 12:53 PM  Result Value Ref Range   Vitamin B-12 250 211 - 911 pg/mL  Urinalysis, Routine w reflex microscopic   Collection Time: 04/15/24 12:53 PM  Result Value Ref Range   Color, Urine YELLOW Yellow;Lt. Yellow;Straw;Dark Yellow;Amber;Green;Red;Brown   APPearance CLEAR Clear;Turbid;Slightly Cloudy;Cloudy   Specific Gravity, Urine 1.020 1.000 - 1.030   pH 6.0 5.0 - 8.0   Total Protein, Urine NEGATIVE Negative   Urine Glucose >=1000 (A) Negative   Ketones, ur NEGATIVE Negative   Bilirubin Urine NEGATIVE Negative   Hgb urine dipstick NEGATIVE Negative   Urobilinogen, UA 0.2 0.0 - 1.0   Leukocytes,Ua NEGATIVE Negative   Nitrite NEGATIVE Negative   WBC, UA 0-2/hpf 0-2/hpf   RBC / HPF none seen 0-2/hpf      05/16/2024    2:37 PM 04/15/2024   12:18 PM 03/27/2023   11:25 AM 12/18/2022   10:09 AM 09/16/2022   10:36 AM  Depression screen PHQ 2/9  Decreased Interest 2 2 2  0 0  Down, Depressed, Hopeless 2 2 1  0 0  PHQ - 2 Score 4 4 3  0 0  Altered sleeping 2 3 1     Tired, decreased energy 2 3 2     Change in appetite 2 2 1     Feeling bad or failure about yourself  3 2 1     Trouble concentrating 2 2 1     Moving slowly or fidgety/restless 2 1 0    Suicidal thoughts 1 1 0    PHQ-9 Score 18 18 9       Difficult doing work/chores Somewhat difficult Somewhat difficult Somewhat difficult       Data saved with a previous flowsheet row definition       05/16/2024    2:38 PM 04/15/2024   12:19 PM 03/27/2023   11:26 AM 03/24/2022   10:54 AM  GAD 7 : Generalized Anxiety Score  Nervous, Anxious, on Edge 3 3 2 1   Control/stop worrying 2 2 1  0  Worry too much - different things 2 2 1  0  Trouble relaxing 2 2 1 1   Restless 2 1 0 0  Easily annoyed or irritable 2 2 2 1   Afraid - awful might happen 3 3 2 1   Total GAD 7 Score 16 15 9 4   Anxiety Difficulty Somewhat difficult Very difficult Somewhat difficult    Assessment & Plan:   Problem List Items Addressed This Visit     Insomnia   Continue trazodone  75mg  nightly.       Adjustment disorder with mixed anxiety and depressed mood - Primary   Chronic, overall doing  better since moving out of house although PHQ9/GAD7 scores similar to last month  Working towards rebuilding relationships with son dodie) and daughter (23yo).  Pending counseling appt next week.  Continue current regimen, increase wellbutrin  SR to 150mg  daily in am.  RTC 4-6 wks f/u visit.  Will offer referral to psychiatry if no improvement with above.       Other Visit Diagnoses       Encounter for immunization       Relevant Orders   Flu vaccine trivalent PF, 6mos and older(Flulaval,Afluria,Fluarix,Fluzone) (Completed)        Meds ordered this encounter  Medications   buPROPion  (WELLBUTRIN  SR) 150 MG 12 hr tablet    Sig: Take 1 tablet (150 mg total) by mouth daily.    Dispense:  30 tablet    Refill:  3    Orders Placed This Encounter  Procedures   Flu vaccine trivalent PF, 6mos and older(Flulaval,Afluria,Fluarix,Fluzone)    Patient Instructions  Flu shot today Increase wellbutrin  to 150mg  SR once daily in the morning.  Continue other medicines. Keep counseling appointment.  Return in 4-6 weeks for follow up visit   Follow up plan: Return in about  6 weeks (around 06/27/2024), or if symptoms worsen or fail to improve, for follow up visit.  Anton Blas, MD   "

## 2024-05-16 NOTE — Patient Instructions (Addendum)
 Flu shot today Increase wellbutrin  to 150mg  SR once daily in the morning.  Continue other medicines. Keep counseling appointment.  Return in 4-6 weeks for follow up visit

## 2024-05-16 NOTE — Assessment & Plan Note (Addendum)
 Chronic, overall doing better since moving out of house although PHQ9/GAD7 scores similar to last month  Working towards rebuilding relationships with son dodie) and daughter (55yo).  Pending counseling appt next week.  Continue current regimen, increase wellbutrin  SR to 150mg  daily in am.  RTC 4-6 wks f/u visit.  Will offer referral to psychiatry if no improvement with above.

## 2024-05-23 ENCOUNTER — Ambulatory Visit (INDEPENDENT_AMBULATORY_CARE_PROVIDER_SITE_OTHER): Admitting: Psychology

## 2024-05-23 DIAGNOSIS — F4323 Adjustment disorder with mixed anxiety and depressed mood: Secondary | ICD-10-CM
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
## 2024-06-02 ENCOUNTER — Ambulatory Visit: Admitting: Psychology

## 2024-06-02 ENCOUNTER — Encounter: Payer: Self-pay | Admitting: Psychology

## 2024-06-02 DIAGNOSIS — F4323 Adjustment disorder with mixed anxiety and depressed mood: Secondary | ICD-10-CM | POA: Diagnosis not present

## 2024-06-02 NOTE — Progress Notes (Signed)
 Behavioral Health Treatment Plan   Name:Andrew Hart [facility] Account   Not on file      MRN: [id number]   Treatment Plan Development Date: 06/02/24   Strengths: Family, Friends, and Spirituality  Supports: Friends and family.    Client Statement of Needs: Toua would like to work on make peace with my kids, navigate divorce, build day-to-day rhythm post separation and divorce. Additional goals include work towards professional goals.    Treatment Level: Individual Therapy  Client Treatment Preferences: Outpatient Therapy.    Diagnosis: Adjustment Disorder  F43.23  Symptoms:  The development of emotional or behavioral symptoms in response to an identifiable stressor(s) occurring within 3 months of the onset of the stressor(s)., These symptoms or behaviors are clinically significant, as evidenced by one or both of the following:Marked distress that is out of proportion to the severity or intensity of the stressor, considering the external context and the cultural factors that might influence symptom severity and presentation., Significant impairment in social, occupational, or other important areas of functioning., The stress-related disturbance does not meet the criteria for another mental disorder and is not merely an exacerbation of a preexisting mental disorder., The symptoms do not represent normal bereavement., and Once the stressor (or its consequences) has terminated, the symptoms do not persist for more than an additional 6 months., and Specify whether:309.28 (F43.23) With mixed anxiety and depressed mood: A combination of depression and anxiety is predominant.  Goals:  Return to previous functioning and reduce or eliminate symptoms., Reduce feelings of sadness and hopelessness thereby improving mood., Reduce anxiety and stress using cognitive and behavioral techniques to manage symptoms., and Improve coping skills by learning effective ways to adapt and manage  anxiety.  Objectives: Target Date For All Objectives: 06/02/25  Enhance problem-solving abilities and develop skills to address and resolve challenges., Promote positive behavior change and encourage healthy and productive activities., Build resilience and learn to adapt to stress and hardships., Stay connected with supportive friends and loved ones, and Live a healthy lifestyle  Progress Documentation:   Progressing  Interventions:  Cognitive Behavioral Therapy, Assertiveness/Communication, Roleplay, and Interpersonal     Expected duration of treatment: Evaluate after 1 year of treatment  Party responsible for implementation of interventions: The patient, TIMITHY ARONS and Therapist, Elvie Mullet, LCSW.  This plan has been reviewed and created by the following participants: The patient, ED MANDICH and Therapist, Elvie Mullet, LCSW.  This plan will be reviewed at least every 12 months.  Status of Treatment Plan Signature:  No, pending signature via MyChart.  Signature:  Elvie Mullet, LCSW

## 2024-06-02 NOTE — Progress Notes (Signed)
 [facility] Counselor/Therapist Progress Note   Patient ID: Andrew Hart, MRN: [id number]   Date: 06/02/24  Time Spent: 2:03  pm - 2:44 pm : 41 Minutes  Treatment Type: Individual Therapy.  Reported Symptoms: Depression and anxiety.   Mental Status Exam: Appearance:  Casual     Behavior: Appropriate  Motor: Normal  Speech/Language:  Normal Rate  Affect: Congruent  Mood: dysthymic  Thought process: normal  Thought content:   WNL  Sensory/Perceptual disturbances:   WNL  Orientation: oriented to person, place, time/date, and situation  Attention: Good  Concentration: Good  Memory: WNL  Fund of knowledge:  Good  Insight:   Good  Judgment:  Good  Impulse Control: Good   Risk Assessment: Danger to Self:  No Self-injurious Behavior: No Danger to Others: No Duty to Warn:no Physical Aggression / Violence:No  Access to Firearms a concern: No  Gang Involvement:No   Subjective:   Andrew Hart participated from home, via video and consented to treatment. Therapist participated from home office. I discussed the limitations of evaluation and management by telemedicine and the availability of in person appointments. The patient expressed understanding and agreed to proceed. Mena reviewed the events of the past week.    We reviewed numerous treatment approaches including CBT, BA, Problem Solving, and Solution focused therapy. Psych-education regarding the Morgen's diagnosis of Adjustment disorder with mixed anxiety and depressed mood was provided during the session. We discussed Andrew Hart goals treatment goals which include work on make peace with my kids, navigate divorce, build day-to-day rhythm post separation and divorce. Additional goals include work towards professional goals. Andrew Hart provided verbal approval of the treatment plan.   Interventions: Psycho-education & Goal Setting.   Diagnosis:  Adjustment disorder with mixed anxiety and  depressed mood  Psychiatric Treatment: Yes , via PCP. See chart.    Andrew Mullet, Andrew Hart       Behavioral Health Treatment Plan   Name:Andrew Hart Account   Not on file      MRN: [id number]   Treatment Plan Development Date: 06/02/24   Strengths: Family, Friends, and Spirituality  Supports: Friends and family.    Client Statement of Needs: Andrew Hart would like to work on make peace with my kids, navigate divorce, build day-to-day rhythm post separation and divorce. Additional goals include work towards professional goals.    Treatment Level: Individual Therapy  Client Treatment Preferences: Outpatient Therapy.    Diagnosis: Adjustment Disorder  F43.23  Symptoms:  The development of emotional or behavioral symptoms in response to an identifiable stressor(s) occurring within 3 months of the onset of the stressor(s)., These symptoms or behaviors are clinically significant, as evidenced by one or both of the following:Marked distress that is out of proportion to the severity or intensity of the stressor, considering the external context and the cultural factors that might influence symptom severity and presentation., Significant impairment in social, occupational, or other important areas of functioning., The stress-related disturbance does not meet the criteria for another mental disorder and is not merely an exacerbation of a preexisting mental disorder., The symptoms do not represent normal bereavement., and Once the stressor (or its consequences) has terminated, the symptoms do not persist for more than an additional 6 months., and Specify whether:309.28 (F43.23) With mixed anxiety and depressed mood: A combination of depression and anxiety is predominant.  Goals:  Return to previous functioning and reduce or eliminate symptoms., Reduce feelings of sadness and hopelessness thereby  improving mood., Reduce anxiety and stress using cognitive and behavioral  techniques to manage symptoms., and Improve coping skills by learning effective ways to adapt and manage anxiety.  Objectives: Target Date For All Objectives: 06/02/25  Enhance problem-solving abilities and develop skills to address and resolve challenges., Promote positive behavior change and encourage healthy and productive activities., Build resilience and learn to adapt to stress and hardships., Stay connected with supportive friends and loved ones, and Live a healthy lifestyle  Progress Documentation:   Progressing  Interventions:  Cognitive Behavioral Therapy, Assertiveness/Communication, Roleplay, and Interpersonal     Expected duration of treatment: Evaluate after 1 year of treatment  Party responsible for implementation of interventions: The patient, Andrew Hart and Therapist, Andrew Mullet, Andrew Hart.  This plan has been reviewed and created by the following participants: The patient, Andrew Hart and Therapist, Andrew Mullet, Andrew Hart.  This plan will be reviewed at least every 12 months.  Status of Treatment Plan Signature:  No, pending signature via MyChart.  Signature:  Andrew Mullet, Andrew Hart

## 2024-06-06 ENCOUNTER — Other Ambulatory Visit: Payer: Self-pay | Admitting: Family Medicine

## 2024-06-06 DIAGNOSIS — F4322 Adjustment disorder with anxiety: Secondary | ICD-10-CM

## 2024-06-07 ENCOUNTER — Encounter: Admitting: Family Medicine

## 2024-06-17 ENCOUNTER — Ambulatory Visit: Admitting: Psychology

## 2024-06-27 ENCOUNTER — Ambulatory Visit: Admitting: Family Medicine
